# Patient Record
Sex: Female | Born: 1965 | Race: Black or African American | Hispanic: No | Marital: Single | Smoking: Never smoker
Health system: Southern US, Community
[De-identification: ages and names within clinical notes are randomized; demographics above are authoritative.]

## PROBLEM LIST (undated history)

## (undated) DIAGNOSIS — Z992 Dependence on renal dialysis: Secondary | ICD-10-CM

## (undated) DIAGNOSIS — D649 Anemia, unspecified: Secondary | ICD-10-CM

## (undated) DIAGNOSIS — I10 Essential (primary) hypertension: Secondary | ICD-10-CM

## (undated) DIAGNOSIS — N189 Chronic kidney disease, unspecified: Secondary | ICD-10-CM

## (undated) DIAGNOSIS — M069 Rheumatoid arthritis, unspecified: Secondary | ICD-10-CM

## (undated) DIAGNOSIS — Z940 Kidney transplant status: Secondary | ICD-10-CM

## (undated) HISTORY — DX: Chronic kidney disease, unspecified: N18.9

## (undated) HISTORY — PX: OTHER SURGICAL HISTORY: SHX169

## (undated) HISTORY — DX: Anemia, unspecified: D64.9

## (undated) HISTORY — DX: Rheumatoid arthritis, unspecified: M06.9

## (undated) HISTORY — DX: Essential (primary) hypertension: I10

---

## 2001-03-15 ENCOUNTER — Ambulatory Visit (HOSPITAL_BASED_OUTPATIENT_CLINIC_OR_DEPARTMENT_OTHER): Admission: RE | Admit: 2001-03-15 | Discharge: 2001-03-15 | Payer: Self-pay | Admitting: General Surgery

## 2001-03-15 ENCOUNTER — Encounter (INDEPENDENT_AMBULATORY_CARE_PROVIDER_SITE_OTHER): Payer: Self-pay | Admitting: *Deleted

## 2001-07-30 ENCOUNTER — Encounter: Admission: RE | Admit: 2001-07-30 | Discharge: 2001-07-30 | Payer: Self-pay | Admitting: Family Medicine

## 2001-12-20 ENCOUNTER — Encounter: Admission: RE | Admit: 2001-12-20 | Discharge: 2001-12-20 | Payer: Self-pay | Admitting: Family Medicine

## 2001-12-21 ENCOUNTER — Encounter: Admission: RE | Admit: 2001-12-21 | Discharge: 2002-01-11 | Payer: Self-pay | Admitting: Orthopedic Surgery

## 2002-02-22 ENCOUNTER — Encounter: Admission: RE | Admit: 2002-02-22 | Discharge: 2002-02-22 | Payer: Self-pay | Admitting: Family Medicine

## 2002-03-18 ENCOUNTER — Encounter: Admission: RE | Admit: 2002-03-18 | Discharge: 2002-03-18 | Payer: Self-pay | Admitting: Family Medicine

## 2002-05-08 ENCOUNTER — Encounter: Admission: RE | Admit: 2002-05-08 | Discharge: 2002-05-08 | Payer: Self-pay | Admitting: Family Medicine

## 2002-07-15 ENCOUNTER — Encounter: Admission: RE | Admit: 2002-07-15 | Discharge: 2002-10-13 | Payer: Self-pay | Admitting: Orthopedic Surgery

## 2002-08-22 ENCOUNTER — Encounter: Admission: RE | Admit: 2002-08-22 | Discharge: 2002-08-22 | Payer: Self-pay | Admitting: Family Medicine

## 2002-08-26 ENCOUNTER — Encounter: Admission: RE | Admit: 2002-08-26 | Discharge: 2002-08-26 | Payer: Self-pay | Admitting: Family Medicine

## 2002-08-26 ENCOUNTER — Other Ambulatory Visit: Admission: RE | Admit: 2002-08-26 | Discharge: 2002-08-26 | Payer: Self-pay | Admitting: Family Medicine

## 2002-08-30 ENCOUNTER — Encounter (HOSPITAL_COMMUNITY): Admission: RE | Admit: 2002-08-30 | Discharge: 2002-11-28 | Payer: Self-pay | Admitting: *Deleted

## 2002-10-14 ENCOUNTER — Encounter: Admission: RE | Admit: 2002-10-14 | Discharge: 2003-01-12 | Payer: Self-pay | Admitting: Orthopedic Surgery

## 2002-11-27 ENCOUNTER — Encounter (HOSPITAL_BASED_OUTPATIENT_CLINIC_OR_DEPARTMENT_OTHER): Payer: Self-pay | Admitting: General Surgery

## 2002-11-28 ENCOUNTER — Ambulatory Visit (HOSPITAL_COMMUNITY): Admission: RE | Admit: 2002-11-28 | Discharge: 2002-11-28 | Payer: Self-pay | Admitting: General Surgery

## 2002-12-06 ENCOUNTER — Encounter (HOSPITAL_COMMUNITY): Admission: RE | Admit: 2002-12-06 | Discharge: 2003-03-06 | Payer: Self-pay | Admitting: Sports Medicine

## 2003-03-07 ENCOUNTER — Encounter (HOSPITAL_COMMUNITY): Admission: RE | Admit: 2003-03-07 | Discharge: 2003-06-05 | Payer: Self-pay | Admitting: Sports Medicine

## 2003-06-06 ENCOUNTER — Encounter (HOSPITAL_COMMUNITY): Admission: RE | Admit: 2003-06-06 | Discharge: 2003-09-04 | Payer: Self-pay | Admitting: Sports Medicine

## 2003-07-02 ENCOUNTER — Encounter: Admission: RE | Admit: 2003-07-02 | Discharge: 2003-07-02 | Payer: Self-pay | Admitting: Family Medicine

## 2003-07-29 ENCOUNTER — Encounter: Admission: RE | Admit: 2003-07-29 | Discharge: 2003-07-29 | Payer: Self-pay | Admitting: Family Medicine

## 2003-08-01 ENCOUNTER — Encounter: Admission: RE | Admit: 2003-08-01 | Discharge: 2003-08-01 | Payer: Self-pay | Admitting: Family Medicine

## 2003-08-15 ENCOUNTER — Encounter: Admission: RE | Admit: 2003-08-15 | Discharge: 2003-08-15 | Payer: Self-pay | Admitting: Family Medicine

## 2003-08-28 ENCOUNTER — Emergency Department (HOSPITAL_COMMUNITY): Admission: EM | Admit: 2003-08-28 | Discharge: 2003-08-28 | Payer: Self-pay | Admitting: Emergency Medicine

## 2003-09-12 ENCOUNTER — Encounter (HOSPITAL_COMMUNITY): Admission: RE | Admit: 2003-09-12 | Discharge: 2003-12-11 | Payer: Self-pay | Admitting: Vascular Surgery

## 2003-09-18 ENCOUNTER — Other Ambulatory Visit: Admission: RE | Admit: 2003-09-18 | Discharge: 2003-09-18 | Payer: Self-pay | Admitting: Family Medicine

## 2003-09-18 ENCOUNTER — Encounter: Admission: RE | Admit: 2003-09-18 | Discharge: 2003-09-18 | Payer: Self-pay | Admitting: Family Medicine

## 2003-09-24 ENCOUNTER — Encounter: Admission: RE | Admit: 2003-09-24 | Discharge: 2003-09-24 | Payer: Self-pay | Admitting: Family Medicine

## 2004-03-29 ENCOUNTER — Encounter: Admission: RE | Admit: 2004-03-29 | Discharge: 2004-03-29 | Payer: Self-pay | Admitting: Family Medicine

## 2004-06-14 ENCOUNTER — Emergency Department (HOSPITAL_COMMUNITY): Admission: EM | Admit: 2004-06-14 | Discharge: 2004-06-15 | Payer: Self-pay | Admitting: Emergency Medicine

## 2004-06-17 ENCOUNTER — Encounter: Admission: RE | Admit: 2004-06-17 | Discharge: 2004-06-17 | Payer: Self-pay | Admitting: Family Medicine

## 2004-08-13 ENCOUNTER — Ambulatory Visit: Payer: Self-pay | Admitting: Family Medicine

## 2004-09-10 ENCOUNTER — Other Ambulatory Visit: Admission: RE | Admit: 2004-09-10 | Discharge: 2004-09-10 | Payer: Self-pay | Admitting: Family Medicine

## 2004-09-10 ENCOUNTER — Ambulatory Visit: Payer: Self-pay | Admitting: Sports Medicine

## 2004-12-21 ENCOUNTER — Ambulatory Visit: Payer: Self-pay | Admitting: Family Medicine

## 2004-12-23 ENCOUNTER — Ambulatory Visit: Payer: Self-pay | Admitting: Family Medicine

## 2004-12-27 ENCOUNTER — Ambulatory Visit: Payer: Self-pay | Admitting: Family Medicine

## 2005-01-06 ENCOUNTER — Ambulatory Visit: Payer: Self-pay | Admitting: Sports Medicine

## 2005-01-19 ENCOUNTER — Ambulatory Visit: Payer: Self-pay | Admitting: Family Medicine

## 2005-02-18 ENCOUNTER — Ambulatory Visit: Payer: Self-pay | Admitting: Family Medicine

## 2005-08-18 ENCOUNTER — Ambulatory Visit: Payer: Self-pay | Admitting: Family Medicine

## 2005-08-19 ENCOUNTER — Ambulatory Visit: Payer: Self-pay | Admitting: Family Medicine

## 2005-08-19 ENCOUNTER — Ambulatory Visit: Payer: Self-pay | Admitting: Cardiology

## 2005-08-19 ENCOUNTER — Inpatient Hospital Stay (HOSPITAL_COMMUNITY): Admission: EM | Admit: 2005-08-19 | Discharge: 2005-08-25 | Payer: Self-pay | Admitting: Emergency Medicine

## 2005-08-19 ENCOUNTER — Encounter: Payer: Self-pay | Admitting: Cardiology

## 2005-09-30 ENCOUNTER — Ambulatory Visit: Payer: Self-pay | Admitting: Family Medicine

## 2005-10-26 ENCOUNTER — Ambulatory Visit: Payer: Self-pay | Admitting: Family Medicine

## 2005-11-07 ENCOUNTER — Ambulatory Visit (HOSPITAL_COMMUNITY): Admission: RE | Admit: 2005-11-07 | Discharge: 2005-11-07 | Payer: Self-pay | Admitting: Vascular Surgery

## 2005-12-15 ENCOUNTER — Ambulatory Visit: Payer: Self-pay | Admitting: Family Medicine

## 2006-03-29 ENCOUNTER — Ambulatory Visit (HOSPITAL_COMMUNITY): Admission: RE | Admit: 2006-03-29 | Discharge: 2006-03-29 | Payer: Self-pay

## 2006-04-03 ENCOUNTER — Encounter: Admission: RE | Admit: 2006-04-03 | Discharge: 2006-04-03 | Payer: Self-pay | Admitting: Nephrology

## 2006-05-30 ENCOUNTER — Encounter: Admission: RE | Admit: 2006-05-30 | Discharge: 2006-05-30 | Payer: Self-pay | Admitting: Nephrology

## 2006-12-08 ENCOUNTER — Ambulatory Visit: Payer: Self-pay | Admitting: Family Medicine

## 2006-12-22 ENCOUNTER — Encounter (INDEPENDENT_AMBULATORY_CARE_PROVIDER_SITE_OTHER): Payer: Self-pay | Admitting: *Deleted

## 2006-12-22 LAB — CONVERTED CEMR LAB

## 2006-12-29 ENCOUNTER — Ambulatory Visit: Payer: Self-pay | Admitting: Family Medicine

## 2006-12-29 ENCOUNTER — Encounter (INDEPENDENT_AMBULATORY_CARE_PROVIDER_SITE_OTHER): Payer: Self-pay | Admitting: Family Medicine

## 2007-01-18 DIAGNOSIS — N912 Amenorrhea, unspecified: Secondary | ICD-10-CM | POA: Insufficient documentation

## 2007-01-18 DIAGNOSIS — K5900 Constipation, unspecified: Secondary | ICD-10-CM | POA: Insufficient documentation

## 2007-01-18 DIAGNOSIS — D649 Anemia, unspecified: Secondary | ICD-10-CM | POA: Insufficient documentation

## 2007-01-18 DIAGNOSIS — J309 Allergic rhinitis, unspecified: Secondary | ICD-10-CM | POA: Insufficient documentation

## 2007-01-18 DIAGNOSIS — M329 Systemic lupus erythematosus, unspecified: Secondary | ICD-10-CM | POA: Insufficient documentation

## 2007-01-19 ENCOUNTER — Encounter (INDEPENDENT_AMBULATORY_CARE_PROVIDER_SITE_OTHER): Payer: Self-pay | Admitting: *Deleted

## 2007-04-26 ENCOUNTER — Encounter (INDEPENDENT_AMBULATORY_CARE_PROVIDER_SITE_OTHER): Payer: Self-pay | Admitting: Family Medicine

## 2007-04-26 ENCOUNTER — Encounter: Admission: RE | Admit: 2007-04-26 | Discharge: 2007-04-26 | Payer: Self-pay | Admitting: Nephrology

## 2007-05-09 ENCOUNTER — Encounter (INDEPENDENT_AMBULATORY_CARE_PROVIDER_SITE_OTHER): Payer: Self-pay | Admitting: Family Medicine

## 2008-02-20 ENCOUNTER — Encounter: Payer: Self-pay | Admitting: Family Medicine

## 2008-02-22 ENCOUNTER — Encounter: Payer: Self-pay | Admitting: Family Medicine

## 2008-02-26 ENCOUNTER — Encounter: Payer: Self-pay | Admitting: Family Medicine

## 2008-02-28 ENCOUNTER — Encounter: Payer: Self-pay | Admitting: Family Medicine

## 2008-03-03 ENCOUNTER — Encounter: Payer: Self-pay | Admitting: Family Medicine

## 2008-03-04 ENCOUNTER — Encounter: Payer: Self-pay | Admitting: Family Medicine

## 2008-03-06 ENCOUNTER — Encounter: Payer: Self-pay | Admitting: Family Medicine

## 2008-03-07 ENCOUNTER — Telehealth (INDEPENDENT_AMBULATORY_CARE_PROVIDER_SITE_OTHER): Payer: Self-pay | Admitting: Family Medicine

## 2008-03-10 ENCOUNTER — Encounter: Payer: Self-pay | Admitting: Family Medicine

## 2008-03-11 ENCOUNTER — Ambulatory Visit: Payer: Self-pay | Admitting: Family Medicine

## 2008-03-11 ENCOUNTER — Encounter: Payer: Self-pay | Admitting: Family Medicine

## 2008-03-12 ENCOUNTER — Encounter: Payer: Self-pay | Admitting: Family Medicine

## 2008-03-21 ENCOUNTER — Encounter: Payer: Self-pay | Admitting: Family Medicine

## 2008-03-31 ENCOUNTER — Encounter: Payer: Self-pay | Admitting: Family Medicine

## 2008-04-03 ENCOUNTER — Encounter: Payer: Self-pay | Admitting: Family Medicine

## 2008-04-07 ENCOUNTER — Encounter: Payer: Self-pay | Admitting: Family Medicine

## 2008-04-10 ENCOUNTER — Encounter: Payer: Self-pay | Admitting: Family Medicine

## 2008-04-22 ENCOUNTER — Encounter: Payer: Self-pay | Admitting: Family Medicine

## 2008-04-25 ENCOUNTER — Encounter: Payer: Self-pay | Admitting: Family Medicine

## 2008-05-08 ENCOUNTER — Encounter: Payer: Self-pay | Admitting: Family Medicine

## 2008-05-12 ENCOUNTER — Encounter: Payer: Self-pay | Admitting: Family Medicine

## 2008-05-21 ENCOUNTER — Encounter: Payer: Self-pay | Admitting: Family Medicine

## 2008-05-22 ENCOUNTER — Encounter: Payer: Self-pay | Admitting: Family Medicine

## 2008-05-27 ENCOUNTER — Encounter: Payer: Self-pay | Admitting: Family Medicine

## 2008-05-30 ENCOUNTER — Encounter: Payer: Self-pay | Admitting: Family Medicine

## 2008-06-20 ENCOUNTER — Encounter: Payer: Self-pay | Admitting: Family Medicine

## 2008-07-24 ENCOUNTER — Encounter: Payer: Self-pay | Admitting: Family Medicine

## 2008-07-31 ENCOUNTER — Encounter: Payer: Self-pay | Admitting: Family Medicine

## 2008-08-07 ENCOUNTER — Encounter: Payer: Self-pay | Admitting: Family Medicine

## 2008-10-15 ENCOUNTER — Encounter: Admission: RE | Admit: 2008-10-15 | Discharge: 2008-10-15 | Payer: Self-pay | Admitting: Nephrology

## 2008-12-22 ENCOUNTER — Encounter (HOSPITAL_COMMUNITY): Admission: RE | Admit: 2008-12-22 | Discharge: 2009-03-22 | Payer: Self-pay | Admitting: Nephrology

## 2009-02-06 ENCOUNTER — Encounter: Payer: Self-pay | Admitting: Family Medicine

## 2009-04-02 ENCOUNTER — Encounter (HOSPITAL_COMMUNITY): Admission: RE | Admit: 2009-04-02 | Discharge: 2009-07-01 | Payer: Self-pay | Admitting: Nephrology

## 2009-04-17 ENCOUNTER — Encounter: Admission: RE | Admit: 2009-04-17 | Discharge: 2009-04-17 | Payer: Self-pay | Admitting: Nephrology

## 2009-07-06 ENCOUNTER — Encounter (HOSPITAL_COMMUNITY): Admission: RE | Admit: 2009-07-06 | Discharge: 2009-10-04 | Payer: Self-pay | Admitting: Nephrology

## 2009-08-14 ENCOUNTER — Encounter: Payer: Self-pay | Admitting: Family Medicine

## 2009-10-12 ENCOUNTER — Encounter (HOSPITAL_COMMUNITY): Admission: RE | Admit: 2009-10-12 | Discharge: 2010-01-08 | Payer: Self-pay | Admitting: Nephrology

## 2010-01-11 ENCOUNTER — Encounter (HOSPITAL_COMMUNITY): Admission: RE | Admit: 2010-01-11 | Discharge: 2010-04-11 | Payer: Self-pay | Admitting: Nephrology

## 2010-02-01 ENCOUNTER — Encounter: Payer: Self-pay | Admitting: Family Medicine

## 2010-04-22 ENCOUNTER — Encounter (HOSPITAL_COMMUNITY): Admission: RE | Admit: 2010-04-22 | Discharge: 2010-07-21 | Payer: Self-pay | Admitting: Nephrology

## 2010-07-22 ENCOUNTER — Encounter (HOSPITAL_COMMUNITY): Admission: RE | Admit: 2010-07-22 | Discharge: 2010-10-20 | Payer: Self-pay | Admitting: Nephrology

## 2010-10-28 ENCOUNTER — Encounter (HOSPITAL_COMMUNITY)
Admission: RE | Admit: 2010-10-28 | Discharge: 2010-12-21 | Payer: Self-pay | Source: Home / Self Care | Attending: Nephrology | Admitting: Nephrology

## 2010-12-12 ENCOUNTER — Encounter: Payer: Self-pay | Admitting: Nephrology

## 2010-12-13 ENCOUNTER — Other Ambulatory Visit: Payer: Self-pay | Admitting: Nephrology

## 2010-12-13 DIAGNOSIS — Z1239 Encounter for other screening for malignant neoplasm of breast: Secondary | ICD-10-CM

## 2010-12-15 LAB — POCT HEMOGLOBIN-HEMACUE: Hemoglobin: 10.8 g/dL — ABNORMAL LOW (ref 12.0–15.0)

## 2010-12-21 NOTE — Consult Note (Signed)
Summary: [facility]   Imported By: Beryle Lathe 02/03/2010 17:18:36  _____________________________________________________________________  External Attachment:    Type:   Image     Comment:   External Document

## 2010-12-24 ENCOUNTER — Ambulatory Visit: Payer: Self-pay

## 2010-12-30 ENCOUNTER — Other Ambulatory Visit: Payer: Self-pay

## 2010-12-30 ENCOUNTER — Encounter (HOSPITAL_COMMUNITY): Payer: Medicare Other | Attending: Nephrology

## 2010-12-30 DIAGNOSIS — N184 Chronic kidney disease, stage 4 (severe): Secondary | ICD-10-CM | POA: Insufficient documentation

## 2010-12-30 DIAGNOSIS — D638 Anemia in other chronic diseases classified elsewhere: Secondary | ICD-10-CM | POA: Insufficient documentation

## 2010-12-30 LAB — POCT HEMOGLOBIN-HEMACUE: Hemoglobin: 11.5 g/dL — ABNORMAL LOW (ref 12.0–15.0)

## 2011-01-13 ENCOUNTER — Encounter (HOSPITAL_COMMUNITY): Payer: Medicare Other

## 2011-01-13 ENCOUNTER — Other Ambulatory Visit: Payer: Self-pay

## 2011-01-27 ENCOUNTER — Encounter (HOSPITAL_COMMUNITY): Payer: Medicare Other

## 2011-01-28 ENCOUNTER — Encounter (HOSPITAL_COMMUNITY): Payer: Medicare Other | Attending: Nephrology

## 2011-01-28 ENCOUNTER — Other Ambulatory Visit: Payer: Self-pay

## 2011-01-28 DIAGNOSIS — N184 Chronic kidney disease, stage 4 (severe): Secondary | ICD-10-CM | POA: Insufficient documentation

## 2011-01-28 DIAGNOSIS — D638 Anemia in other chronic diseases classified elsewhere: Secondary | ICD-10-CM | POA: Insufficient documentation

## 2011-01-31 LAB — POCT HEMOGLOBIN-HEMACUE
Hemoglobin: 11.9 g/dL — ABNORMAL LOW (ref 12.0–15.0)
Hemoglobin: 11.7 g/dL — ABNORMAL LOW (ref 12.0–15.0)

## 2011-02-02 LAB — POCT HEMOGLOBIN-HEMACUE: Hemoglobin: 10.9 g/dL — ABNORMAL LOW (ref 12.0–15.0)

## 2011-02-03 LAB — POCT HEMOGLOBIN-HEMACUE
Hemoglobin: 12 g/dL (ref 12.0–15.0)
Hemoglobin: 11.1 g/dL — ABNORMAL LOW (ref 12.0–15.0)

## 2011-02-05 LAB — COMPREHENSIVE METABOLIC PANEL
ALT: 16 U/L (ref 0–35)
AST: 25 U/L (ref 0–37)
Alkaline Phosphatase: 39 U/L (ref 39–117)
CO2: 25 mEq/L (ref 19–32)
Chloride: 104 mEq/L (ref 96–112)
Creatinine, Ser: 3.4 mg/dL — ABNORMAL HIGH (ref 0.4–1.2)
GFR calc Af Amer: 18 mL/min — ABNORMAL LOW (ref >60)
GFR calc non Af Amer: 15 mL/min — ABNORMAL LOW (ref >60)
Total Bilirubin: 0.6 mg/dL (ref 0.3–1.2)

## 2011-02-06 LAB — POCT HEMOGLOBIN-HEMACUE: Hemoglobin: 10.8 g/dL — ABNORMAL LOW (ref 12.0–15.0)

## 2011-02-08 LAB — POCT HEMOGLOBIN-HEMACUE
Hemoglobin: 10 g/dL — ABNORMAL LOW (ref 12.0–15.0)
Hemoglobin: 9.9 g/dL — ABNORMAL LOW (ref 12.0–15.0)

## 2011-02-09 LAB — POCT HEMOGLOBIN-HEMACUE
Hemoglobin: 10.8 g/dL — ABNORMAL LOW (ref 12.0–15.0)
Hemoglobin: 11 g/dL — ABNORMAL LOW (ref 12.0–15.0)
Hemoglobin: 13.8 g/dL (ref 12.0–15.0)

## 2011-02-10 ENCOUNTER — Encounter (HOSPITAL_COMMUNITY): Payer: Medicare Other

## 2011-02-10 ENCOUNTER — Other Ambulatory Visit: Payer: Self-pay | Admitting: Nephrology

## 2011-02-11 LAB — POCT HEMOGLOBIN-HEMACUE: Hemoglobin: 10.4 g/dL — ABNORMAL LOW (ref 12.0–15.0)

## 2011-02-16 LAB — HEMOGLOBIN A1C: Hgb A1c MFr Bld: 4.8 % (ref 4.0–6.0)

## 2011-02-17 ENCOUNTER — Encounter: Payer: Self-pay | Admitting: Family Medicine

## 2011-02-21 LAB — POCT HEMOGLOBIN-HEMACUE: Hemoglobin: 10.2 g/dL — ABNORMAL LOW (ref 12.0–15.0)

## 2011-02-24 ENCOUNTER — Encounter (HOSPITAL_COMMUNITY): Payer: Medicare Other | Attending: Nephrology

## 2011-02-24 ENCOUNTER — Other Ambulatory Visit: Payer: Self-pay | Admitting: Nephrology

## 2011-02-24 DIAGNOSIS — N184 Chronic kidney disease, stage 4 (severe): Secondary | ICD-10-CM | POA: Insufficient documentation

## 2011-02-24 DIAGNOSIS — D638 Anemia in other chronic diseases classified elsewhere: Secondary | ICD-10-CM | POA: Insufficient documentation

## 2011-02-24 LAB — IRON AND TIBC
Iron: 51 ug/dL (ref 42–135)
Saturation Ratios: 28 % (ref 20–55)
TIBC: 183 ug/dL — ABNORMAL LOW (ref 250–470)
UIBC: 132 ug/dL

## 2011-02-24 LAB — FERRITIN: Ferritin: 413 ng/mL — ABNORMAL HIGH (ref 10–291)

## 2011-02-24 LAB — POCT HEMOGLOBIN-HEMACUE: Hemoglobin: 10.7 g/dL — ABNORMAL LOW (ref 12.0–15.0)

## 2011-02-25 LAB — EPSTEIN BARR VIRUS(EBV) BY PCR: EBV DNA, PCR: NOT DETECTED

## 2011-02-25 LAB — PROTEIN / CREATININE RATIO, URINE
Protein Creatinine Ratio: 0.77 — ABNORMAL HIGH (ref 0.00–0.15)
Total Protein, Urine: 95 mg/dL

## 2011-02-25 LAB — CYTOMEGALOVIRUS PCR, QUALITATIVE: Cytomegalovirus DNA: NOT DETECTED

## 2011-02-25 LAB — SIROLIMUS LEVEL: Sirolimus (Rapamycin): 4.9 (ref 3.0–18.0)

## 2011-02-25 LAB — CBC
HCT: 35.2 % — ABNORMAL LOW (ref 36.0–46.0)
MCHC: 32.7 g/dL (ref 30.0–36.0)
MCV: 86.3 fL (ref 78.0–100.0)
Platelets: 228 10*3/uL (ref 150–400)
RDW: 14.5 % (ref 11.5–15.5)
WBC: 2.8 10*3/uL — ABNORMAL LOW (ref 4.0–10.5)

## 2011-02-25 LAB — URINALYSIS, ROUTINE W REFLEX MICROSCOPIC
Bilirubin Urine: NEGATIVE
Ketones, ur: NEGATIVE mg/dL
Nitrite: NEGATIVE
Protein, ur: 100 mg/dL — AB
Urobilinogen, UA: 0.2 mg/dL (ref 0.0–1.0)

## 2011-02-25 LAB — DIFFERENTIAL
Basophils Absolute: 0 10*3/uL (ref 0.0–0.1)
Lymphocytes Relative: 13 % (ref 12–46)
Lymphs Abs: 0.4 10*3/uL — ABNORMAL LOW (ref 0.7–4.0)
Monocytes Absolute: 0.1 10*3/uL (ref 0.1–1.0)
Neutro Abs: 2.2 10*3/uL (ref 1.7–7.7)

## 2011-02-25 LAB — COMPREHENSIVE METABOLIC PANEL
Albumin: 3.6 g/dL (ref 3.5–5.2)
BUN: 28 mg/dL — ABNORMAL HIGH (ref 6–23)
Calcium: 8.9 mg/dL (ref 8.4–10.5)
Chloride: 106 mEq/L (ref 96–112)
Creatinine, Ser: 3.03 mg/dL — ABNORMAL HIGH (ref 0.4–1.2)
GFR calc Af Amer: 20 mL/min — ABNORMAL LOW (ref >60)
Total Bilirubin: 0.6 mg/dL (ref 0.3–1.2)

## 2011-02-25 LAB — LIPID PANEL
Cholesterol: 174 mg/dL (ref 0–200)
HDL: 57 mg/dL (ref >39)
LDL Cholesterol: 93 mg/dL (ref 0–99)

## 2011-02-25 LAB — FERRITIN: Ferritin: 485 ng/mL — ABNORMAL HIGH (ref 10–291)

## 2011-02-25 LAB — MISCELLANEOUS TEST

## 2011-02-26 LAB — CBC
HCT: 33.4 % — ABNORMAL LOW (ref 36.0–46.0)
Hemoglobin: 11 g/dL — ABNORMAL LOW (ref 12.0–15.0)
Platelets: 243 10*3/uL (ref 150–400)
RBC: 3.87 MIL/uL (ref 3.87–5.11)
WBC: 2.9 10*3/uL — ABNORMAL LOW (ref 4.0–10.5)

## 2011-02-26 LAB — COMPREHENSIVE METABOLIC PANEL
Albumin: 3.8 g/dL (ref 3.5–5.2)
Alkaline Phosphatase: 34 U/L — ABNORMAL LOW (ref 39–117)
BUN: 25 mg/dL — ABNORMAL HIGH (ref 6–23)
Chloride: 107 mEq/L (ref 96–112)
Glucose, Bld: 77 mg/dL (ref 70–99)
Potassium: 4 mEq/L (ref 3.5–5.1)
Total Bilirubin: 0.8 mg/dL (ref 0.3–1.2)

## 2011-02-26 LAB — FERRITIN: Ferritin: 484 ng/mL — ABNORMAL HIGH (ref 10–291)

## 2011-02-26 LAB — URINALYSIS, ROUTINE W REFLEX MICROSCOPIC
Bilirubin Urine: NEGATIVE
Glucose, UA: NEGATIVE mg/dL
Hgb urine dipstick: NEGATIVE
Ketones, ur: NEGATIVE mg/dL
Protein, ur: 100 mg/dL — AB
Urobilinogen, UA: 1 mg/dL (ref 0.0–1.0)

## 2011-02-26 LAB — IRON AND TIBC
Iron: 54 ug/dL (ref 42–135)
Saturation Ratios: 32 % (ref 20–55)
UIBC: 117 ug/dL

## 2011-02-26 LAB — LIPID PANEL: Triglycerides: 106 mg/dL (ref <150)

## 2011-02-26 LAB — MISCELLANEOUS TEST

## 2011-02-26 LAB — DIFFERENTIAL
Basophils Relative: 1 % (ref 0–1)
Lymphs Abs: 0.4 10*3/uL — ABNORMAL LOW (ref 0.7–4.0)
Monocytes Absolute: 0.1 10*3/uL (ref 0.1–1.0)
Monocytes Relative: 5 % (ref 3–12)
Neutro Abs: 2.3 10*3/uL (ref 1.7–7.7)

## 2011-02-26 LAB — SIROLIMUS LEVEL: Sirolimus (Rapamycin): 6.4 (ref 3.0–18.0)

## 2011-02-26 LAB — PTH, INTACT AND CALCIUM
Calcium, Total (PTH): 8.7 mg/dL (ref 8.4–10.5)
PTH: 432.5 pg/mL — ABNORMAL HIGH (ref 14.0–72.0)

## 2011-02-26 LAB — CYTOMEGALOVIRUS PCR, QUALITATIVE: Cytomegalovirus DNA: NOT DETECTED

## 2011-02-26 LAB — PROTEIN / CREATININE RATIO, URINE
Creatinine, Urine: 145.7 mg/dL
Total Protein, Urine: 112 mg/dL

## 2011-02-26 LAB — URINE MICROSCOPIC-ADD ON

## 2011-02-27 LAB — CBC
HCT: 31.4 % — ABNORMAL LOW (ref 36.0–46.0)
MCHC: 31.9 g/dL (ref 30.0–36.0)
MCV: 86.7 fL (ref 78.0–100.0)
Platelets: 223 10*3/uL (ref 150–400)

## 2011-02-27 LAB — URINALYSIS, ROUTINE W REFLEX MICROSCOPIC
Glucose, UA: NEGATIVE mg/dL
Leukocytes, UA: NEGATIVE
Nitrite: NEGATIVE
Specific Gravity, Urine: 1.016 (ref 1.005–1.030)
pH: 6.5 (ref 5.0–8.0)

## 2011-02-27 LAB — URINE MICROSCOPIC-ADD ON

## 2011-02-27 LAB — POCT HEMOGLOBIN-HEMACUE: Hemoglobin: 8.7 g/dL — ABNORMAL LOW (ref 12.0–15.0)

## 2011-02-27 LAB — COMPREHENSIVE METABOLIC PANEL
AST: 17 U/L (ref 0–37)
Albumin: 3.8 g/dL (ref 3.5–5.2)
BUN: 37 mg/dL — ABNORMAL HIGH (ref 6–23)
CO2: 25 mEq/L (ref 19–32)
Calcium: 9.3 mg/dL (ref 8.4–10.5)
Chloride: 106 mEq/L (ref 96–112)
Creatinine, Ser: 3.15 mg/dL — ABNORMAL HIGH (ref 0.4–1.2)
GFR calc Af Amer: 20 mL/min — ABNORMAL LOW (ref >60)
GFR calc non Af Amer: 16 mL/min — ABNORMAL LOW (ref >60)
Total Bilirubin: 0.3 mg/dL (ref 0.3–1.2)

## 2011-02-27 LAB — DIFFERENTIAL
Basophils Absolute: 0 10*3/uL (ref 0.0–0.1)
Basophils Relative: 0 % (ref 0–1)
Lymphocytes Relative: 12 % (ref 12–46)
Monocytes Absolute: 0.1 10*3/uL (ref 0.1–1.0)
Neutro Abs: 3.5 10*3/uL (ref 1.7–7.7)
Neutrophils Relative %: 83 % — ABNORMAL HIGH (ref 43–77)

## 2011-02-27 LAB — EPSTEIN BARR VIRUS(EBV) BY PCR

## 2011-02-27 LAB — MISCELLANEOUS TEST

## 2011-02-27 LAB — IRON AND TIBC
Iron: 65 ug/dL (ref 42–135)
TIBC: 202 ug/dL — ABNORMAL LOW (ref 250–470)

## 2011-02-28 LAB — COMPREHENSIVE METABOLIC PANEL
AST: 43 U/L — ABNORMAL HIGH (ref 0–37)
CO2: 15 mEq/L — ABNORMAL LOW (ref 19–32)
Calcium: 8.9 mg/dL (ref 8.4–10.5)
Creatinine, Ser: 3.57 mg/dL — ABNORMAL HIGH (ref 0.4–1.2)
GFR calc Af Amer: 17 mL/min — ABNORMAL LOW (ref >60)
GFR calc non Af Amer: 14 mL/min — ABNORMAL LOW (ref >60)

## 2011-02-28 LAB — IRON AND TIBC: Saturation Ratios: 16 % — ABNORMAL LOW (ref 20–55)

## 2011-02-28 LAB — URINALYSIS, ROUTINE W REFLEX MICROSCOPIC
Protein, ur: 100 mg/dL — AB
Urobilinogen, UA: 0.2 mg/dL (ref 0.0–1.0)

## 2011-02-28 LAB — HEMOGLOBIN A1C
Hgb A1c MFr Bld: 5.3 % (ref 4.6–6.1)
Mean Plasma Glucose: 105 mg/dL

## 2011-02-28 LAB — DIFFERENTIAL
Basophils Absolute: 0 10*3/uL (ref 0.0–0.1)
Basophils Relative: 0 % (ref 0–1)
Eosinophils Absolute: 0.2 10*3/uL (ref 0.0–0.7)
Neutrophils Relative %: 84 % — ABNORMAL HIGH (ref 43–77)

## 2011-02-28 LAB — CYTOMEGALOVIRUS ANTIBODY, IGG: Cytomegalovirus(CMV) Antibody, IgG: POSITIVE — AB

## 2011-02-28 LAB — SIROLIMUS LEVEL: Sirolimus (Rapamycin): 6.5 (ref 3.0–18.0)

## 2011-02-28 LAB — LIPID PANEL
HDL: 48 mg/dL (ref >39)
Triglycerides: 223 mg/dL — ABNORMAL HIGH (ref <150)

## 2011-02-28 LAB — CBC
MCHC: 33 g/dL (ref 30.0–36.0)
MCV: 83.5 fL (ref 78.0–100.0)
Platelets: 317 10*3/uL (ref 150–400)
WBC: 3.6 10*3/uL — ABNORMAL LOW (ref 4.0–10.5)

## 2011-02-28 LAB — VITAMIN D 25 HYDROXY (VIT D DEFICIENCY, FRACTURES): Vit D, 25-Hydroxy: 14 ng/mL — ABNORMAL LOW (ref 30–89)

## 2011-02-28 LAB — MISCELLANEOUS TEST

## 2011-02-28 LAB — URINE MICROSCOPIC-ADD ON

## 2011-02-28 LAB — EPSTEIN BARR VIRUS(EBV) BY PCR: EBV DNA, PCR: NOT DETECTED

## 2011-03-01 LAB — CBC
MCHC: 32.9 g/dL (ref 30.0–36.0)
Platelets: 257 10*3/uL (ref 150–400)
RBC: 3.94 MIL/uL (ref 3.87–5.11)
WBC: 3.4 10*3/uL — ABNORMAL LOW (ref 4.0–10.5)

## 2011-03-01 LAB — COMPREHENSIVE METABOLIC PANEL
ALT: 17 U/L (ref 0–35)
AST: 22 U/L (ref 0–37)
Albumin: 3.6 g/dL (ref 3.5–5.2)
CO2: 23 mEq/L (ref 19–32)
Calcium: 9.1 mg/dL (ref 8.4–10.5)
Chloride: 107 mEq/L (ref 96–112)
GFR calc Af Amer: 23 mL/min — ABNORMAL LOW (ref >60)
GFR calc non Af Amer: 19 mL/min — ABNORMAL LOW (ref >60)
Sodium: 136 mEq/L (ref 135–145)

## 2011-03-01 LAB — CYTOMEGALOVIRUS PCR, QUALITATIVE: Cytomegalovirus DNA: NOT DETECTED

## 2011-03-01 LAB — URINALYSIS, MICROSCOPIC ONLY
Leukocytes, UA: NEGATIVE
Nitrite: NEGATIVE
Protein, ur: >300 mg/dL — AB
Specific Gravity, Urine: 1.016 (ref 1.005–1.030)
Urobilinogen, UA: 0.2 mg/dL (ref 0.0–1.0)

## 2011-03-01 LAB — DIFFERENTIAL
Eosinophils Absolute: 0.1 10*3/uL (ref 0.0–0.7)
Eosinophils Relative: 2 % (ref 0–5)
Lymphs Abs: 0.3 10*3/uL — ABNORMAL LOW (ref 0.7–4.0)
Monocytes Absolute: 0.2 10*3/uL (ref 0.1–1.0)

## 2011-03-01 LAB — MISCELLANEOUS TEST

## 2011-03-01 LAB — EPSTEIN BARR VIRUS(EBV) BY PCR: EBV DNA, PCR: NOT DETECTED

## 2011-03-02 LAB — IRON AND TIBC
Iron: 87 ug/dL (ref 42–135)
Saturation Ratios: 43 % (ref 20–55)
TIBC: 202 ug/dL — ABNORMAL LOW (ref 250–470)

## 2011-03-02 LAB — CBC
HCT: 38.5 % (ref 36.0–46.0)
MCHC: 32.2 g/dL (ref 30.0–36.0)
MCV: 83.7 fL (ref 78.0–100.0)
Platelets: 265 10*3/uL (ref 150–400)
RDW: 15.6 % — ABNORMAL HIGH (ref 11.5–15.5)

## 2011-03-02 LAB — COMPREHENSIVE METABOLIC PANEL
AST: 22 U/L (ref 0–37)
Albumin: 4 g/dL (ref 3.5–5.2)
BUN: 24 mg/dL — ABNORMAL HIGH (ref 6–23)
Calcium: 9.5 mg/dL (ref 8.4–10.5)
Chloride: 107 mEq/L (ref 96–112)
Creatinine, Ser: 2.55 mg/dL — ABNORMAL HIGH (ref 0.4–1.2)
GFR calc Af Amer: 25 mL/min — ABNORMAL LOW (ref >60)
Total Bilirubin: 0.7 mg/dL (ref 0.3–1.2)

## 2011-03-02 LAB — PROTEIN / CREATININE RATIO, URINE
Creatinine, Urine: 125.8 mg/dL
Protein Creatinine Ratio: 1.21 — ABNORMAL HIGH (ref 0.00–0.15)
Total Protein, Urine: 152 mg/dL

## 2011-03-02 LAB — POCT HEMOGLOBIN-HEMACUE
Hemoglobin: 11.4 g/dL — ABNORMAL LOW (ref 12.0–15.0)
Hemoglobin: 11.4 g/dL — ABNORMAL LOW (ref 12.0–15.0)

## 2011-03-02 LAB — LIPID PANEL
LDL Cholesterol: 109 mg/dL — ABNORMAL HIGH (ref 0–99)
Total CHOL/HDL Ratio: 3.4 RATIO
Triglycerides: 127 mg/dL (ref <150)
VLDL: 25 mg/dL (ref 0–40)

## 2011-03-02 LAB — DIFFERENTIAL
Basophils Relative: 5 % — ABNORMAL HIGH (ref 0–1)
Eosinophils Absolute: 0.1 10*3/uL (ref 0.0–0.7)
Lymphs Abs: 0.3 10*3/uL — ABNORMAL LOW (ref 0.7–4.0)
Monocytes Relative: 3 % (ref 3–12)
Neutro Abs: 3.3 10*3/uL (ref 1.7–7.7)
Neutrophils Relative %: 82 % — ABNORMAL HIGH (ref 43–77)

## 2011-03-02 LAB — SIROLIMUS LEVEL: Sirolimus (Rapamycin): 11 (ref 3.0–18.0)

## 2011-03-02 LAB — FERRITIN: Ferritin: 552 ng/mL — ABNORMAL HIGH (ref 10–291)

## 2011-03-02 LAB — CYTOMEGALOVIRUS PCR, QUALITATIVE

## 2011-03-03 LAB — POCT HEMOGLOBIN-HEMACUE: Hemoglobin: 11.8 g/dL — ABNORMAL LOW (ref 12.0–15.0)

## 2011-03-08 LAB — DIFFERENTIAL
Eosinophils Absolute: 0.1 10*3/uL (ref 0.0–0.7)
Lymphocytes Relative: 17 % (ref 12–46)
Lymphs Abs: 0.5 10*3/uL — ABNORMAL LOW (ref 0.7–4.0)
Monocytes Relative: 6 % (ref 3–12)
Neutro Abs: 2 10*3/uL (ref 1.7–7.7)
Neutrophils Relative %: 73 % (ref 43–77)

## 2011-03-08 LAB — URINALYSIS, ROUTINE W REFLEX MICROSCOPIC
Bilirubin Urine: NEGATIVE
Leukocytes, UA: NEGATIVE
Nitrite: NEGATIVE
Specific Gravity, Urine: 1.02 (ref 1.005–1.030)
Urobilinogen, UA: 0.2 mg/dL (ref 0.0–1.0)
pH: 6.5 (ref 5.0–8.0)

## 2011-03-08 LAB — URINE MICROSCOPIC-ADD ON

## 2011-03-08 LAB — COMPREHENSIVE METABOLIC PANEL
ALT: 14 U/L (ref 0–35)
AST: 22 U/L (ref 0–37)
Alkaline Phosphatase: 40 U/L (ref 39–117)
CO2: 24 mEq/L (ref 19–32)
Calcium: 9 mg/dL (ref 8.4–10.5)
Chloride: 104 mEq/L (ref 96–112)
GFR calc Af Amer: 21 mL/min — ABNORMAL LOW (ref >60)
GFR calc non Af Amer: 18 mL/min — ABNORMAL LOW (ref >60)
Glucose, Bld: 75 mg/dL (ref 70–99)
Potassium: 3.8 mEq/L (ref 3.5–5.1)
Sodium: 135 mEq/L (ref 135–145)
Total Bilirubin: 0.3 mg/dL (ref 0.3–1.2)

## 2011-03-08 LAB — SIROLIMUS LEVEL: Sirolimus (Rapamycin): 10.5 (ref 3.0–18.0)

## 2011-03-08 LAB — CBC
HCT: 28.2 % — ABNORMAL LOW (ref 36.0–46.0)
Hemoglobin: 9.3 g/dL — ABNORMAL LOW (ref 12.0–15.0)
MCV: 85.1 fL (ref 78.0–100.0)
RBC: 3.32 MIL/uL — ABNORMAL LOW (ref 3.87–5.11)
WBC: 2.8 10*3/uL — ABNORMAL LOW (ref 4.0–10.5)

## 2011-03-08 LAB — IRON AND TIBC
Iron: 31 ug/dL — ABNORMAL LOW (ref 42–135)
UIBC: 170 ug/dL

## 2011-03-08 LAB — HEMOGLOBIN A1C: Mean Plasma Glucose: 80 mg/dL

## 2011-03-10 ENCOUNTER — Other Ambulatory Visit: Payer: Self-pay | Admitting: Nephrology

## 2011-03-10 ENCOUNTER — Encounter (HOSPITAL_COMMUNITY): Payer: Medicare Other

## 2011-03-24 ENCOUNTER — Encounter (HOSPITAL_COMMUNITY): Payer: Medicare Other | Attending: Nephrology

## 2011-03-24 ENCOUNTER — Other Ambulatory Visit: Payer: Self-pay | Admitting: Nephrology

## 2011-03-24 DIAGNOSIS — N184 Chronic kidney disease, stage 4 (severe): Secondary | ICD-10-CM | POA: Insufficient documentation

## 2011-03-24 DIAGNOSIS — D638 Anemia in other chronic diseases classified elsewhere: Secondary | ICD-10-CM | POA: Insufficient documentation

## 2011-03-24 LAB — POCT HEMOGLOBIN-HEMACUE: Hemoglobin: 12.3 g/dL (ref 12.0–15.0)

## 2011-04-07 ENCOUNTER — Encounter (HOSPITAL_COMMUNITY): Payer: Medicare Other

## 2011-04-07 ENCOUNTER — Other Ambulatory Visit: Payer: Self-pay | Admitting: Nephrology

## 2011-04-08 LAB — POCT HEMOGLOBIN-HEMACUE: Hemoglobin: 10.8 g/dL — ABNORMAL LOW (ref 12.0–15.0)

## 2011-04-08 NOTE — Op Note (Signed)
[name], [name] NO.:  [id number]   MEDICAL RECORD NO.:  [id number]          PATIENT TYPE:  INP   LOCATION:  5524                         FACILITY:  [facility]   PHYSICIAN:  Jessy Oto. Fields, MD  DATE OF BIRTH:  02/28/1966   DATE OF PROCEDURE:  DATE OF DISCHARGE:                                 OPERATIVE REPORT   PROCEDURE:  Left radial cephalic AV fistula.   PREOPERATIVE DIAGNOSIS:  Stage renal disease.   POSTOPERATIVE DIAGNOSIS:  Stage renal disease.   ANESTHESIA:  Local with IV sedation.   ASSISTANT:  Nurse.   OPERATIVE FINDINGS:  1.  2.5 mm cephalic vein.  2.  2 mm radial artery.   OPERATIVE DETAILS:  After obtaining informed consent, the patient taken to  the operating room. The patient placed in supine position on operating  table. After adequate sedation, the patient's entire left upper extremity  was prepped and draped usual sterile fashion. Local anesthesia was  infiltrated midway between the cephalic vein and the radial artery at the  wrist. Longitudinal incision was made in this location, carried down through  subcutaneous tissues down to the level of the cephalic vein. Cephalic vein  was dissected free circumferentially, small side branches ligated and  divided between silk ties. Next, a radial artery was dissected free in the  medial portion of the incision. This was controlled proximally and distally  with vessel loops. Radial artery was approximately 2 mm in diameter.  Cephalic vein was approximately 2.5 mm in diameter. Next, the patient was  given 5000 units of intravenous heparin. The distal cephalic vein was  ligated with a 3-0 silk tie and the vein transected. The vein was flushed  gently with heparinized saline. The vein accepted up to a 2.5 mm dilator.  Next the vein was marked for orientation.  Vessel loops were pulled taut on  the artery. Longitudinal arteriotomy was made. The vein was spatulated and  sewn end of vein to side of  artery using a running 7-0 Prolene suture.  Just  prior to completion of anastomosis this was fore bled, back bled and  thoroughly flushed. Anastomosis was secured, clamps were released. One  repair suture was placed. The fistula filled immediately. There was fairly  severe spasm in the proximal portion of the vein. Vein was gently injected  with 30 milligrams of papaverine. The spasm released somewhat. There was  good Doppler flow in the cephalic vein up to the midforearm. There was no  palpable thrill. It was decided at this point that spasm was the primary  reason for the absence of a thrill. At this point after hemostasis had been  obtained,  subcutaneous tissues  were reapproximated with running 3-0 Vicryl stitch. Skin was closed with 4-0  Vicryl subcuticular stitch. The patient tolerated procedure well and there  were no complications.  Instrument, sponge and needle count were correct at  the end of the case.  The patient taken to the recovery room in stable  condition.           ______________________________  Jessy Oto Oneida Alar, MD  CEF/MEDQ  D:  08/23/2005  T:  08/23/2005  Job:  [id number]

## 2011-04-08 NOTE — Op Note (Signed)
[facility]  Patient:    Kathleen [name], Kathleen [name]                        MRN: [id number] Proc. Date: 03/15/01 Adm. Date:  [id number] Attending:  Westly Pam                           Operative Report  PREOPERATIVE DIAGNOSIS:  Umbilical hernia.  POSTOPERATIVE DIAGNOSIS:  Incarcerated umbilical hernia.  PROCEDURE:  Repair of incarcerated umbilical hernia.  SURGEON:  Maia Plan. Lindon Romp, M.D.  ASSISTANT:  None.  ANESTHESIA:  General by hospital.  DESCRIPTION OF PROCEDURE:  Under good general anesthesia, skin of the abdomen was prepped and draped in the usual manner.  A curvilinear incision was made beneath the umbilicus.  The umbilical skin was dissected free from the hernia sac.  There was a piece of omentum in the hernia sac, which was irreducible. The hernia sac was opened and trimmed, and the omentum was dissected free from attachments to the hernia sac and pushed back into the abdominal cavity.  The excess sac was then trimmed away, and the defect in the umbilicus was closed with through-and-through musculofascial sutures of #1 Novofil placed in an interrupted fashion.  Four sutures were used.  Hemostasis was good.  The umbilical skin was sewn down to the anterior abdominal wall with 3-0 Vicryl. Skin was closed with subcuticular 4-0 Vicryl, and Steri-Strips were applied. Estimated blood loss minimal.  The patient received no blood and left the operating room in stable condition after sponge and needle counts were verified. DD:  03/15/01 TD:  03/16/01 Job: [id number]

## 2011-04-08 NOTE — Consult Note (Signed)
Kathleen [name], Kathleen [name] NO.:  [id number]   MEDICAL RECORD NO.:  [id number]          PATIENT TYPE:  INP   LOCATION:  3302                         FACILITY:  [facility]   PHYSICIAN:  Caren Griffins B. Lorrene Reid, M.D.DATE OF BIRTH:  Jan 05, 1966   DATE OF CONSULTATION:  08/19/2005  DATE OF DISCHARGE:                                   CONSULTATION   REASON FOR CONSULTATION:  Renal failure.   This is a 45 year old black female with a past medical history significant  for rheumatoid arthritis, hypertension (never treated due to patient's  resistance), and chronic nonsteroidal anti-inflammatory use.  She has had  CKD as far back as 2004 when she had a creatinine of 1.5 with proteinuria  and hematuria.  Creatinine was 3.7 in January of 2006.  She was evaluated in  February of 2006 by Dr. Marval Regal in our office and at that time had 2.2 g  of proteinuria, a GFR of around 22 mL/minute, prior negative SPEP, and  positive ANA and double-stranded DNA.  She refused renal biopsy for  diagnostic purposes, refused EPO for anemia, declined medications for  hypertension and hyperparathyroidism, and since that time has not seen a  physician, but has been using various herbal and homeopathic remedies.  She  presents now with shortness of breath, nausea, vomiting, severe anemia,  severe hypertension, and uremic symptoms with a serum creatinine of 11.9.   PAST MEDICAL HISTORY:  1.  Seropositive deforming rheumatoid arthritis.  2.  Chronic nonsteroidal use, none since February of 2006.  3.  Hypertension.  4.  History of umbilical hernia repair.  5.  Anemia with refusal of Epogen in the past.  6.  Secondary hyperparathyroidism with a PTH of 631 in February 2006.  Has      refused vitamin D in the past.   She has no known drug allergies.   CURRENT MEDICATIONS:  IV nitroglycerin, Lovenox, and labetalol.  She is  status post 40 mg of Lasix x1 and Phenergan 12.5 mg IV x1.   OUTPATIENT MEDICATIONS:   Homeopathic remedies and vitamins and black strap  molasses.  She has not used nonsteroidals since February of 2006.   FAMILY HISTORY:  Noncontributory.  Specifically, there is no history of  renal disease, although there is a history of anemia and diabetes.   SOCIAL HISTORY:  She is separated and has two children.  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  She does not use alcohol or tobacco and has no  transfusion history.   REVIEW OF SYSTEMS:  Positive for three weeks of fatigue and exertional  dyspnea with PND and orthopnea.  She has had nausea and vomiting for three  days with anorexia.  She has had a headache since starting IV nitroglycerin.  There has been no abdominal pain, bleeding, syncope, seizure, or visual  changes.  She has had some swelling in her feet and legs.   PHYSICAL EXAMINATION:  GENERAL:  She is a slight black female who complains  of nausea and shortness  of breath.  VITAL SIGNS:  Blood pressure 189/133, heart rate in the 70s.  HEENT:  Pupils are equal, round, and reactive to light and accommodation.  She has JVD to the jaw angle, but no carotid bruits.  LUNGS:  There are crackles at both lung bases.  CARDIAC:  S1, S2, positive S4, soft S3.  2/6 murmur across the precordium.  She has an umbilical hernia above the level of her previous repair.  ABDOMEN:  There is no abdominal bruit and no abdominal tenderness.  EXTREMITIES:  There is trace edema of the lower extremities and she has  joint deformities in both of her hands.   ADMISSION LABORATORIES:  Sodium 133, potassium 5, chloride 107, CO2 16, BUN  69, creatinine 10.7, calcium 7.2.  Albumin 2.8, phosphorous 6.4, transferrin  saturation 8%.  Urinalysis 100 mg percent protein, 7-10 white cells, 3-6 red  cells, hemoglobin 6.5, platelets 345.  Stool occult blood was negative.  Troponins were 0.21 and 0.13.  Repeat BNP this morning:  Sodium  132,  potassium 6.3, chloride 107, CO2 16, BUN 73, creatinine 11.2, and calcium  6.7.  Renal ultrasound reveals markedly echogenic kidneys measuring 9.9 and  8.1 cm bilaterally.  Chest x-ray shows cardiomegaly with interstitial edema.   IMPRESSION:  14.  A 45 year old black female with history of progressive renal failure in      the setting of long-standing proteinuria and hematuria with a serum      creatinine of 1.5 in 2004, 3.6 in early 2006, and now over 10 with      echogenic kidneys on ultrasound.  It is unlikely that biopsy would      provide helpful information at this point (she refused renal biopsy in      the past).  She is clinically uremic and needs to start dialysis and is      in agreement with this plan.  Would recommend calling [facility] for Perm-Cath      ASAP.  If they cannot do it today would ask vascular radiology to place.      Save left arm for permanent access which CVTS will need to help with.      She will need hemodialysis as soon as access is placed.  Would treat her      present hyperkalemia as you are doing with Kayexalate, glucose, and      insulin and also give her an amp of sodium bicarbonate as she is      acidotic.  Would hold her packed cells until we get dialysis started due      to potassium and volume issues.  2.  Anemia with iron deficiency.  She needs to have intravenous iron      replacement in the form of NSAID and begin Aranesp.  She should probably      also be transfused with dialysis at least 2 units of packed cells.  3.  Secondary hyperparathyroidism with prior elevation of PTH over 600.      Will start on phosphate binders and vitamin D.  4.  Uncontrolled hypertension.  She is not controlled with intravenous      nitroglycerin and is unlikely to keep down oral labetalol.  Will give      intravenous labetalol doses and apply Catapres patch.  You could use an     ACE inhibitor or ARB once she is on dialysis.  5.  Seropositive deforming rheumatoid  arthritis.  Would ask Dr. Rockwell Alexandria for  his input as she has not seen him in some time.   Thanks for asking [place] to see this unfortunate patient.  The good news is that  she does consent now to aggressive treatment for what is most likely end-  stage renal disease.  We will follow closely.           ______________________________  Elzie Rings Lorrene Reid, M.D.     CBD/MEDQ  D:  08/19/2005  T:  08/19/2005  Job:  [id number]

## 2011-04-08 NOTE — H&P (Signed)
NAME:  Kathleen [name], Kathleen [name] NO.:  [id number]   MEDICAL RECORD NO.:  [id number]          PATIENT TYPE:  EMS   LOCATION:  MAJO                         FACILITY:  [facility]   PHYSICIAN:  Billey Chang, M.D.     DATE OF BIRTH:  January 14, 1966   DATE OF ADMISSION:  08/18/2005  DATE OF DISCHARGE:                                HISTORY & PHYSICAL   PRIMARY CARE PHYSICIAN:  Madeleine B. Smith Mince, M.D., [facility].   CHIEF COMPLAINT:  Shortness of  breath/hypertension.   HISTORY OF PRESENT ILLNESS:  Ms. Beda is a 45 year old African/American  female with a history of rheumatoid arthritis diagnosed in 2000,  hypertension with renal failure and a baseline creatinine of 1.5 in January  2004, who presents with dyspnea, chest pain and left back pain, with no  radiation, that lasts for two to three minutes at rest and worsens with  exacerbation.  This has worsened over the last week.  The patient states  that she feels lightheaded on climbing stairs.  Denies any syncope.  She  does admit to PND with two-pillow orthopnea.  Denies any lower extremity  edema.  She is seen today in the clinic by Dr. Marcello Moores Day and given Hyzaar.  She states that she was told that she has kidney problems with decreased  function in February 2006.  The patient declined a biopsy or dialysis at  that time and wanted to attempt herbal/homeopathic medicine, and began to  use black strap molasses for anemia, as well as a reversal pill for her  kidney function and a multivitamin.   PAST MEDICAL HISTORY:  1.  Significant for anemia.  2.  Rheumatoid arthritis.  3.  Menorrhea.  4.  Renal failure.   PAST SURGICAL HISTORY:  Cesarean section.   PREVIOUS HOSPITALIZATIONS:  None.   MEDICATIONS:  1.  As noted above, black strap molasses.  2.  Reversal pill.  3.  A multivitamin.   DISCONTINUED MEDICATIONS:  1.  The patient states that she has been off of Plaquenil 200 mg b.i.d. on      Monday  through Saturday and daily on Sunday since February 2006.  2.  Also off of Aleve 1 mg p.o. daily p.r.n.  3.  She is also off of her ferrous sulfate, one tab p.o. t.i.d.  4.  Also off of her Ortho vera patch.   ALLERGIES:  No known drug allergies.   FAMILY HISTORY:  Significant for her grandmother with diabetes and also  rheumatoid arthritis.  Mother is healthy.  Dad was a smoker and healthy.  She has two sisters with anemia.   SOCIAL HISTORY:  The patient lives in [place] with her 46 year old son,  as well as a 90-year-old son.  She works at a [employer] and attends [employer] for [employer].  She was formerly in [place] [employer] and moved from [place].  She is from the [place].  She  denies any tobacco use presently or in the past.  She denies any marijuana,  cocaine or illegal drug use.  REVIEW OF SYSTEMS:  Positive for subjective fevers and chills.  She also  notes that she is making urine and admits to increased frequency but no  dysuria.  She denies any melena or hematochezia.   PHYSICAL EXAMINATION:  VITAL SIGNS:  Temperature 97.4 degrees, blood  pressure 181/126, heart rate 114, respirations 20, pulse oximetry 99% on  room air.  Transition to 2 liters nasal cannula, saturating greater than  98%.  GENERAL:  The patient is pale-appearing.  Mental status:  She is alert,  awake and oriented x4.  HEENT:  Pupils equal, round, reactive to light and accommodation.  Extraocular muscles intact.  Funduscopic exam reveals no engorgement of the  retinal vessels.  HEART:  A 3/6 holosystolic murmur heard best at the left upper sternal  border.  ABDOMEN:  Soft, nontender, with no CVA tenderness.  No bruits heard.  EXTREMITIES:  No cyanosis or edema.   LABORATORY DATA:  White blood cell count 8.7, hemoglobin 7.4 and 7.9 in  2004, hematocrit 22.3, platelets 416.  Sodium 134, potassium 5.1, chloride  108, CO2 of 12, BUN 70, creatinine 11.9, baseline  of 1.5 in January 2004,  glucose 125.   Chest x-ray shows mild congestive heart failure.   Electrocardiogram:  Sinus tachycardia at 110 with left atrial enlargement  and left ventricular hypertrophy.   Beta natriuretic peptide greater than 32,000.  D-dimer 5.65.   ASSESSMENT/PLAN:  This is a 45 year old African/American female with a  history of rheumatoid arthritis, hypertensive emergency and renal failure:  The patient will be admitted to a telemetry bed in the step-down unit.   For her dyspnea, this is likely related to high cardiac output failure with  a beta natriuretic peptide of greater than 32,000.  We will rule out  myocardial infarction and obtain cardiac enzymes x3 every eight hours.  Repeat an electrocardiogram in the a.m.  Will obtain a TSH to rule out  hyperthyroidism and a fasting lipid profile  for further risk  stratification, as well as a 2-D echocardiogram and TEE to rule out any  valvular abnormalities.   For the hypertensive emergency with end organ damage, this renal failure,  although the renal failure is likely not to be acute.  We will place the  patient on a nitroglycerin drip at 10 mcg per minute and increase by 5 mcg  per minute every five minutes until systolic blood pressure is 150-160.  Hold for a systolic blood pressure if her systolic blood pressure drops to  less than 120.  We will add Labetalol 10 mg IV p.r.n.   At this time there are no neurologic symptoms.  If there are any acute  mental status changes, we will obtain a stat CT of the head without  contrast, to rule out a stroke.   For the renal failure, the patient will be placed on strict I&O's and place  a Foley catheter is needed.  We will send urine, sodium and creatinine, to  determine if it is pre-renal, intra-renal or post-renal.  Will also obtain a  24-hour urine collection for proteins, to estimate the patient's GFR.  Will obtain a renal ultrasound in the a.m.  Will also obtain a  renal consultation  in the a.m. for possible dialysis.  We will send a urinalysis for casts, to  rule out nephrotic versus nephritic syndrome, as well as a repeat CMET.   For anemia, this is likely related to her renal disease.  An iron profile in  February  2004, revealed an iron of 49, total iron binding capacity of 273,  ferritin 18, which is consistent with iron deficiency anemia.  We will  Hemoccult stools and type and screen the patient.  On previous admission her  hemoglobin was 7.4.  We will also consider at some point Aranesp to increase  the total iron stores, and then Epogen in the future.   For the rheumatoid arthritis, the patient has been off her medications since  February 2004.  Of note, the patient had self-discontinued these  medications.  She was previously seen by a rheumatologist, Dr. Ander Slade.  Levitin.  We will hold her previous medications, including Plaquenil as well  as ibuprofen.   Fluids/Electrolytes/Nutrition:  KVO.  Electrolytes are stable.  We will add  on magnesium and phosphorus with her a.m. labs and place on a __________,  with 1500 mg intake per day.   DISPOSITION:  We will continue to follow based on the patient's renal  failure, resolution of hypertensive emergency, as well as dyspnea.      Richardine Service, M.D.    ______________________________  Billey Chang, M.D.    MR/MEDQ  D:  08/19/2005  T:  08/19/2005  Job:  [id number]

## 2011-04-08 NOTE — Discharge Summary (Signed)
Kathleen [name], Kathleen [name] NO.:  [id number]   MEDICAL RECORD NO.:  [id number]          PATIENT TYPE:  INP   LOCATION:  [id number]                         FACILITY:  [facility]   PHYSICIAN:  Talbert Cage, M.D.DATE OF BIRTH:  1966-10-04   DATE OF ADMISSION:  08/18/2005  DATE OF DISCHARGE:  08/25/2005                                 DISCHARGE SUMMARY   DISCHARGE DIAGNOSES:  1.  Acute renal failure, end-stage renal disease.  2.  Rheumatoid arthritis.  3.  Anemia.  4.  Allergic rhinitis.  5.  Amenorrhea.   DISCHARGE MEDICATIONS:  1.  PhosLo 667 grams 2 tablets with each meal.  2.  Nephro-Vite one tablet daily.  3.  Enalapril 10 mg every h.s.  4.  Norvasc 5 mg daily.  5.  Colace 100 mg twice daily p.r.n. constipation.  6.  Lactulose 30 mL daily p.r.n. constipation.  7.  Prilosec OTC 20 mg daily.   CONSULTATIONS:  Renal.   HISTORY AND PHYSICAL:  For details, please see dictated H&P.  Briefly, Kathleen [name] is a 45 year old African-American female with a history of rheumatoid  arthritis, hypertension with renal failure and a baseline creatinine of 1.5,  who presented with dyspnea, chest pain and left back pain with no radiation.  The patient admitted that these symptoms worsened over the week before  hospitalization.  She also admitted to feeling lightheaded when climbing  stairs but denied any syncope.  Also admitted to PND and a two-pillow  orthopnea.  Denied any lower extremity edema.  Was seen in clinic by Dr.  Marcello Moores Day and given Hyzaar on August 18, 2005.  Supposedly she was told  she had kidney problems with decreased function back in February 2006 but  declined biopsy or followup at that time and instead wanted to attempt  herbal and homeopathic medicine.   HOSPITAL COURSE:  Issues addressed during hospital course:   Problem 1. End-stage renal disease.  The patient presented to the hospital  with chest pain, increasing shortness of breath, back pain which did  not  radiate.  On initial labs, the patient was white blood cell count was 8.7  with an H&H of 7.4 and 22.3.  The patient's BNP with a sodium of 134, a  potassium of 5.1, a chloride of 108, a CO2 of 12, a BUN of 70, and a  creatinine of 11.9 with a baseline creatinine of 1.5 back in January of  2004.  On August 19, 2005, the patient's potassium was noted to increase  up to 6.5.  Renal was consulted, came and evaluated the patient and  determined the patient probably in end-stage renal disease secondary to  chronic glomerulonephritis.  Did not think a biopsy was required.  A renal  ultrasound was ordered which showed enlarged bilateral echogenic kidneys.  HIV test was drawn secondary to renal stating that sometimes echogenic  kidneys signify HIV disease.  HIV came back negative.  Renal suggested  putting in a PermaCath.  The patient would need immediate hemodialysis.  CVTS notified and contacted.  CVTS stated they would put in the [place],  but the patient's blood pressure at the time which was in the 200s/120s was  too high for surgery.  The patient was transferred to the ICU and started on  a labetalol drip, which brought the patient's blood pressure down into an  optimal range for surgery.  The patient's potassium was also too high  initially for surgery.  The patient was given one amp of glucose followed by  5 units of insulin.  The patient also given Kayexalate. Within a few hours,  the patient's potassium had dropped to 5.5, which was in optimal range for  surgery.  CVTS proceeded with surgery on August 20, 2005.  A PermaCath  was placed.  The patient did well during surgery.  Hemodialysis was started  the following day on August 21, 2005.  During hemodialysis, the patient's  blood pressure did drop as well as her heart rate dropped into the 30s.  The  patient's blood pressure and heart rate did increase after dialysis  finished.  The patient's labetalol p.o. was then  discontinued, and the  patient was just placed on ARB, Cozaar 25 mg daily, and kept on her Norvasc  5 mg daily.  The patient continued with hemodialysis on August 22, 2005 and  also received hemodialysis on August 25, 2005.  The patient's blood  pressures remained within normal ranges during these hemodialysis sessions,  as well as her heart rate remained within normal limits.  The patient stated  she was feeling much better after dialysis treatments.  The patient also  went to surgery on August 23, 2005 to get an AV fistula placed.  Status post  AV fistula placement in her left arm, ultrasound noted the fistula had  occluded the same day as operation.  So the patient went back on August 24, 2005 to have another AV fistula placed.  CVTS will be stopping by today,  August 25, 2005, with ultrasound to evaluate for any occlusion.  If not  occluded, the patient will be discharged later this afternoon on August 25, 2005.  The patient's outpatient hemodialysis is already set up at the  [facility] on Tuesday, Thursday and Saturation.  Patient given  instructions to take no showers and to keep catheter dry.  Patient also  advised to stop taking any herbal medications as well as any NSAIDs.   Problem 2. Hyperkalemia.  On August 19, 2005, the patient's potassium  reached 6.5.  The patient was given Kayexalate as well as an amp of glucose  followed by 5 units of insulin.  Within four hours, the patient's potassium  was brought back down to 5.5, within range for the patient to receive her  PermaCath.  Status post dialysis, the patient's potassium came back within  normal range and has remained within normal range throughout the rest of her  hospitalization.   Problem 3. Blood pressure.  Initially when the patient presented to the ED,  she was in a hypertensive emergency.  Her blood pressures were in the 180s/120s with obvious end organ damage.  The patient was started on   nitroglycerin drip to bring blood pressures down.  On August 19, 2005,  the patient's blood pressure reached into the 200s/120s.  She was  transferred to the ICU and started on a labetalol drip to bring blood  pressures down slowly into a range where surgeons could proceed with her  PermaCath placement.  The patient's blood pressures remained in the 140s/90s  status post PermaCath placement  on August 20, 2005.  During her first  hemodialysis session on August 21, 2005, the patient's blood pressure did  drop as well as her heart rate into the 30s.  The patient's p.o. labetalol  was then discontinued and the patient put on an ARB and kept on her Norvasc  per renal consult.  The next day on August 22, 2005, the patient switched  from Cozaar to enalapril 10 mg for financial reasons.  After the patient was  put on her enalapril and labetalol discontinued, the patient's blood  pressures remained within normal ranges, ranging from the systolic [id number] to  [id number] over diastolic of Q000111Q to [id number].   Problem 4. Anemia.  During the hospitalization the patient did receive two  units of packed red blood cells for her anemia.  The patient also had her  stools checked by Hemoccult, which turned out to be negative.  The patient  was started on Aranesp as well as iron during her hemodialysis sessions.  Throughout the rest of hospitalization, the patient's H&H remained stable.  The patient's last CBC on August 25, 2005 revealed a hemoglobin of 9.6 and a  hematocrit of 28.6.   Problem 5. Hypocalcemia and hyperphosphatemia.  At last check on August 25, 2005, the patient's calcium was 6.7 and the patient's phosphorus 8.6.  The  patient started on Nephro-Vite one tablet daily during hospitalization and  also started on PhosLo 667 grams 2 tablets with each meal in order to  decrease phosphorus and increase calcium.  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  The patient's   electrolytes followed by renal throughout hospitalization.  The patient's  electrolytes will be followed through her outpatient hemodialysis.   Problem 6. Constipation.  The patient states last bowel movement was on  August 20, 2005.  Has not had a bowel movement since then.  During  hospitalization the patient was started on Colace 100 mg b.i.d.  The patient  also started on lactulose 30 cc every day on August 24, 2005.  The patient  will be sent home with prescriptions for both Colace and lactulose.  Instructed to take p.r.n. constipation.   DISPOSITION:  Discharge anticipated to be August 25, 2005 status post CVTS  consult.  Surgery to come by and evaluate patency of new AV fistula which  was placed on August 24, 2005.  Once surgery clears the patency of the  fistula, discharge order will be written for the patient.  The patient has  already received hemodialysis education.  Renal nutritionist has come by to see the patient and has instructed the patient on a renal diet.  The patient  will be also seeing social work this afternoon on August 25, 2005 to talk  about a service for rides to hemodialysis.  She will also be talking to a  Education officer, museum about Fish farm manager.  After all these consults are complete,  the patient will be discharged today on August 25, 2005.   DISCHARGE CONDITION:  Fair.   INSTRUCTIONS:  To stop herbal medications.  To stop any NSAID use.  To  return to see Dr. Oneida Alar on September 23, 2005 at 12:30 p.m.  The patient also  instructed that she will have outpatient hemodialysis at [facility] on Tuesdays, Thursdays and Saturdays.  The patient instructed to have  no showers, to keep catheter dry in the meantime.   DICTATED BY:  Chrissie Tammy Sours, M.D.  ______________________________  Talbert Cage, M.D.    VRE/MEDQ  D:  08/25/2005  T:  08/25/2005  Job:  [id number]

## 2011-04-08 NOTE — Op Note (Signed)
NAME:  Kathleen [name], Kathleen [name] NO.:  [id number]   MEDICAL RECORD NO.:  [id number]          PATIENT TYPE:  AMB   LOCATION:  DAY                          FACILITY:  [facility]   PHYSICIAN:  Georgina Quint, M.D.   DATE OF BIRTH:  June 01, 1966   DATE OF PROCEDURE:  03/29/2006  DATE OF DISCHARGE:                                 OPERATIVE REPORT   PREOPERATIVE DIAGNOSIS:  Epigastric hernia.   POSTOPERATIVE DIAGNOSIS:  Recurrent umbilical hernia.   OPERATION:  Repair of recurrent umbilical hernia.   SURGEON:  Bowman.   ANESTHESIA:  General and local.   COMPLICATIONS:  None.   BLOOD LOSS:  Minimal.   Condition to PACU good.   PROCEDURE:  After the patient was monitored and given general anesthesia and  had routine preparation and draping of the mid abdomen.  I made a vertical  incision beginning right in the umbilicus and going upward for about 4 cm.  I dissected down to a sac-like structure and dissected it away from the  normal surrounding scar and subcutaneous tissues, dissected down to the  fascia and found it to be a hernia sac containing some fluid and omentum.  I  could not reduce it after freeing it up at the edges of the hernia sac.  I  thought I might be able to reduce the contents by removal of the sac and so  I debrided the sac carefully with cautery and found there was nothing in it  except omentum.  However, I still could not reduce the omentum which was  there and so I removed a portion of that, suture ligating it with 3-0 Vicryl  and was able to reduce the remainder of the contents.  Hemostasis was good.  I then closed the hernia defect which was approximately a centimeter and a  half in size with running 2-0 Prolene suture.  The defect occurred right at  the upper edge of large wad of polypropylene mesh and Prolene suture.  I  removed a goodly portion of that separating it from the umbilical skin and  removing it with a knife and with cautery.  I then  dissected downward from  about 3 cm additionally to be sure I was all the way past the hernia.  I  dissected upward and laterally for similar distance in order to patch the  repair with new polypropylene mesh.  I cut a piece of polypropylene mesh  approximately 4.5 cm in diameter and sewed that in overlaying the repair  with running 2-0 Prolene basting suture.  I sewed it down and middle to the  old mesh with simple 2-0 Prolene stitch.  I then thoroughly anesthetized the  operative area with long-acting local anesthetic.  I used several sutures of  3-0 Vicryl to  close dead space and prevent fluid accumulation, sewing in the subcutaneous  tissues and sewing it down to the mesh.  I closed the skin with running  intracuticular 4-0 Vicryl and Steri-Strips.  The patient tolerated the  operation well.      Georgina Quint, M.D.  Electronically Signed  WB/MEDQ  D:  03/29/2006  T:  03/30/2006  Job:  [id number]   cc:   Jeneen Rinks L. Deterding, M.D.  Fax: [phone]   Briscoe Deutscher. Smith Mince, M.D.  Fax: [phone]

## 2011-04-08 NOTE — Op Note (Signed)
Kathleen [name], FARD NO.:  [id number]   MEDICAL RECORD NO.:  [id number]          PATIENT TYPE:  INP   LOCATION:  5524                         FACILITY:  [facility]   PHYSICIAN:  Jessy Oto. Fields, MD  DATE OF BIRTH:  May 13, 1966   DATE OF PROCEDURE:  08/24/2005  DATE OF DISCHARGE:                                 OPERATIVE REPORT   PROCEDURE:  Left brachiocephalic arteriovenous fistula.   PREOPERATIVE DIAGNOSIS:  End-stage renal disease.   POSTOPERATIVE DIAGNOSIS:  End-stage renal disease.   ANESTHESIA:  Local with IV sedation.   SURGEON:  Jessy Oto. Fields, M.D.   ASSISTANT:  Suzzanne Cloud, P.A.   OPERATIVE FINDINGS:  1.  A 4-mm cephalic vein.  2.  A 3-mm brachial artery.   OPERATIVE DETAILS:  After obtaining informed consent, the patient was taken  to the operating room.  The patient was placed in a supine position on the  operating table.  After adequate sedation, the patient's entire left upper  extremity was prepped and draped in the usual sterile fashion.  Local  anesthesia was infiltrated near the antecubital crease.  A transverse  incision was made in this location and carried down through the subcutaneous  tissues down to the level of the cephalic vein.  The cephalic vein was  dissected free circumferentially for approximately 5 cm and small side  branches were ligated and divided between silk ties.  Next, the brachial  artery was dissected free in the medial portion of the incision.  The  brachial artery was controlled proximally and distally with Vesseloops.  The  patient was then given 5000 units of intravenous heparin.  The distal  cephalic vein was ligated with silk ties.  The vein was then swung over to  the level of the artery.  A longitudinal arteriotomy was made and the vein  sewn end-of-vein-to-side-of-artery using a running 7-0 Prolene suture.  Just  prior to completion of the anastomosis, this was fore-bled, back-bled and  thoroughly  flushed.  The anastomosis was secured and clamps were released;  there was a palpable thrill in the fistula immediately.  Hemostasis was  obtained.  Next, the subcutaneous tissues were reapproximated using a  running 3-0 Vicryl suture.  Skin was closed with a 4-0 Vicryl subcuticular  stitch.  The patient tolerated the procedure well and there were no  immediate complications.  Instrument, sponge and needle count was correct at  the end of the case.  The patient was taken to the recovery room in stable  condition.           ______________________________  Jessy Oto Fields, MD     CEF/MEDQ  D:  08/24/2005  T:  08/25/2005  Job:  [id number]

## 2011-04-08 NOTE — Op Note (Signed)
Kathleen [name], ONNEN NO.:  [id number]   MEDICAL RECORD NO.:  [id number]          PATIENT TYPE:  INP   LOCATION:  2101                         FACILITY:  [facility]   PHYSICIAN:  Dorothea Glassman, M.D.    DATE OF BIRTH:  [birth year]   DATE OF PROCEDURE:  08/20/2005  DATE OF DISCHARGE:                                 OPERATIVE REPORT   SURGEON:  Gordy Clement, M.D.   ASSISTANT:  Nurse.   ANESTHETIC:  Local MAC.   ANESTHESIOLOGIST:  Dr. Ola Spurr.   PREOPERATIVE DIAGNOSIS:  End-stage renal failure.   POSTOPERATIVE DIAGNOSIS:  End-stage renal failure.   PROCEDURE:  Ultrasound-guided right internal jugular Diatek catheter.   OPERATIVE PROCEDURE:  The patient was brought to the operating room in a  stable condition. Placed in the supine position. Ultrasound of the right  neck was carried out. The right internal jugular vein revealed normal  compressibility. Normal respiratory variation.   The skin of the right neck was prepped and draped in a sterile fashion. The  skin and subcutaneous tissues were instilled with 1% Xylocaine. The needle  easily introduced in the right internal jugular vein. A 0.035 J-wire was  passed through the needle into the superior vena cava. The site was opened  with an 11 blade. 12, 14 and 16 dilators advanced over the guidewire. The 16  dilator and tearaway sheath advanced over the guidewire, the dilator and  guidewire removed. The catheter was placed through the tearaway sheath, and  the tearaway sheath removed. Catheter positioned at the superior vena cava,  right atrial junction. Subcutaneous tunnel created. The catheter brought  through the tunnel, the catheter divided and the hub mechanism assembled.  The catheter flushed with heparin saline solution and capped with heparin  solution. The insertion site was closed with interrupted 3-0 nylon suture.  The catheter fixed to the skin with interrupted 2-0 silk suture. Sterile  dressings  applied.   No apparent complications. The patient transferred to the recovery room in  stable condition.      Dorothea Glassman, M.D.  Electronically Signed     PGH/MEDQ  D:  08/20/2005  T:  08/20/2005  Job:  [id number]

## 2011-04-08 NOTE — Op Note (Signed)
   NAME:  Kathleen [name], Kathleen [name] NO.:  [id number]   MEDICAL RECORD NO.:  [id number]                   PATIENT TYPE:  OIB   LOCATION:  NA                                   FACILITY:  [facility]   PHYSICIAN:  Kathrin Penner, M.D.                DATE OF BIRTH:  1966-03-17   DATE OF PROCEDURE:  11/28/2002  DATE OF DISCHARGE:                                 OPERATIVE REPORT   PREOPERATIVE DIAGNOSIS:  Recurrent umbilical hernia.   POSTOPERATIVE DIAGNOSIS:  Recurrent umbilical hernia.   PROCEDURE:  Repair of recurrent umbilical hernia with mesh.   SURGEON:  Kathrin Penner, M.D.   ASSISTANT:  Nurse.   ANESTHESIA:  General.   This patient is a 45 year old woman, status post primary repair of her  umbilical hernia, who now returns with umbilical pain and mass and  examination showing a recurrent umbilical hernia.  She comes to the  operating room after the risks and potential of surgery have been fully  discussed and all questions answered and consent obtained.   PROCEDURE:  Following the induction of satisfactory general anesthesia, the  patient was positioned supinely.  The abdomen prepped and draped routinely.  The old scar cicatrix under the umbilicus was excised and removed.  Dissection carried down to the sac.  The sac was mobilized on all sides,  carrying the dissection down to the fascia.  The sac was amputated and  removed and discarded.  The fascial edges were cleaned, and the defect was  repaired by a plug of polypropylene mesh sutured in on the edges with #0  Novofil sutures.  At the end of the repair, the repair was entirely intact.  Sponge, instruments, and sharp counts were verified.  The subcutaneous  tissues were closed over the mesh with interrupted 2-0 Vicryl sutures.  The  skin was closed with a running 4-0 Monocryl suture and then reinforced with  Steri-Strips.  A sterile dressing was applied.  Anesthetic was reversed.  The patient was moved from  the operating room to the recovery room in stable  condition.  She tolerated the procedure well.                                                Kathrin Penner, M.D.    PB/MEDQ  D:  11/28/2002  T:  11/28/2002  Job:  [id number]

## 2011-04-21 ENCOUNTER — Encounter (HOSPITAL_COMMUNITY): Payer: Medicare Other

## 2011-04-21 ENCOUNTER — Other Ambulatory Visit: Payer: Self-pay | Admitting: Nephrology

## 2011-05-05 ENCOUNTER — Other Ambulatory Visit: Payer: Self-pay | Admitting: Nephrology

## 2011-05-05 ENCOUNTER — Encounter (HOSPITAL_COMMUNITY): Payer: Medicare Other | Attending: Nephrology

## 2011-05-05 DIAGNOSIS — D638 Anemia in other chronic diseases classified elsewhere: Secondary | ICD-10-CM | POA: Insufficient documentation

## 2011-05-05 DIAGNOSIS — N184 Chronic kidney disease, stage 4 (severe): Secondary | ICD-10-CM | POA: Insufficient documentation

## 2011-05-05 LAB — POCT HEMOGLOBIN-HEMACUE: Hemoglobin: 10.1 g/dL — ABNORMAL LOW (ref 12.0–15.0)

## 2011-05-17 ENCOUNTER — Inpatient Hospital Stay (INDEPENDENT_AMBULATORY_CARE_PROVIDER_SITE_OTHER)
Admission: RE | Admit: 2011-05-17 | Discharge: 2011-05-17 | Disposition: A | Discharge status: Home / Self Care | Payer: Medicare Other | Source: Ambulatory Visit | Attending: Emergency Medicine | Admitting: Emergency Medicine

## 2011-05-17 DIAGNOSIS — T783XXA Angioneurotic edema, initial encounter: Secondary | ICD-10-CM

## 2011-05-19 ENCOUNTER — Other Ambulatory Visit: Payer: Self-pay | Admitting: Nephrology

## 2011-05-19 ENCOUNTER — Encounter (HOSPITAL_COMMUNITY): Payer: Medicare Other

## 2011-05-20 LAB — POCT HEMOGLOBIN-HEMACUE: Hemoglobin: 9.3 g/dL — ABNORMAL LOW (ref 12.0–15.0)

## 2011-06-02 ENCOUNTER — Encounter (HOSPITAL_COMMUNITY): Payer: Medicare Other | Attending: Nephrology

## 2011-06-02 ENCOUNTER — Other Ambulatory Visit: Payer: Self-pay | Admitting: Nephrology

## 2011-06-02 DIAGNOSIS — D638 Anemia in other chronic diseases classified elsewhere: Secondary | ICD-10-CM | POA: Insufficient documentation

## 2011-06-02 DIAGNOSIS — N184 Chronic kidney disease, stage 4 (severe): Secondary | ICD-10-CM | POA: Insufficient documentation

## 2011-06-03 LAB — POCT HEMOGLOBIN-HEMACUE: Hemoglobin: 9.2 g/dL — ABNORMAL LOW (ref 12.0–15.0)

## 2011-06-08 ENCOUNTER — Encounter (HOSPITAL_COMMUNITY): Payer: Medicare Other

## 2011-06-16 ENCOUNTER — Other Ambulatory Visit: Payer: Self-pay | Admitting: Nephrology

## 2011-06-16 ENCOUNTER — Inpatient Hospital Stay (INDEPENDENT_AMBULATORY_CARE_PROVIDER_SITE_OTHER)
Admission: RE | Admit: 2011-06-16 | Discharge: 2011-06-16 | Disposition: A | Discharge status: Home / Self Care | Payer: Medicare Other | Source: Ambulatory Visit | Attending: Family Medicine | Admitting: Family Medicine

## 2011-06-16 ENCOUNTER — Encounter (HOSPITAL_COMMUNITY): Payer: Medicare Other

## 2011-06-16 ENCOUNTER — Ambulatory Visit (INDEPENDENT_AMBULATORY_CARE_PROVIDER_SITE_OTHER): Payer: Medicare Other

## 2011-06-16 DIAGNOSIS — M546 Pain in thoracic spine: Secondary | ICD-10-CM

## 2011-06-16 DIAGNOSIS — M799 Soft tissue disorder, unspecified: Secondary | ICD-10-CM

## 2011-06-19 ENCOUNTER — Inpatient Hospital Stay (INDEPENDENT_AMBULATORY_CARE_PROVIDER_SITE_OTHER)
Admission: RE | Admit: 2011-06-19 | Discharge: 2011-06-19 | Disposition: A | Discharge status: Home / Self Care | Payer: Medicare Other | Source: Ambulatory Visit | Attending: Family Medicine | Admitting: Family Medicine

## 2011-06-19 DIAGNOSIS — B029 Zoster without complications: Secondary | ICD-10-CM

## 2011-06-30 ENCOUNTER — Other Ambulatory Visit: Payer: Self-pay | Admitting: Nephrology

## 2011-06-30 ENCOUNTER — Encounter (HOSPITAL_COMMUNITY): Payer: Medicare Other | Attending: Nephrology

## 2011-06-30 DIAGNOSIS — N184 Chronic kidney disease, stage 4 (severe): Secondary | ICD-10-CM | POA: Insufficient documentation

## 2011-06-30 DIAGNOSIS — D638 Anemia in other chronic diseases classified elsewhere: Secondary | ICD-10-CM | POA: Insufficient documentation

## 2011-07-14 ENCOUNTER — Other Ambulatory Visit: Payer: Self-pay | Admitting: Nephrology

## 2011-07-14 ENCOUNTER — Encounter (HOSPITAL_COMMUNITY): Payer: Medicare Other

## 2011-07-21 ENCOUNTER — Ambulatory Visit (INDEPENDENT_AMBULATORY_CARE_PROVIDER_SITE_OTHER): Payer: Medicare Other | Admitting: Family Medicine

## 2011-07-21 ENCOUNTER — Encounter: Payer: Self-pay | Admitting: Family Medicine

## 2011-07-21 ENCOUNTER — Encounter (HOSPITAL_COMMUNITY): Payer: Medicare Other

## 2011-07-21 DIAGNOSIS — B029 Zoster without complications: Secondary | ICD-10-CM

## 2011-07-21 DIAGNOSIS — D649 Anemia, unspecified: Secondary | ICD-10-CM

## 2011-07-21 DIAGNOSIS — Z940 Kidney transplant status: Secondary | ICD-10-CM | POA: Insufficient documentation

## 2011-07-21 DIAGNOSIS — M069 Rheumatoid arthritis, unspecified: Secondary | ICD-10-CM | POA: Insufficient documentation

## 2011-07-21 MED ORDER — TRAMADOL HCL 50 MG PO TABS: 50.0000 mg | ORAL_TABLET | Freq: Three times a day (TID) | ORAL | Status: AC | PRN

## 2011-07-21 NOTE — Assessment & Plan Note (Signed)
Given tramadol to try will come back if necessary.

## 2011-07-21 NOTE — Progress Notes (Signed)
  Subjective:    Patient ID: Kathleen [name], female    DOB: 1966-05-25, 45 y.o.   MRN: [id number]  HPI Pt is here to establish with our practice again.   Pt forgot to bring her medication list and will fax it to me.   Pt recently had shingles and was unable to get a kidney transplant like she was supposed to.  Pt has been taking vicodin for pain but would like something else so she does not become addicted.  Pt states the pain is better and was given a triamcinolone cream which has helped as well, still deep pain.   Pt has hx of hypertension but states it is due to keeping the kidney safe, on medicine no side effects will give [place] list of meds   Hx of renal transplant  Seeing dr. Lorrene Reid.   Review of Systems Denies fever, chills, nausea vomiting abdominal pain, dysuria, chest pain, shortness of breath dyspnea on exertion or numbness in extremities Past medical history, social, surgical and family history all reviewed.      Objective:   Physical Exam General: Well-developed,well-nourished,in no acute distress; alert,appropriate and cooperative throughout examination  Lungs: Normal respiratory effort, chest expands symmetrically. Lungs are clear to auscultation, no crackles or wheezes.  Heart: Normal rate and regular rhythm. S1 and S2 normal without gallop, murmur, click, rub or other extra sounds.  Abdomen:  large surgical scar over RLQ, well healed, soft and non-tender. soft and non-tender.  Ext: no edema, neurovascularly intact   Skin: ulnar deviation of hand bilateral. Severe arthritic changes     Assessment & Plan:

## 2011-07-21 NOTE — Assessment & Plan Note (Signed)
Continue to see Dr. Lorrene Reid.  Will follow in the distance.

## 2011-07-21 NOTE — Assessment & Plan Note (Signed)
Pt gets ferriheme monthly. Doing well.

## 2011-07-21 NOTE — Patient Instructions (Signed)
Sent in tramadol for your pain to try Please have dr. Lorrene Reid send her labs on over.  I will see you again when you need me.

## 2011-07-28 ENCOUNTER — Other Ambulatory Visit: Payer: Self-pay | Admitting: Nephrology

## 2011-07-28 ENCOUNTER — Encounter (HOSPITAL_COMMUNITY): Payer: Medicare Other | Attending: Nephrology

## 2011-07-28 DIAGNOSIS — D638 Anemia in other chronic diseases classified elsewhere: Secondary | ICD-10-CM | POA: Insufficient documentation

## 2011-07-28 DIAGNOSIS — N184 Chronic kidney disease, stage 4 (severe): Secondary | ICD-10-CM | POA: Insufficient documentation

## 2011-08-11 ENCOUNTER — Encounter (HOSPITAL_COMMUNITY): Payer: Medicare Other

## 2011-08-11 ENCOUNTER — Other Ambulatory Visit: Payer: Self-pay | Admitting: Nephrology

## 2011-08-11 LAB — POCT HEMOGLOBIN-HEMACUE: Hemoglobin: 10 g/dL — ABNORMAL LOW (ref 12.0–15.0)

## 2011-08-25 ENCOUNTER — Other Ambulatory Visit: Payer: Self-pay | Admitting: Nephrology

## 2011-08-25 ENCOUNTER — Encounter (HOSPITAL_COMMUNITY)
Admission: RE | Admit: 2011-08-25 | Discharge: 2011-08-25 | Disposition: A | Discharge status: Home / Self Care | Payer: Medicare Other | Source: Ambulatory Visit | Attending: Nephrology | Admitting: Nephrology

## 2011-08-25 DIAGNOSIS — D638 Anemia in other chronic diseases classified elsewhere: Secondary | ICD-10-CM | POA: Insufficient documentation

## 2011-08-25 DIAGNOSIS — N184 Chronic kidney disease, stage 4 (severe): Secondary | ICD-10-CM | POA: Insufficient documentation

## 2011-08-25 LAB — POCT HEMOGLOBIN-HEMACUE: Hemoglobin: 9.8 g/dL — ABNORMAL LOW (ref 12.0–15.0)

## 2011-09-08 ENCOUNTER — Encounter (HOSPITAL_COMMUNITY): Payer: Medicare Other

## 2011-09-22 ENCOUNTER — Encounter (HOSPITAL_COMMUNITY): Payer: Medicare Other

## 2011-11-22 DIAGNOSIS — D638 Anemia in other chronic diseases classified elsewhere: Secondary | ICD-10-CM | POA: Diagnosis not present

## 2011-11-22 DIAGNOSIS — R7989 Other specified abnormal findings of blood chemistry: Secondary | ICD-10-CM | POA: Diagnosis not present

## 2011-11-22 DIAGNOSIS — Z940 Kidney transplant status: Secondary | ICD-10-CM | POA: Diagnosis not present

## 2011-11-22 DIAGNOSIS — Z79899 Other long term (current) drug therapy: Secondary | ICD-10-CM | POA: Diagnosis not present

## 2011-11-22 DIAGNOSIS — I10 Essential (primary) hypertension: Secondary | ICD-10-CM | POA: Diagnosis not present

## 2011-11-23 DIAGNOSIS — Z48298 Encounter for aftercare following other organ transplant: Secondary | ICD-10-CM | POA: Diagnosis not present

## 2011-11-23 DIAGNOSIS — Z79899 Other long term (current) drug therapy: Secondary | ICD-10-CM | POA: Diagnosis not present

## 2011-11-23 DIAGNOSIS — Z940 Kidney transplant status: Secondary | ICD-10-CM | POA: Diagnosis not present

## 2011-11-23 DIAGNOSIS — I10 Essential (primary) hypertension: Secondary | ICD-10-CM | POA: Diagnosis not present

## 2011-11-25 DIAGNOSIS — B961 Klebsiella pneumoniae [K. pneumoniae] as the cause of diseases classified elsewhere: Secondary | ICD-10-CM | POA: Diagnosis not present

## 2011-11-25 DIAGNOSIS — Z79899 Other long term (current) drug therapy: Secondary | ICD-10-CM | POA: Diagnosis not present

## 2011-11-25 DIAGNOSIS — N390 Urinary tract infection, site not specified: Secondary | ICD-10-CM | POA: Diagnosis not present

## 2011-11-25 DIAGNOSIS — Z940 Kidney transplant status: Secondary | ICD-10-CM | POA: Diagnosis not present

## 2011-11-25 DIAGNOSIS — I10 Essential (primary) hypertension: Secondary | ICD-10-CM | POA: Diagnosis not present

## 2011-11-26 DIAGNOSIS — I10 Essential (primary) hypertension: Secondary | ICD-10-CM | POA: Diagnosis not present

## 2011-11-26 DIAGNOSIS — N390 Urinary tract infection, site not specified: Secondary | ICD-10-CM | POA: Diagnosis not present

## 2011-11-26 DIAGNOSIS — Z940 Kidney transplant status: Secondary | ICD-10-CM | POA: Diagnosis not present

## 2011-11-26 DIAGNOSIS — Z79899 Other long term (current) drug therapy: Secondary | ICD-10-CM | POA: Diagnosis not present

## 2011-11-26 DIAGNOSIS — B961 Klebsiella pneumoniae [K. pneumoniae] as the cause of diseases classified elsewhere: Secondary | ICD-10-CM | POA: Diagnosis not present

## 2011-11-27 DIAGNOSIS — Z79899 Other long term (current) drug therapy: Secondary | ICD-10-CM | POA: Diagnosis not present

## 2011-11-27 DIAGNOSIS — N390 Urinary tract infection, site not specified: Secondary | ICD-10-CM | POA: Diagnosis not present

## 2011-11-27 DIAGNOSIS — Z940 Kidney transplant status: Secondary | ICD-10-CM | POA: Diagnosis not present

## 2011-11-27 DIAGNOSIS — B961 Klebsiella pneumoniae [K. pneumoniae] as the cause of diseases classified elsewhere: Secondary | ICD-10-CM | POA: Diagnosis not present

## 2011-11-27 DIAGNOSIS — I10 Essential (primary) hypertension: Secondary | ICD-10-CM | POA: Diagnosis not present

## 2011-11-28 DIAGNOSIS — N390 Urinary tract infection, site not specified: Secondary | ICD-10-CM | POA: Diagnosis not present

## 2011-11-28 DIAGNOSIS — Z940 Kidney transplant status: Secondary | ICD-10-CM | POA: Diagnosis not present

## 2011-11-28 DIAGNOSIS — B961 Klebsiella pneumoniae [K. pneumoniae] as the cause of diseases classified elsewhere: Secondary | ICD-10-CM | POA: Diagnosis not present

## 2011-11-28 DIAGNOSIS — I10 Essential (primary) hypertension: Secondary | ICD-10-CM | POA: Diagnosis not present

## 2011-11-28 DIAGNOSIS — Z79899 Other long term (current) drug therapy: Secondary | ICD-10-CM | POA: Diagnosis not present

## 2011-11-29 DIAGNOSIS — N390 Urinary tract infection, site not specified: Secondary | ICD-10-CM | POA: Diagnosis not present

## 2011-11-29 DIAGNOSIS — Z940 Kidney transplant status: Secondary | ICD-10-CM | POA: Diagnosis not present

## 2011-11-29 DIAGNOSIS — I10 Essential (primary) hypertension: Secondary | ICD-10-CM | POA: Diagnosis not present

## 2011-11-29 DIAGNOSIS — Z79899 Other long term (current) drug therapy: Secondary | ICD-10-CM | POA: Diagnosis not present

## 2011-11-29 DIAGNOSIS — B961 Klebsiella pneumoniae [K. pneumoniae] as the cause of diseases classified elsewhere: Secondary | ICD-10-CM | POA: Diagnosis not present

## 2011-11-30 DIAGNOSIS — N390 Urinary tract infection, site not specified: Secondary | ICD-10-CM | POA: Diagnosis not present

## 2011-11-30 DIAGNOSIS — B961 Klebsiella pneumoniae [K. pneumoniae] as the cause of diseases classified elsewhere: Secondary | ICD-10-CM | POA: Diagnosis not present

## 2011-11-30 DIAGNOSIS — Z79899 Other long term (current) drug therapy: Secondary | ICD-10-CM | POA: Diagnosis not present

## 2011-11-30 DIAGNOSIS — I10 Essential (primary) hypertension: Secondary | ICD-10-CM | POA: Diagnosis not present

## 2011-11-30 DIAGNOSIS — Z940 Kidney transplant status: Secondary | ICD-10-CM | POA: Diagnosis not present

## 2011-12-01 DIAGNOSIS — Z79899 Other long term (current) drug therapy: Secondary | ICD-10-CM | POA: Diagnosis not present

## 2011-12-01 DIAGNOSIS — N390 Urinary tract infection, site not specified: Secondary | ICD-10-CM | POA: Diagnosis not present

## 2011-12-01 DIAGNOSIS — I10 Essential (primary) hypertension: Secondary | ICD-10-CM | POA: Diagnosis not present

## 2011-12-01 DIAGNOSIS — B961 Klebsiella pneumoniae [K. pneumoniae] as the cause of diseases classified elsewhere: Secondary | ICD-10-CM | POA: Diagnosis not present

## 2011-12-01 DIAGNOSIS — Z940 Kidney transplant status: Secondary | ICD-10-CM | POA: Diagnosis not present

## 2011-12-02 DIAGNOSIS — N390 Urinary tract infection, site not specified: Secondary | ICD-10-CM | POA: Diagnosis not present

## 2011-12-02 DIAGNOSIS — Z79899 Other long term (current) drug therapy: Secondary | ICD-10-CM | POA: Diagnosis not present

## 2011-12-02 DIAGNOSIS — Z940 Kidney transplant status: Secondary | ICD-10-CM | POA: Diagnosis not present

## 2011-12-02 DIAGNOSIS — R791 Abnormal coagulation profile: Secondary | ICD-10-CM | POA: Diagnosis not present

## 2011-12-02 DIAGNOSIS — B961 Klebsiella pneumoniae [K. pneumoniae] as the cause of diseases classified elsewhere: Secondary | ICD-10-CM | POA: Diagnosis not present

## 2011-12-02 DIAGNOSIS — I10 Essential (primary) hypertension: Secondary | ICD-10-CM | POA: Diagnosis not present

## 2011-12-03 DIAGNOSIS — Z79899 Other long term (current) drug therapy: Secondary | ICD-10-CM | POA: Diagnosis not present

## 2011-12-03 DIAGNOSIS — B961 Klebsiella pneumoniae [K. pneumoniae] as the cause of diseases classified elsewhere: Secondary | ICD-10-CM | POA: Diagnosis not present

## 2011-12-03 DIAGNOSIS — I10 Essential (primary) hypertension: Secondary | ICD-10-CM | POA: Diagnosis not present

## 2011-12-03 DIAGNOSIS — N390 Urinary tract infection, site not specified: Secondary | ICD-10-CM | POA: Diagnosis not present

## 2011-12-03 DIAGNOSIS — Z940 Kidney transplant status: Secondary | ICD-10-CM | POA: Diagnosis not present

## 2011-12-04 DIAGNOSIS — N390 Urinary tract infection, site not specified: Secondary | ICD-10-CM | POA: Diagnosis not present

## 2011-12-04 DIAGNOSIS — I10 Essential (primary) hypertension: Secondary | ICD-10-CM | POA: Diagnosis not present

## 2011-12-04 DIAGNOSIS — Z79899 Other long term (current) drug therapy: Secondary | ICD-10-CM | POA: Diagnosis not present

## 2011-12-04 DIAGNOSIS — Z940 Kidney transplant status: Secondary | ICD-10-CM | POA: Diagnosis not present

## 2011-12-04 DIAGNOSIS — B961 Klebsiella pneumoniae [K. pneumoniae] as the cause of diseases classified elsewhere: Secondary | ICD-10-CM | POA: Diagnosis not present

## 2011-12-05 DIAGNOSIS — I10 Essential (primary) hypertension: Secondary | ICD-10-CM | POA: Diagnosis not present

## 2011-12-05 DIAGNOSIS — N390 Urinary tract infection, site not specified: Secondary | ICD-10-CM | POA: Diagnosis not present

## 2011-12-05 DIAGNOSIS — B961 Klebsiella pneumoniae [K. pneumoniae] as the cause of diseases classified elsewhere: Secondary | ICD-10-CM | POA: Diagnosis not present

## 2011-12-05 DIAGNOSIS — Z940 Kidney transplant status: Secondary | ICD-10-CM | POA: Diagnosis not present

## 2011-12-05 DIAGNOSIS — Z79899 Other long term (current) drug therapy: Secondary | ICD-10-CM | POA: Diagnosis not present

## 2011-12-06 DIAGNOSIS — I10 Essential (primary) hypertension: Secondary | ICD-10-CM | POA: Diagnosis not present

## 2011-12-06 DIAGNOSIS — B961 Klebsiella pneumoniae [K. pneumoniae] as the cause of diseases classified elsewhere: Secondary | ICD-10-CM | POA: Diagnosis not present

## 2011-12-06 DIAGNOSIS — Z79899 Other long term (current) drug therapy: Secondary | ICD-10-CM | POA: Diagnosis not present

## 2011-12-06 DIAGNOSIS — Z940 Kidney transplant status: Secondary | ICD-10-CM | POA: Diagnosis not present

## 2011-12-06 DIAGNOSIS — N390 Urinary tract infection, site not specified: Secondary | ICD-10-CM | POA: Diagnosis not present

## 2011-12-07 DIAGNOSIS — N390 Urinary tract infection, site not specified: Secondary | ICD-10-CM | POA: Diagnosis not present

## 2011-12-07 DIAGNOSIS — I10 Essential (primary) hypertension: Secondary | ICD-10-CM | POA: Diagnosis not present

## 2011-12-07 DIAGNOSIS — B961 Klebsiella pneumoniae [K. pneumoniae] as the cause of diseases classified elsewhere: Secondary | ICD-10-CM | POA: Diagnosis not present

## 2011-12-07 DIAGNOSIS — Z940 Kidney transplant status: Secondary | ICD-10-CM | POA: Diagnosis not present

## 2011-12-07 DIAGNOSIS — N186 End stage renal disease: Secondary | ICD-10-CM | POA: Diagnosis not present

## 2011-12-07 DIAGNOSIS — Z79899 Other long term (current) drug therapy: Secondary | ICD-10-CM | POA: Diagnosis not present

## 2011-12-13 DIAGNOSIS — Z79899 Other long term (current) drug therapy: Secondary | ICD-10-CM | POA: Diagnosis not present

## 2011-12-13 DIAGNOSIS — Z940 Kidney transplant status: Secondary | ICD-10-CM | POA: Diagnosis not present

## 2011-12-13 DIAGNOSIS — N189 Chronic kidney disease, unspecified: Secondary | ICD-10-CM | POA: Diagnosis not present

## 2011-12-15 DIAGNOSIS — N189 Chronic kidney disease, unspecified: Secondary | ICD-10-CM | POA: Diagnosis not present

## 2011-12-15 DIAGNOSIS — Z940 Kidney transplant status: Secondary | ICD-10-CM | POA: Diagnosis not present

## 2011-12-15 DIAGNOSIS — Z79899 Other long term (current) drug therapy: Secondary | ICD-10-CM | POA: Diagnosis not present

## 2011-12-22 DIAGNOSIS — Z79899 Other long term (current) drug therapy: Secondary | ICD-10-CM | POA: Diagnosis not present

## 2011-12-22 DIAGNOSIS — N189 Chronic kidney disease, unspecified: Secondary | ICD-10-CM | POA: Diagnosis not present

## 2011-12-22 DIAGNOSIS — Z940 Kidney transplant status: Secondary | ICD-10-CM | POA: Diagnosis not present

## 2011-12-29 DIAGNOSIS — Z79899 Other long term (current) drug therapy: Secondary | ICD-10-CM | POA: Diagnosis not present

## 2011-12-29 DIAGNOSIS — Z940 Kidney transplant status: Secondary | ICD-10-CM | POA: Diagnosis not present

## 2011-12-29 DIAGNOSIS — N189 Chronic kidney disease, unspecified: Secondary | ICD-10-CM | POA: Diagnosis not present

## 2011-12-30 DIAGNOSIS — R509 Fever, unspecified: Secondary | ICD-10-CM | POA: Diagnosis not present

## 2011-12-30 DIAGNOSIS — Z940 Kidney transplant status: Secondary | ICD-10-CM | POA: Diagnosis not present

## 2011-12-30 DIAGNOSIS — Z79899 Other long term (current) drug therapy: Secondary | ICD-10-CM | POA: Diagnosis not present

## 2011-12-30 DIAGNOSIS — B9789 Other viral agents as the cause of diseases classified elsewhere: Secondary | ICD-10-CM | POA: Diagnosis not present

## 2011-12-30 DIAGNOSIS — M069 Rheumatoid arthritis, unspecified: Secondary | ICD-10-CM | POA: Diagnosis not present

## 2011-12-30 DIAGNOSIS — E878 Other disorders of electrolyte and fluid balance, not elsewhere classified: Secondary | ICD-10-CM | POA: Diagnosis not present

## 2011-12-30 DIAGNOSIS — Z5320 Procedure and treatment not carried out because of patient's decision for unspecified reasons: Secondary | ICD-10-CM | POA: Diagnosis not present

## 2011-12-30 DIAGNOSIS — N186 End stage renal disease: Secondary | ICD-10-CM | POA: Diagnosis not present

## 2011-12-31 DIAGNOSIS — Z79899 Other long term (current) drug therapy: Secondary | ICD-10-CM | POA: Diagnosis not present

## 2012-01-03 DIAGNOSIS — N390 Urinary tract infection, site not specified: Secondary | ICD-10-CM | POA: Diagnosis not present

## 2012-01-03 DIAGNOSIS — B961 Klebsiella pneumoniae [K. pneumoniae] as the cause of diseases classified elsewhere: Secondary | ICD-10-CM | POA: Diagnosis not present

## 2012-01-03 DIAGNOSIS — Z79899 Other long term (current) drug therapy: Secondary | ICD-10-CM | POA: Diagnosis not present

## 2012-01-03 DIAGNOSIS — R791 Abnormal coagulation profile: Secondary | ICD-10-CM | POA: Diagnosis not present

## 2012-01-03 DIAGNOSIS — Z940 Kidney transplant status: Secondary | ICD-10-CM | POA: Diagnosis not present

## 2012-01-04 DIAGNOSIS — N390 Urinary tract infection, site not specified: Secondary | ICD-10-CM | POA: Diagnosis not present

## 2012-01-05 DIAGNOSIS — IMO0002 Reserved for concepts with insufficient information to code with codable children: Secondary | ICD-10-CM | POA: Diagnosis not present

## 2012-01-05 DIAGNOSIS — Z79899 Other long term (current) drug therapy: Secondary | ICD-10-CM | POA: Diagnosis not present

## 2012-01-05 DIAGNOSIS — I120 Hypertensive chronic kidney disease with stage 5 chronic kidney disease or end stage renal disease: Secondary | ICD-10-CM | POA: Diagnosis not present

## 2012-01-05 DIAGNOSIS — B961 Klebsiella pneumoniae [K. pneumoniae] as the cause of diseases classified elsewhere: Secondary | ICD-10-CM | POA: Diagnosis not present

## 2012-01-05 DIAGNOSIS — Z9109 Other allergy status, other than to drugs and biological substances: Secondary | ICD-10-CM | POA: Diagnosis not present

## 2012-01-05 DIAGNOSIS — N1330 Unspecified hydronephrosis: Secondary | ICD-10-CM | POA: Diagnosis not present

## 2012-01-05 DIAGNOSIS — D631 Anemia in chronic kidney disease: Secondary | ICD-10-CM | POA: Diagnosis not present

## 2012-01-05 DIAGNOSIS — N185 Chronic kidney disease, stage 5: Secondary | ICD-10-CM | POA: Diagnosis not present

## 2012-01-05 DIAGNOSIS — Z888 Allergy status to other drugs, medicaments and biological substances status: Secondary | ICD-10-CM | POA: Diagnosis not present

## 2012-01-05 DIAGNOSIS — N2889 Other specified disorders of kidney and ureter: Secondary | ICD-10-CM | POA: Diagnosis not present

## 2012-01-05 DIAGNOSIS — Z940 Kidney transplant status: Secondary | ICD-10-CM | POA: Diagnosis not present

## 2012-01-05 DIAGNOSIS — M069 Rheumatoid arthritis, unspecified: Secondary | ICD-10-CM | POA: Diagnosis not present

## 2012-01-05 DIAGNOSIS — N200 Calculus of kidney: Secondary | ICD-10-CM | POA: Diagnosis not present

## 2012-01-05 DIAGNOSIS — N390 Urinary tract infection, site not specified: Secondary | ICD-10-CM | POA: Diagnosis not present

## 2012-01-05 DIAGNOSIS — Z7982 Long term (current) use of aspirin: Secondary | ICD-10-CM | POA: Diagnosis not present

## 2012-01-06 DIAGNOSIS — R935 Abnormal findings on diagnostic imaging of other abdominal regions, including retroperitoneum: Secondary | ICD-10-CM | POA: Diagnosis not present

## 2012-01-06 DIAGNOSIS — Z79899 Other long term (current) drug therapy: Secondary | ICD-10-CM | POA: Diagnosis not present

## 2012-01-06 DIAGNOSIS — N390 Urinary tract infection, site not specified: Secondary | ICD-10-CM | POA: Diagnosis not present

## 2012-01-06 DIAGNOSIS — Z940 Kidney transplant status: Secondary | ICD-10-CM | POA: Diagnosis not present

## 2012-01-07 DIAGNOSIS — Z940 Kidney transplant status: Secondary | ICD-10-CM | POA: Diagnosis not present

## 2012-01-07 DIAGNOSIS — Z79899 Other long term (current) drug therapy: Secondary | ICD-10-CM | POA: Diagnosis not present

## 2012-01-07 DIAGNOSIS — I10 Essential (primary) hypertension: Secondary | ICD-10-CM | POA: Diagnosis not present

## 2012-01-07 DIAGNOSIS — N390 Urinary tract infection, site not specified: Secondary | ICD-10-CM | POA: Diagnosis not present

## 2012-01-08 DIAGNOSIS — Z79899 Other long term (current) drug therapy: Secondary | ICD-10-CM | POA: Diagnosis not present

## 2012-01-08 DIAGNOSIS — Z940 Kidney transplant status: Secondary | ICD-10-CM | POA: Diagnosis not present

## 2012-01-08 DIAGNOSIS — N390 Urinary tract infection, site not specified: Secondary | ICD-10-CM | POA: Diagnosis not present

## 2012-01-08 DIAGNOSIS — I10 Essential (primary) hypertension: Secondary | ICD-10-CM | POA: Diagnosis not present

## 2012-01-09 DIAGNOSIS — Z79899 Other long term (current) drug therapy: Secondary | ICD-10-CM | POA: Diagnosis not present

## 2012-01-10 DIAGNOSIS — Z79899 Other long term (current) drug therapy: Secondary | ICD-10-CM | POA: Diagnosis not present

## 2012-01-10 DIAGNOSIS — N390 Urinary tract infection, site not specified: Secondary | ICD-10-CM | POA: Diagnosis not present

## 2012-01-10 DIAGNOSIS — Z940 Kidney transplant status: Secondary | ICD-10-CM | POA: Diagnosis not present

## 2012-01-10 DIAGNOSIS — N189 Chronic kidney disease, unspecified: Secondary | ICD-10-CM | POA: Diagnosis not present

## 2012-01-11 DIAGNOSIS — Z940 Kidney transplant status: Secondary | ICD-10-CM | POA: Diagnosis not present

## 2012-01-11 DIAGNOSIS — I10 Essential (primary) hypertension: Secondary | ICD-10-CM | POA: Diagnosis not present

## 2012-01-11 DIAGNOSIS — Z79899 Other long term (current) drug therapy: Secondary | ICD-10-CM | POA: Diagnosis not present

## 2012-01-11 DIAGNOSIS — N390 Urinary tract infection, site not specified: Secondary | ICD-10-CM | POA: Diagnosis not present

## 2012-01-12 DIAGNOSIS — Z940 Kidney transplant status: Secondary | ICD-10-CM | POA: Diagnosis not present

## 2012-01-12 DIAGNOSIS — N390 Urinary tract infection, site not specified: Secondary | ICD-10-CM | POA: Diagnosis not present

## 2012-01-12 DIAGNOSIS — N189 Chronic kidney disease, unspecified: Secondary | ICD-10-CM | POA: Diagnosis not present

## 2012-01-12 DIAGNOSIS — Z79899 Other long term (current) drug therapy: Secondary | ICD-10-CM | POA: Diagnosis not present

## 2012-01-13 DIAGNOSIS — N390 Urinary tract infection, site not specified: Secondary | ICD-10-CM | POA: Diagnosis not present

## 2012-01-13 DIAGNOSIS — Z940 Kidney transplant status: Secondary | ICD-10-CM | POA: Diagnosis not present

## 2012-01-13 DIAGNOSIS — I10 Essential (primary) hypertension: Secondary | ICD-10-CM | POA: Diagnosis not present

## 2012-01-13 DIAGNOSIS — Z79899 Other long term (current) drug therapy: Secondary | ICD-10-CM | POA: Diagnosis not present

## 2012-01-14 DIAGNOSIS — N390 Urinary tract infection, site not specified: Secondary | ICD-10-CM | POA: Diagnosis not present

## 2012-01-14 DIAGNOSIS — Z940 Kidney transplant status: Secondary | ICD-10-CM | POA: Diagnosis not present

## 2012-01-14 DIAGNOSIS — I10 Essential (primary) hypertension: Secondary | ICD-10-CM | POA: Diagnosis not present

## 2012-01-14 DIAGNOSIS — Z79899 Other long term (current) drug therapy: Secondary | ICD-10-CM | POA: Diagnosis not present

## 2012-01-15 DIAGNOSIS — I10 Essential (primary) hypertension: Secondary | ICD-10-CM | POA: Diagnosis not present

## 2012-01-15 DIAGNOSIS — Z940 Kidney transplant status: Secondary | ICD-10-CM | POA: Diagnosis not present

## 2012-01-15 DIAGNOSIS — N390 Urinary tract infection, site not specified: Secondary | ICD-10-CM | POA: Diagnosis not present

## 2012-01-15 DIAGNOSIS — Z79899 Other long term (current) drug therapy: Secondary | ICD-10-CM | POA: Diagnosis not present

## 2012-01-16 DIAGNOSIS — Z940 Kidney transplant status: Secondary | ICD-10-CM | POA: Diagnosis not present

## 2012-01-16 DIAGNOSIS — N390 Urinary tract infection, site not specified: Secondary | ICD-10-CM | POA: Diagnosis not present

## 2012-01-16 DIAGNOSIS — Z79899 Other long term (current) drug therapy: Secondary | ICD-10-CM | POA: Diagnosis not present

## 2012-01-17 DIAGNOSIS — Z940 Kidney transplant status: Secondary | ICD-10-CM | POA: Diagnosis not present

## 2012-01-17 DIAGNOSIS — Z79899 Other long term (current) drug therapy: Secondary | ICD-10-CM | POA: Diagnosis not present

## 2012-01-17 DIAGNOSIS — N189 Chronic kidney disease, unspecified: Secondary | ICD-10-CM | POA: Diagnosis not present

## 2012-01-20 DIAGNOSIS — Z940 Kidney transplant status: Secondary | ICD-10-CM | POA: Diagnosis not present

## 2012-01-20 DIAGNOSIS — N390 Urinary tract infection, site not specified: Secondary | ICD-10-CM | POA: Diagnosis not present

## 2012-01-20 DIAGNOSIS — A499 Bacterial infection, unspecified: Secondary | ICD-10-CM | POA: Diagnosis not present

## 2012-01-27 DIAGNOSIS — N390 Urinary tract infection, site not specified: Secondary | ICD-10-CM | POA: Diagnosis not present

## 2012-01-27 DIAGNOSIS — T8610 Unspecified complication of kidney transplant: Secondary | ICD-10-CM | POA: Diagnosis not present

## 2012-01-27 DIAGNOSIS — I10 Essential (primary) hypertension: Secondary | ICD-10-CM | POA: Diagnosis not present

## 2012-01-27 DIAGNOSIS — Z940 Kidney transplant status: Secondary | ICD-10-CM | POA: Diagnosis not present

## 2012-01-27 DIAGNOSIS — D631 Anemia in chronic kidney disease: Secondary | ICD-10-CM | POA: Diagnosis not present

## 2012-01-27 DIAGNOSIS — Z79899 Other long term (current) drug therapy: Secondary | ICD-10-CM | POA: Diagnosis not present

## 2012-02-06 DIAGNOSIS — I10 Essential (primary) hypertension: Secondary | ICD-10-CM | POA: Diagnosis not present

## 2012-02-06 DIAGNOSIS — B349 Viral infection, unspecified: Secondary | ICD-10-CM | POA: Diagnosis not present

## 2012-02-06 DIAGNOSIS — Z940 Kidney transplant status: Secondary | ICD-10-CM | POA: Diagnosis not present

## 2012-02-06 DIAGNOSIS — Z87440 Personal history of urinary (tract) infections: Secondary | ICD-10-CM | POA: Diagnosis not present

## 2012-02-06 DIAGNOSIS — Z79899 Other long term (current) drug therapy: Secondary | ICD-10-CM | POA: Diagnosis not present

## 2012-02-09 DIAGNOSIS — Z79899 Other long term (current) drug therapy: Secondary | ICD-10-CM | POA: Diagnosis not present

## 2012-02-09 DIAGNOSIS — N189 Chronic kidney disease, unspecified: Secondary | ICD-10-CM | POA: Diagnosis not present

## 2012-02-09 DIAGNOSIS — Z940 Kidney transplant status: Secondary | ICD-10-CM | POA: Diagnosis not present

## 2012-02-17 DIAGNOSIS — B349 Viral infection, unspecified: Secondary | ICD-10-CM | POA: Diagnosis not present

## 2012-02-17 DIAGNOSIS — Z79899 Other long term (current) drug therapy: Secondary | ICD-10-CM | POA: Diagnosis not present

## 2012-02-17 DIAGNOSIS — Z87440 Personal history of urinary (tract) infections: Secondary | ICD-10-CM | POA: Diagnosis not present

## 2012-02-17 DIAGNOSIS — Z940 Kidney transplant status: Secondary | ICD-10-CM | POA: Diagnosis not present

## 2012-02-20 DIAGNOSIS — Z0389 Encounter for observation for other suspected diseases and conditions ruled out: Secondary | ICD-10-CM | POA: Diagnosis not present

## 2012-02-20 DIAGNOSIS — I10 Essential (primary) hypertension: Secondary | ICD-10-CM | POA: Diagnosis not present

## 2012-02-20 DIAGNOSIS — Z79899 Other long term (current) drug therapy: Secondary | ICD-10-CM | POA: Diagnosis not present

## 2012-02-20 DIAGNOSIS — Z940 Kidney transplant status: Secondary | ICD-10-CM | POA: Diagnosis not present

## 2012-02-20 DIAGNOSIS — B259 Cytomegaloviral disease, unspecified: Secondary | ICD-10-CM | POA: Diagnosis not present

## 2012-02-23 DIAGNOSIS — Z940 Kidney transplant status: Secondary | ICD-10-CM | POA: Diagnosis not present

## 2012-02-23 DIAGNOSIS — N189 Chronic kidney disease, unspecified: Secondary | ICD-10-CM | POA: Diagnosis not present

## 2012-02-23 DIAGNOSIS — Z79899 Other long term (current) drug therapy: Secondary | ICD-10-CM | POA: Diagnosis not present

## 2012-03-06 DIAGNOSIS — Z940 Kidney transplant status: Secondary | ICD-10-CM | POA: Diagnosis not present

## 2012-03-06 DIAGNOSIS — E041 Nontoxic single thyroid nodule: Secondary | ICD-10-CM | POA: Diagnosis not present

## 2012-03-09 DIAGNOSIS — I10 Essential (primary) hypertension: Secondary | ICD-10-CM | POA: Diagnosis not present

## 2012-03-09 DIAGNOSIS — E041 Nontoxic single thyroid nodule: Secondary | ICD-10-CM | POA: Diagnosis not present

## 2012-03-09 DIAGNOSIS — Z940 Kidney transplant status: Secondary | ICD-10-CM | POA: Diagnosis not present

## 2012-03-09 DIAGNOSIS — B370 Candidal stomatitis: Secondary | ICD-10-CM | POA: Diagnosis not present

## 2012-03-09 DIAGNOSIS — Z48298 Encounter for aftercare following other organ transplant: Secondary | ICD-10-CM | POA: Diagnosis not present

## 2012-03-09 DIAGNOSIS — Z79899 Other long term (current) drug therapy: Secondary | ICD-10-CM | POA: Diagnosis not present

## 2012-03-13 DIAGNOSIS — N189 Chronic kidney disease, unspecified: Secondary | ICD-10-CM | POA: Diagnosis not present

## 2012-03-13 DIAGNOSIS — Z79899 Other long term (current) drug therapy: Secondary | ICD-10-CM | POA: Diagnosis not present

## 2012-03-13 DIAGNOSIS — Z940 Kidney transplant status: Secondary | ICD-10-CM | POA: Diagnosis not present

## 2012-03-22 DIAGNOSIS — N189 Chronic kidney disease, unspecified: Secondary | ICD-10-CM | POA: Diagnosis not present

## 2012-03-22 DIAGNOSIS — Z79899 Other long term (current) drug therapy: Secondary | ICD-10-CM | POA: Diagnosis not present

## 2012-03-22 DIAGNOSIS — Z940 Kidney transplant status: Secondary | ICD-10-CM | POA: Diagnosis not present

## 2012-03-29 DIAGNOSIS — E041 Nontoxic single thyroid nodule: Secondary | ICD-10-CM | POA: Diagnosis not present

## 2012-04-06 DIAGNOSIS — Z48298 Encounter for aftercare following other organ transplant: Secondary | ICD-10-CM | POA: Diagnosis not present

## 2012-04-06 DIAGNOSIS — Z79899 Other long term (current) drug therapy: Secondary | ICD-10-CM | POA: Diagnosis not present

## 2012-04-06 DIAGNOSIS — Z940 Kidney transplant status: Secondary | ICD-10-CM | POA: Diagnosis not present

## 2012-04-27 DIAGNOSIS — Z940 Kidney transplant status: Secondary | ICD-10-CM | POA: Diagnosis not present

## 2012-04-27 DIAGNOSIS — B370 Candidal stomatitis: Secondary | ICD-10-CM | POA: Diagnosis not present

## 2012-04-27 DIAGNOSIS — Z79899 Other long term (current) drug therapy: Secondary | ICD-10-CM | POA: Diagnosis not present

## 2012-04-27 DIAGNOSIS — B349 Viral infection, unspecified: Secondary | ICD-10-CM | POA: Diagnosis not present

## 2012-04-27 DIAGNOSIS — Z48298 Encounter for aftercare following other organ transplant: Secondary | ICD-10-CM | POA: Diagnosis not present

## 2012-05-01 DIAGNOSIS — Z79899 Other long term (current) drug therapy: Secondary | ICD-10-CM | POA: Diagnosis not present

## 2012-05-01 DIAGNOSIS — N189 Chronic kidney disease, unspecified: Secondary | ICD-10-CM | POA: Diagnosis not present

## 2012-05-01 DIAGNOSIS — Z940 Kidney transplant status: Secondary | ICD-10-CM | POA: Diagnosis not present

## 2012-05-09 DIAGNOSIS — N189 Chronic kidney disease, unspecified: Secondary | ICD-10-CM | POA: Diagnosis not present

## 2012-05-09 DIAGNOSIS — Z940 Kidney transplant status: Secondary | ICD-10-CM | POA: Diagnosis not present

## 2012-05-09 DIAGNOSIS — Z79899 Other long term (current) drug therapy: Secondary | ICD-10-CM | POA: Diagnosis not present

## 2012-05-16 ENCOUNTER — Ambulatory Visit (INDEPENDENT_AMBULATORY_CARE_PROVIDER_SITE_OTHER): Payer: Medicare Other | Admitting: Family Medicine

## 2012-05-16 ENCOUNTER — Encounter: Payer: Self-pay | Admitting: Family Medicine

## 2012-05-16 VITALS — BP 143/89 | HR 103 | Temp 98.4°F | Ht 63.0 in | Wt 127.0 lb

## 2012-05-16 DIAGNOSIS — I10 Essential (primary) hypertension: Secondary | ICD-10-CM

## 2012-05-16 DIAGNOSIS — Z940 Kidney transplant status: Secondary | ICD-10-CM

## 2012-05-16 DIAGNOSIS — B372 Candidiasis of skin and nail: Secondary | ICD-10-CM

## 2012-05-16 MED ORDER — NYSTATIN-TRIAMCINOLONE 100000-0.1 UNIT/GM-% EX OINT: TOPICAL_OINTMENT | Freq: Two times a day (BID) | CUTANEOUS | Status: DC

## 2012-05-16 MED ORDER — ASPIRIN 81 MG PO TBEC: 81.0000 mg | DELAYED_RELEASE_TABLET | Freq: Every day | ORAL | Status: AC

## 2012-05-16 MED ORDER — TACROLIMUS 1 MG PO CAPS: 8.0000 mg | ORAL_CAPSULE | Freq: Two times a day (BID) | ORAL | Status: DC

## 2012-05-16 MED ORDER — CIPROFLOXACIN HCL 500 MG PO TABS: 500.0000 mg | ORAL_TABLET | Freq: Every day | ORAL | Status: AC

## 2012-05-16 MED ORDER — LABETALOL HCL 100 MG PO TABS: 150.0000 mg | ORAL_TABLET | Freq: Two times a day (BID) | ORAL | Status: DC

## 2012-05-16 MED ORDER — MYCOPHENOLATE SODIUM 180 MG PO TBEC: 180.0000 mg | DELAYED_RELEASE_TABLET | Freq: Two times a day (BID) | ORAL | Status: DC

## 2012-05-16 MED ORDER — PREDNISONE 5 MG PO TABS: 5.0000 mg | ORAL_TABLET | Freq: Every day | ORAL | Status: AC

## 2012-05-16 NOTE — Patient Instructions (Addendum)
It is very good to see you. I'm giving you a cream for you to use on your feet twice daily. In addition to that I would like you to use regular lotion twice a day. It has been a pleasure getting to know you.

## 2012-05-17 DIAGNOSIS — I10 Essential (primary) hypertension: Secondary | ICD-10-CM | POA: Insufficient documentation

## 2012-05-17 DIAGNOSIS — B372 Candidiasis of skin and nail: Secondary | ICD-10-CM | POA: Insufficient documentation

## 2012-05-17 NOTE — Assessment & Plan Note (Signed)
Patient is on multitude of different medications at this time. Encourage patient to continue with her kidney specialist. It appears that her nephrologist is also taking care of a lot of her primary care issues. I discussed with her that I did not want to many people making too many changes and confusing the care. Told her that either she needs to have lab sent over here so we can adjust accordingly or otherwise maybe she should only see her nephrologist being that that is her primary concern at this time. Patient appears to be doing well but taste on the medications that she is taking I'm concerned that there may be potential for rejection.  Encourage patient to send [place] labs from her nephrologist.

## 2012-05-17 NOTE — Progress Notes (Signed)
Patient ID: Kathleen [name], female   DOB: 06/01/1966, 46 y.o.   MRN: [id number]   46 year old female with a past medical history significant for rheumatoid arthritis as well as kidney transplant coming in with rash on feet bilaterally. Patient states that she has had this for months and it has seemed to be increasing. Recently though patient has been put on the multiple different types of medications, please see medications for review, from her nephrologist secondary to her immune system seems to not be working as well. Patient states that this rash is starting to creep up mostly on the medial aspect of her ankles more red and significantly itchy. Patient denies any open sores any discharge any numbness or tingling in the area. Patient has tried some hydrocortisone over-the-counter which is helps somewhat.   Preventative care: Patient is due for cholesterol, vitamin D level as well as a basic metabolic panel from my records. Patient states that she normally has this drawn at her nephrologist. She will have be sent over to [place] so we can have them in the computer system.   Review of systems: Denies any fever, chills, tach pain out of the ordinary, numbness or tingling in extremities or weakness.  Physical exam: General: Well-developed,well-nourished,in no acute distress; alert,appropriate and cooperative throughout examination  Lungs: Normal respiratory effort, chest expands symmetrically. Lungs are clear to auscultation, no crackles or wheezes.  Heart: Normal rate and regular rhythm. S1 and S2 normal without gallop, murmur, click, rub or other extra sounds.  Abdomen:  large surgical scar over RLQ, well healed, soft and non-tender. soft and non-tender.  Ext: no edema, neurovascularly intact   Skin: ulnar deviation of hand bilateral. Severe arthritic changes Patient's feet bilaterally he has a rash but mostly has hyperpigmented changes secondary to minimal swelling as well as chronic erythema. Patient has  no open lesions but mostly maculopapular in nature appears to be consistent with Candida infection.

## 2012-05-17 NOTE — Assessment & Plan Note (Signed)
Patient rash is consistent with Candida. At this time we'll try triamcinolone and nystatin. Would like to avoid any more oral antibiotics secondary to the amount that she start he on. Likely this is more of an opportunistic infection and I wanted to get some labs today which patient declined. Patient states that she does have some 2 weeks ago and is going Friday to her kidney specialist to have more drawn. Encourage her to send [place] the lab results we'll make him. Told patient if red flags and when to seek medical attention.

## 2012-05-17 NOTE — Assessment & Plan Note (Signed)
Discuss with patient at length about the importance of blood pressure monitoring and encourage him to keep her systolic blood pressure around the 120. Patient states she has checked for it it has been fairly normal. Patient is only on labetalol right now. Patient's does not want [place] to change any regimen at this time especially without talking to her nephrologist. I can understand. Encourage her to continue to take the labetalol that she's on. Warned of red flags. For further care we'll definitely avoid any nephrotoxic medications and has another medication needs to be added would consider amlodipine.

## 2012-05-21 DIAGNOSIS — N189 Chronic kidney disease, unspecified: Secondary | ICD-10-CM | POA: Diagnosis not present

## 2012-05-21 DIAGNOSIS — Z79899 Other long term (current) drug therapy: Secondary | ICD-10-CM | POA: Diagnosis not present

## 2012-05-21 DIAGNOSIS — Z940 Kidney transplant status: Secondary | ICD-10-CM | POA: Diagnosis not present

## 2012-05-31 DIAGNOSIS — Z79899 Other long term (current) drug therapy: Secondary | ICD-10-CM | POA: Diagnosis not present

## 2012-05-31 DIAGNOSIS — N189 Chronic kidney disease, unspecified: Secondary | ICD-10-CM | POA: Diagnosis not present

## 2012-05-31 DIAGNOSIS — Z940 Kidney transplant status: Secondary | ICD-10-CM | POA: Diagnosis not present

## 2012-06-07 DIAGNOSIS — N189 Chronic kidney disease, unspecified: Secondary | ICD-10-CM | POA: Diagnosis not present

## 2012-06-07 DIAGNOSIS — Z940 Kidney transplant status: Secondary | ICD-10-CM | POA: Diagnosis not present

## 2012-06-07 DIAGNOSIS — Z79899 Other long term (current) drug therapy: Secondary | ICD-10-CM | POA: Diagnosis not present

## 2012-06-14 DIAGNOSIS — Z79899 Other long term (current) drug therapy: Secondary | ICD-10-CM | POA: Diagnosis not present

## 2012-06-14 DIAGNOSIS — N189 Chronic kidney disease, unspecified: Secondary | ICD-10-CM | POA: Diagnosis not present

## 2012-06-14 DIAGNOSIS — Z940 Kidney transplant status: Secondary | ICD-10-CM | POA: Diagnosis not present

## 2012-06-15 DIAGNOSIS — B349 Viral infection, unspecified: Secondary | ICD-10-CM | POA: Diagnosis not present

## 2012-06-15 DIAGNOSIS — Z940 Kidney transplant status: Secondary | ICD-10-CM | POA: Diagnosis not present

## 2012-06-15 DIAGNOSIS — I10 Essential (primary) hypertension: Secondary | ICD-10-CM | POA: Diagnosis not present

## 2012-06-15 DIAGNOSIS — B370 Candidal stomatitis: Secondary | ICD-10-CM | POA: Diagnosis not present

## 2012-06-15 DIAGNOSIS — Z48298 Encounter for aftercare following other organ transplant: Secondary | ICD-10-CM | POA: Diagnosis not present

## 2012-06-29 DIAGNOSIS — N189 Chronic kidney disease, unspecified: Secondary | ICD-10-CM | POA: Diagnosis not present

## 2012-06-29 DIAGNOSIS — Z940 Kidney transplant status: Secondary | ICD-10-CM | POA: Diagnosis not present

## 2012-06-29 DIAGNOSIS — Z79899 Other long term (current) drug therapy: Secondary | ICD-10-CM | POA: Diagnosis not present

## 2012-07-12 DIAGNOSIS — N189 Chronic kidney disease, unspecified: Secondary | ICD-10-CM | POA: Diagnosis not present

## 2012-07-12 DIAGNOSIS — Z79899 Other long term (current) drug therapy: Secondary | ICD-10-CM | POA: Diagnosis not present

## 2012-07-12 DIAGNOSIS — Z940 Kidney transplant status: Secondary | ICD-10-CM | POA: Diagnosis not present

## 2012-07-26 DIAGNOSIS — Z79899 Other long term (current) drug therapy: Secondary | ICD-10-CM | POA: Diagnosis not present

## 2012-07-26 DIAGNOSIS — Z940 Kidney transplant status: Secondary | ICD-10-CM | POA: Diagnosis not present

## 2012-08-09 DIAGNOSIS — Z940 Kidney transplant status: Secondary | ICD-10-CM | POA: Diagnosis not present

## 2012-08-09 DIAGNOSIS — Z79899 Other long term (current) drug therapy: Secondary | ICD-10-CM | POA: Diagnosis not present

## 2012-08-10 DIAGNOSIS — B349 Viral infection, unspecified: Secondary | ICD-10-CM | POA: Diagnosis not present

## 2012-08-10 DIAGNOSIS — B370 Candidal stomatitis: Secondary | ICD-10-CM | POA: Diagnosis not present

## 2012-08-10 DIAGNOSIS — Z940 Kidney transplant status: Secondary | ICD-10-CM | POA: Diagnosis not present

## 2012-08-10 DIAGNOSIS — Z79899 Other long term (current) drug therapy: Secondary | ICD-10-CM | POA: Diagnosis not present

## 2012-09-11 DIAGNOSIS — Z79899 Other long term (current) drug therapy: Secondary | ICD-10-CM | POA: Diagnosis not present

## 2012-09-11 DIAGNOSIS — Z940 Kidney transplant status: Secondary | ICD-10-CM | POA: Diagnosis not present

## 2012-10-04 DIAGNOSIS — Z940 Kidney transplant status: Secondary | ICD-10-CM | POA: Diagnosis not present

## 2012-10-04 DIAGNOSIS — Z79899 Other long term (current) drug therapy: Secondary | ICD-10-CM | POA: Diagnosis not present

## 2012-10-05 DIAGNOSIS — Z79899 Other long term (current) drug therapy: Secondary | ICD-10-CM | POA: Diagnosis not present

## 2012-10-05 DIAGNOSIS — B370 Candidal stomatitis: Secondary | ICD-10-CM | POA: Diagnosis not present

## 2012-10-05 DIAGNOSIS — I10 Essential (primary) hypertension: Secondary | ICD-10-CM | POA: Diagnosis not present

## 2012-10-05 DIAGNOSIS — Z940 Kidney transplant status: Secondary | ICD-10-CM | POA: Diagnosis not present

## 2012-10-11 DIAGNOSIS — E041 Nontoxic single thyroid nodule: Secondary | ICD-10-CM | POA: Diagnosis not present

## 2012-11-01 DIAGNOSIS — Z79899 Other long term (current) drug therapy: Secondary | ICD-10-CM | POA: Diagnosis not present

## 2012-11-01 DIAGNOSIS — Z940 Kidney transplant status: Secondary | ICD-10-CM | POA: Diagnosis not present

## 2012-11-02 DIAGNOSIS — Z940 Kidney transplant status: Secondary | ICD-10-CM | POA: Diagnosis not present

## 2012-11-02 DIAGNOSIS — B370 Candidal stomatitis: Secondary | ICD-10-CM | POA: Diagnosis not present

## 2012-11-02 DIAGNOSIS — B349 Viral infection, unspecified: Secondary | ICD-10-CM | POA: Diagnosis not present

## 2012-11-02 DIAGNOSIS — N039 Chronic nephritic syndrome with unspecified morphologic changes: Secondary | ICD-10-CM | POA: Diagnosis not present

## 2012-11-02 DIAGNOSIS — Z79899 Other long term (current) drug therapy: Secondary | ICD-10-CM | POA: Diagnosis not present

## 2012-11-02 DIAGNOSIS — I10 Essential (primary) hypertension: Secondary | ICD-10-CM | POA: Diagnosis not present

## 2012-11-02 DIAGNOSIS — D631 Anemia in chronic kidney disease: Secondary | ICD-10-CM | POA: Diagnosis not present

## 2012-11-06 DIAGNOSIS — Z79899 Other long term (current) drug therapy: Secondary | ICD-10-CM | POA: Diagnosis not present

## 2012-11-06 DIAGNOSIS — Z940 Kidney transplant status: Secondary | ICD-10-CM | POA: Diagnosis not present

## 2012-11-08 DIAGNOSIS — N920 Excessive and frequent menstruation with regular cycle: Secondary | ICD-10-CM | POA: Diagnosis not present

## 2012-11-19 DIAGNOSIS — Z940 Kidney transplant status: Secondary | ICD-10-CM | POA: Diagnosis not present

## 2012-11-19 DIAGNOSIS — Z79899 Other long term (current) drug therapy: Secondary | ICD-10-CM | POA: Diagnosis not present

## 2012-11-27 DIAGNOSIS — Z79899 Other long term (current) drug therapy: Secondary | ICD-10-CM | POA: Diagnosis not present

## 2012-11-27 DIAGNOSIS — Z940 Kidney transplant status: Secondary | ICD-10-CM | POA: Diagnosis not present

## 2012-12-20 DIAGNOSIS — Z940 Kidney transplant status: Secondary | ICD-10-CM | POA: Diagnosis not present

## 2012-12-20 DIAGNOSIS — Z79899 Other long term (current) drug therapy: Secondary | ICD-10-CM | POA: Diagnosis not present

## 2012-12-21 DIAGNOSIS — I10 Essential (primary) hypertension: Secondary | ICD-10-CM | POA: Diagnosis not present

## 2012-12-21 DIAGNOSIS — B349 Viral infection, unspecified: Secondary | ICD-10-CM | POA: Diagnosis not present

## 2012-12-21 DIAGNOSIS — Z940 Kidney transplant status: Secondary | ICD-10-CM | POA: Diagnosis not present

## 2012-12-25 DIAGNOSIS — M129 Arthropathy, unspecified: Secondary | ICD-10-CM | POA: Diagnosis not present

## 2012-12-25 DIAGNOSIS — Z7982 Long term (current) use of aspirin: Secondary | ICD-10-CM | POA: Diagnosis not present

## 2012-12-25 DIAGNOSIS — I120 Hypertensive chronic kidney disease with stage 5 chronic kidney disease or end stage renal disease: Secondary | ICD-10-CM | POA: Diagnosis not present

## 2012-12-25 DIAGNOSIS — L819 Disorder of pigmentation, unspecified: Secondary | ICD-10-CM | POA: Diagnosis not present

## 2012-12-25 DIAGNOSIS — K219 Gastro-esophageal reflux disease without esophagitis: Secondary | ICD-10-CM | POA: Diagnosis not present

## 2012-12-25 DIAGNOSIS — D631 Anemia in chronic kidney disease: Secondary | ICD-10-CM | POA: Diagnosis not present

## 2012-12-25 DIAGNOSIS — Z940 Kidney transplant status: Secondary | ICD-10-CM | POA: Diagnosis not present

## 2012-12-25 DIAGNOSIS — N926 Irregular menstruation, unspecified: Secondary | ICD-10-CM | POA: Diagnosis not present

## 2012-12-25 DIAGNOSIS — N186 End stage renal disease: Secondary | ICD-10-CM | POA: Diagnosis not present

## 2012-12-27 DIAGNOSIS — Z940 Kidney transplant status: Secondary | ICD-10-CM | POA: Diagnosis not present

## 2012-12-27 DIAGNOSIS — Z79899 Other long term (current) drug therapy: Secondary | ICD-10-CM | POA: Diagnosis not present

## 2012-12-27 DIAGNOSIS — N926 Irregular menstruation, unspecified: Secondary | ICD-10-CM | POA: Diagnosis not present

## 2013-01-01 DIAGNOSIS — Z940 Kidney transplant status: Secondary | ICD-10-CM | POA: Diagnosis not present

## 2013-01-01 DIAGNOSIS — Z79899 Other long term (current) drug therapy: Secondary | ICD-10-CM | POA: Diagnosis not present

## 2013-01-24 DIAGNOSIS — Z79899 Other long term (current) drug therapy: Secondary | ICD-10-CM | POA: Diagnosis not present

## 2013-01-24 DIAGNOSIS — Z940 Kidney transplant status: Secondary | ICD-10-CM | POA: Diagnosis not present

## 2013-02-05 ENCOUNTER — Encounter (HOSPITAL_COMMUNITY): Payer: Self-pay | Admitting: Cardiology

## 2013-02-05 ENCOUNTER — Emergency Department (HOSPITAL_COMMUNITY)
Admission: EM | Admit: 2013-02-05 | Discharge: 2013-02-05 | Disposition: A | Discharge status: Home / Self Care | Payer: Medicare Other | Attending: Emergency Medicine | Admitting: Emergency Medicine

## 2013-02-05 ENCOUNTER — Encounter (HOSPITAL_COMMUNITY): Payer: Self-pay | Admitting: Certified Registered"

## 2013-02-05 ENCOUNTER — Emergency Department (HOSPITAL_COMMUNITY): Payer: Medicare Other

## 2013-02-05 ENCOUNTER — Encounter (HOSPITAL_COMMUNITY)
Admission: EM | Disposition: A | Discharge status: Home / Self Care | Payer: Self-pay | Source: Home / Self Care | Attending: Emergency Medicine

## 2013-02-05 DIAGNOSIS — Z862 Personal history of diseases of the blood and blood-forming organs and certain disorders involving the immune mechanism: Secondary | ICD-10-CM | POA: Diagnosis not present

## 2013-02-05 DIAGNOSIS — I129 Hypertensive chronic kidney disease with stage 1 through stage 4 chronic kidney disease, or unspecified chronic kidney disease: Secondary | ICD-10-CM | POA: Diagnosis not present

## 2013-02-05 DIAGNOSIS — Y929 Unspecified place or not applicable: Secondary | ICD-10-CM | POA: Insufficient documentation

## 2013-02-05 DIAGNOSIS — Z7982 Long term (current) use of aspirin: Secondary | ICD-10-CM | POA: Insufficient documentation

## 2013-02-05 DIAGNOSIS — Z8739 Personal history of other diseases of the musculoskeletal system and connective tissue: Secondary | ICD-10-CM | POA: Diagnosis not present

## 2013-02-05 DIAGNOSIS — S8253XA Displaced fracture of medial malleolus of unspecified tibia, initial encounter for closed fracture: Secondary | ICD-10-CM | POA: Diagnosis not present

## 2013-02-05 DIAGNOSIS — Z79899 Other long term (current) drug therapy: Secondary | ICD-10-CM | POA: Diagnosis not present

## 2013-02-05 DIAGNOSIS — W010XXA Fall on same level from slipping, tripping and stumbling without subsequent striking against object, initial encounter: Secondary | ICD-10-CM | POA: Insufficient documentation

## 2013-02-05 DIAGNOSIS — S82109A Unspecified fracture of upper end of unspecified tibia, initial encounter for closed fracture: Secondary | ICD-10-CM | POA: Diagnosis not present

## 2013-02-05 DIAGNOSIS — S82209A Unspecified fracture of shaft of unspecified tibia, initial encounter for closed fracture: Secondary | ICD-10-CM | POA: Diagnosis not present

## 2013-02-05 DIAGNOSIS — Y9329 Activity, other involving ice and snow: Secondary | ICD-10-CM | POA: Insufficient documentation

## 2013-02-05 DIAGNOSIS — Z940 Kidney transplant status: Secondary | ICD-10-CM | POA: Insufficient documentation

## 2013-02-05 DIAGNOSIS — N189 Chronic kidney disease, unspecified: Secondary | ICD-10-CM | POA: Diagnosis not present

## 2013-02-05 DIAGNOSIS — Y9389 Activity, other specified: Secondary | ICD-10-CM | POA: Insufficient documentation

## 2013-02-05 DIAGNOSIS — S82131A Displaced fracture of medial condyle of right tibia, initial encounter for closed fracture: Secondary | ICD-10-CM

## 2013-02-05 SURGERY — CANCELLED PROCEDURE
Laterality: Right

## 2013-02-05 MED ORDER — ACETAMINOPHEN 325 MG PO TABS
650.0000 mg | ORAL_TABLET | Freq: Once | ORAL | Status: AC
  Administered 2013-02-05: 650 mg via ORAL
  Filled 2013-02-05: qty 2

## 2013-02-05 MED ORDER — HYDROCODONE-ACETAMINOPHEN 5-325 MG PO TABS: 1.0000 | ORAL_TABLET | ORAL | Status: DC | PRN

## 2013-02-05 SURGICAL SUPPLY — 47 items
BANDAGE GAUZE ELAST BULKY 4 IN (GAUZE/BANDAGES/DRESSINGS) ×2 IMPLANT
BLADE SURG 10 STRL SS (BLADE) ×2 IMPLANT
BLADE SURG ROTATE 9660 (MISCELLANEOUS) IMPLANT
BNDG COHESIVE 6X5 TAN STRL LF (GAUZE/BANDAGES/DRESSINGS) ×4 IMPLANT
CLEANER TIP ELECTROSURG 2X2 (MISCELLANEOUS) ×2 IMPLANT
CLOTH BEACON ORANGE TIMEOUT ST (SAFETY) ×2 IMPLANT
COVER MAYO STAND STRL (DRAPES) ×2 IMPLANT
CUFF TOURNIQUET SINGLE 34IN LL (TOURNIQUET CUFF) IMPLANT
CUFF TOURNIQUET SINGLE 44IN (TOURNIQUET CUFF) IMPLANT
DRAPE C-ARM 42X72 X-RAY (DRAPES) IMPLANT
DRAPE INCISE IOBAN 66X45 STRL (DRAPES) ×2 IMPLANT
DRAPE U-SHAPE 47X51 STRL (DRAPES) ×2 IMPLANT
DRSG ADAPTIC 3X8 NADH LF (GAUZE/BANDAGES/DRESSINGS) ×2 IMPLANT
DRSG PAD ABDOMINAL 8X10 ST (GAUZE/BANDAGES/DRESSINGS) ×4 IMPLANT
DURAPREP 26ML APPLICATOR (WOUND CARE) ×2 IMPLANT
ELECT REM PT RETURN 9FT ADLT (ELECTROSURGICAL) ×2
ELECTRODE REM PT RTRN 9FT ADLT (ELECTROSURGICAL) ×1 IMPLANT
EVACUATOR 1/8 PVC DRAIN (DRAIN) IMPLANT
GLOVE BIOGEL PI IND STRL 9 (GLOVE) ×1 IMPLANT
GLOVE BIOGEL PI INDICATOR 9 (GLOVE) ×1
GLOVE SURG ORTHO 9.0 STRL STRW (GLOVE) ×2 IMPLANT
GOWN PREVENTION PLUS XLARGE (GOWN DISPOSABLE) ×2 IMPLANT
GOWN SRG XL XLNG 56XLVL 4 (GOWN DISPOSABLE) ×2 IMPLANT
GOWN STRL NON-REIN XL XLG LVL4 (GOWN DISPOSABLE) ×2
KIT BASIN OR (CUSTOM PROCEDURE TRAY) ×2 IMPLANT
KIT ROOM TURNOVER OR (KITS) ×2 IMPLANT
MANIFOLD NEPTUNE II (INSTRUMENTS) ×2 IMPLANT
NEEDLE 22X1 1/2 (OR ONLY) (NEEDLE) ×2 IMPLANT
NS IRRIG 1000ML POUR BTL (IV SOLUTION) ×2 IMPLANT
PACK ORTHO EXTREMITY (CUSTOM PROCEDURE TRAY) ×2 IMPLANT
PAD ARMBOARD 7.5X6 YLW CONV (MISCELLANEOUS) ×4 IMPLANT
SPONGE GAUZE 4X4 12PLY (GAUZE/BANDAGES/DRESSINGS) ×4 IMPLANT
SPONGE LAP 18X18 X RAY DECT (DISPOSABLE) ×2 IMPLANT
STAPLER VISISTAT 35W (STAPLE) IMPLANT
STOCKINETTE IMPERVIOUS LG (DRAPES) ×2 IMPLANT
SUCTION FRAZIER TIP 10 FR DISP (SUCTIONS) ×2 IMPLANT
SUT ETHILON 2 0 PSLX (SUTURE) IMPLANT
SUT ETHILON 4 0 PS 2 18 (SUTURE) IMPLANT
SUT VIC AB 0 CTB1 27 (SUTURE) ×2 IMPLANT
SUT VIC AB 1 CTB1 27 (SUTURE) ×2 IMPLANT
SUT VIC AB 2-0 CTB1 (SUTURE) ×2 IMPLANT
SYR 20ML ECCENTRIC (SYRINGE) ×2 IMPLANT
TOWEL OR 17X24 6PK STRL BLUE (TOWEL DISPOSABLE) ×2 IMPLANT
TOWEL OR 17X26 10 PK STRL BLUE (TOWEL DISPOSABLE) ×2 IMPLANT
TUBE CONNECTING 12X1/4 (SUCTIONS) ×2 IMPLANT
WATER STERILE IRR 1000ML POUR (IV SOLUTION) ×4 IMPLANT
YANKAUER SUCT BULB TIP NO VENT (SUCTIONS) ×2 IMPLANT

## 2013-02-05 NOTE — ED Notes (Signed)
Davidison in to speak to pt and give her pics of her knee pt given water per dr order

## 2013-02-05 NOTE — H&P (Addendum)
Kathleen [name] is an 47 y.o. female.   Chief Complaint: Right lateral tibial plateau joint depression split fracture HPI: Patient is a 47 year old woman with rheumatoid arthritis hypertension multiple medical problems who fell sustaining the right lateral tibial plateau fracture.  Past Medical History  Diagnosis Date  . Hypertension   . Chronic kidney disease   . Rheumatoid arthritis of the hand   . Anemia     iron def,     Past Surgical History  Procedure Laterality Date  . Kiney transplant  2005  . Cesarean section      Family History  Problem Relation Age of Onset  . Diabetes Maternal Grandmother   . Cancer Maternal Grandmother 72   Social History:  reports that she has never smoked. She does not have any smokeless tobacco history on file. She reports that  drinks alcohol. She reports that she does not use illicit drugs.  Allergies:  Allergies  Allergen Reactions  . Adhesive (Tape) Other (See Comments)    Removes skin - use paper tape  . Benazepril Swelling    Lips      (Not in a hospital admission)  No results found for this or any previous visit (from the past 48 hour(s)). Ct Knee Right Wo Contrast  02/05/2013  *RADIOLOGY REPORT*  Clinical Data: The lateral tibial plateau fracture.  CT OF THE RIGHT KNEE WITHOUT CONTRAST  Technique:  Multidetector CT imaging was performed according to the standard protocol. Multiplanar CT image reconstructions were also generated.  Comparison: 02/05/2013  Findings: Lipohemarthrosis observed with a comminuted fracture of the tibial plateau posteriorly.  Multiple fragments are present and are impacted up to five millimeters with respect to expected position.  One fracture plane extends into the tibial surface of the proximal tibiofibular articulation, with a probable small marginal fracture of the tibia and fibula along this articulation.  The medial femoral condyle appears slightly posterior in position with respect to the medial tibial  plateau, which could potentially indicate an ACL tear.  No distal femoral fracture or patellar fracture is observed.  Joint fluid extends into a small Baker's cyst.  There is some stranding in the vicinity of the common peroneal nerve, but without entrapment or obvious signs of common peroneal nerve injury. Abnormal fluid infiltrates the popliteal space.  IMPRESSION:  1.  Impacted comminuted posterolateral tibial plateau fracture. This extends into the proximal tibiofibular articulation.  Cannot exclude subtle avulsion from the fibula. 2.  Lipohemarthrosis. 3.  More posterior than expected positioning of the medial femoral condyle with respect the medial tibial plateau - ACL tear not excluded.   Original Report Authenticated By: Van Clines, M.D.    Dg Knee Complete 4 Views Right  02/05/2013  *RADIOLOGY REPORT*  Clinical Data: Fall  RIGHT KNEE - COMPLETE 4+ VIEW  Comparison: None.  Findings: Mildly depressed fracture of the lateral tibial plateau. This appears to be the posterior aspect of the lateral tibial plateau.  There is a large joint effusion.  No other fracture. Joint spaces are maintained.  IMPRESSION: Mildly depressed fracture of the lateral tibial plateau.   Original Report Authenticated By: Carl Best, M.D.     Review of Systems  All other systems reviewed and are negative.    Blood pressure 133/75, pulse 83, temperature 98.2 F (36.8 C), temperature source Oral, resp. rate 18, last menstrual period 02/05/2013, SpO2 98.00%. Physical Exam  Examination patient has palpable pulses the skin is intact. Review of the CT and radiographs shows a split  joint depression posterior lateral tibial plateau fracture. Patient does have swelling around the right knee and does have some swelling in the anterior compartment. The anterior compartment is soft she has no pain with plantarflexion or dorsiflexion of the foot actively or passively.  Assessment/Plan Assessment: Split joint depression  lateral tibial plateau fracture posteriorly.  Plan. Recommended to the patient proceeding with open reduction internal fixation of the tibial plateau fracture, fasciotomies for the anterior compartment and  bone graft. Risks and benefits were discussed including infection neurovascular injury pain arthritis DVT need for additional surgery need for total knee replacement. Risk of compartment syndrome do to swelling in the anterior compartment. Patient states she understands and does not want to pursue surgery at this time. She states that she is concerned with surgical intervention she states she's had many bad experiences with surgery and medical management of her diseases. I discussed that she is at increased risk of developing a compartment syndrome and discussed the complications of this discussed the risks of arthritis and the risks of requiring a knee replacement. Patient states she understands the risks of intervention and states she strongly will not pursue surgical intervention at this time. We will place her in knee immobilizer recommended ice and elevation nonweightbearing crutches prescription for hydrocodone for pain. Again discussed the signs and symptoms of compartment syndrome and that she should call immediately as this is an emergency. Recommended her to call my office if she had any questions or change of mind that we could pursue surgical intervention at a later date.  DUDA,MARCUS V 02/05/2013, 3:39 PM

## 2013-02-05 NOTE — ED Notes (Signed)
States that she slipped going down  2 steps and tried to brace herself and twisted her rt knee which is swollen and tender pt states that she hates this hospital and normally she goes to [facility] but she could not get there this time  She was in to much pain pt texting on phone the entire time pa was in room when asked where she had kidney transplant she states " they do not do them here" and what does that have to do with her knee and she would discuss it w/ dr when he came in [place] states she is pa handling case

## 2013-02-05 NOTE — Progress Notes (Signed)
2:58 PM Pt is a 47 yo woman who hurt the right knee falling on ice.  She has swelling and tenderness over the right knee diffusely.  X-ray shows a lateral tibial plateau fracture, which was confirmed on CT.

## 2013-02-05 NOTE — ED Provider Notes (Signed)
History     CSN: [id number]  Arrival date & time 02/05/13  1125   First MD Initiated Contact with Patient 02/05/13 1141      Chief Complaint  Patient presents with  . Knee Pain    (Consider location/radiation/quality/duration/timing/severity/associated sxs/prior treatment) HPI Comments: Patient is a 45 female with a history of kidney transplant who presents with a right knee injury that occurred this morning when she was walking to her car. She reports slipping and on ice and felt her right knee buckle, causing sudden onset of sharp and severe pain without radiation. Patient reports associated swelling. Movement and weight bearing activity makes the pain worse. No alleviating factors. Patient did not try anything for pain relief.    Past Medical History  Diagnosis Date  . Hypertension   . Chronic kidney disease   . Rheumatoid arthritis of the hand   . Anemia     iron def,     Past Surgical History  Procedure Laterality Date  . Kiney transplant  2005  . Cesarean section      Family History  Problem Relation Age of Onset  . Diabetes Maternal Grandmother   . Cancer Maternal Grandmother 62    History  Substance Use Topics  . Smoking status: Never Smoker   . Smokeless tobacco: Not on file  . Alcohol Use: 0.0 oz/week    0 Glasses of wine per week    OB History   Grav Para Term Preterm Abortions TAB SAB Ect Mult Living                  Review of Systems  Musculoskeletal: Positive for joint swelling and arthralgias.  All other systems reviewed and are negative.    Allergies  Adhesive and Benazepril  Home Medications   Current Outpatient Rx  Name  Route  Sig  Dispense  Refill  . aspirin 81 MG EC tablet   Oral   Take 1 tablet (81 mg total) by mouth daily. Swallow whole.   30 tablet   12   . Ciprofloxacin (CIPRO PO)   Oral   Take 1 tablet by mouth daily.          Marland Kitchen labetalol (NORMODYNE) 100 MG tablet   Oral   Take 50 mg by mouth 2 (two) times  daily.         . Multiple Vitamin (MULTIVITAMIN WITH MINERALS) TABS   Oral   Take 1 tablet by mouth daily. Women vitamin         . Multiple Vitamins-Minerals (HAIR/SKIN/NAILS) TABS   Oral   Take 1 tablet by mouth daily.         . mycophenolate (MYFORTIC) 180 MG EC tablet   Oral   Take 360 mg by mouth 2 (two) times daily.         Marland Kitchen omeprazole (PRILOSEC) 20 MG capsule   Oral   Take 20 mg by mouth daily.         Marland Kitchen OVER THE COUNTER MEDICATION   Oral   Take 2 tablets by mouth daily. Natural Iron Tablet         . SODIUM BICARBONATE PO   Oral   Take 2 tablets by mouth 2 (two) times daily.         . Sulfamethoxazole-Trimethoprim (BACTRIM PO)   Oral   Take 1 tablet by mouth 3 (three) times a week. Take on Mon, Wed, Fri         . tacrolimus (  PROGRAF) 1 MG capsule   Oral   Take 1 mg by mouth daily. Take with 5mg  capsule for a total of 6 mg in the morning         . tacrolimus (PROGRAF) 5 MG capsule   Oral   Take 5 mg by mouth 2 (two) times daily.         Marland Kitchen amoxicillin (AMOXIL) 500 MG tablet   Oral   Take 500 mg by mouth 4 (four) times daily as needed (for dental pain).            BP 133/75  Pulse 83  Temp(Src) 98.2 F (36.8 C) (Oral)  Resp 18  SpO2 98%  LMP 02/05/2013  Physical Exam  Nursing note and vitals reviewed. Constitutional: She is oriented to person, place, and time. She appears well-developed and well-nourished. No distress.  HENT:  Head: Normocephalic and atraumatic.  Eyes: Conjunctivae and EOM are normal.  Neck: Normal range of motion. Neck supple.  Cardiovascular: Normal rate and regular rhythm.  Exam reveals no gallop and no friction rub.   No murmur heard. Pulmonary/Chest: Effort normal and breath sounds normal. She has no wheezes. She has no rales. She exhibits no tenderness.  Abdominal: Soft. There is no tenderness.  Musculoskeletal: Normal range of motion.  Generalized knee right knee swelling and tenderness to palpation.  Medial joint line tenderness to palpation. ROM limited due to pain and swelling.   Neurological: She is alert and oriented to person, place, and time. Coordination normal.  Speech is goal-oriented. Moves limbs without ataxia.   Skin: Skin is warm and dry.  Psychiatric: She has a normal mood and affect. Her behavior is normal.    ED Course  Procedures (including critical care time)  Labs Reviewed - No data to display Ct Knee Right Wo Contrast  02/05/2013  *RADIOLOGY REPORT*  Clinical Data: The lateral tibial plateau fracture.  CT OF THE RIGHT KNEE WITHOUT CONTRAST  Technique:  Multidetector CT imaging was performed according to the standard protocol. Multiplanar CT image reconstructions were also generated.  Comparison: 02/05/2013  Findings: Lipohemarthrosis observed with a comminuted fracture of the tibial plateau posteriorly.  Multiple fragments are present and are impacted up to five millimeters with respect to expected position.  One fracture plane extends into the tibial surface of the proximal tibiofibular articulation, with a probable small marginal fracture of the tibia and fibula along this articulation.  The medial femoral condyle appears slightly posterior in position with respect to the medial tibial plateau, which could potentially indicate an ACL tear.  No distal femoral fracture or patellar fracture is observed.  Joint fluid extends into a small Baker's cyst.  There is some stranding in the vicinity of the common peroneal nerve, but without entrapment or obvious signs of common peroneal nerve injury. Abnormal fluid infiltrates the popliteal space.  IMPRESSION:  1.  Impacted comminuted posterolateral tibial plateau fracture. This extends into the proximal tibiofibular articulation.  Cannot exclude subtle avulsion from the fibula. 2.  Lipohemarthrosis. 3.  More posterior than expected positioning of the medial femoral condyle with respect the medial tibial plateau - ACL tear not excluded.    Original Report Authenticated By: Van Clines, M.D.    Dg Knee Complete 4 Views Right  02/05/2013  *RADIOLOGY REPORT*  Clinical Data: Fall  RIGHT KNEE - COMPLETE 4+ VIEW  Comparison: None.  Findings: Mildly depressed fracture of the lateral tibial plateau. This appears to be the posterior aspect of the lateral tibial plateau.  There is a large joint effusion.  No other fracture. Joint spaces are maintained.  IMPRESSION: Mildly depressed fracture of the lateral tibial plateau.   Original Report Authenticated By: Carl Best, M.D.      1. Right medial tibial plateau fracture, closed, initial encounter       MDM  1:52 PM Xray shows tibial plateau fracture. Patient will have a CT scan of knee to better assess the fracture. Patient declined pain medication.   2:48 PM CT shows impacted comminuted posterolateral tibial plateau fracture. I will page Dr. Sharol Given for further disposition.         Alvina Chou, PA-C 02/08/13 1221

## 2013-02-05 NOTE — ED Notes (Signed)
Given another ice pack

## 2013-02-05 NOTE — ED Notes (Signed)
Pt notified that she can  Not have anything else to eat or drink until dr duda comes. To see her. Pt requesting to know what pain meds are available. Dr Bertram Denver into speak to pt about available pain meds

## 2013-02-05 NOTE — ED Notes (Signed)
Arrives to room 8 in fast track stating I hate this hospital. i introduced myself and ask pt to get on bed and take off boots and pants so  I can examine her knee, have gown in my hand. Pt staes she is going to wait and wants her son to help her

## 2013-02-05 NOTE — ED Notes (Signed)
Pt reports she was on her way to work and slipped on some ice. States she saw her knee buckle. Denies any LOC or hitting her head. Small abrasion noted to the right hand.

## 2013-02-05 NOTE — Progress Notes (Signed)
Orthopedic Tech Progress Note Patient Details:  Kathleen [name] 12-Mar-1966 [id number]  Ortho Devices Type of Ortho Device: Knee Immobilizer;Crutches Ortho Device/Splint Location: (R) LE Ortho Device/Splint Interventions: Application   Braulio Bosch 02/05/2013, 4:47 PM

## 2013-02-05 NOTE — ED Notes (Signed)
Pt given discharge paperwork; pt verbalized understanding of d/c and f/u; no additional questions by pt; VSS; resps e/u; e-signature obtained; pt wheeled out in wheelchair to vehicle.

## 2013-02-06 DIAGNOSIS — M19079 Primary osteoarthritis, unspecified ankle and foot: Secondary | ICD-10-CM | POA: Diagnosis not present

## 2013-02-06 DIAGNOSIS — I120 Hypertensive chronic kidney disease with stage 5 chronic kidney disease or end stage renal disease: Secondary | ICD-10-CM | POA: Diagnosis not present

## 2013-02-06 DIAGNOSIS — M25569 Pain in unspecified knee: Secondary | ICD-10-CM | POA: Diagnosis not present

## 2013-02-06 DIAGNOSIS — S8290XD Unspecified fracture of unspecified lower leg, subsequent encounter for closed fracture with routine healing: Secondary | ICD-10-CM | POA: Diagnosis not present

## 2013-02-06 DIAGNOSIS — S82209A Unspecified fracture of shaft of unspecified tibia, initial encounter for closed fracture: Secondary | ICD-10-CM | POA: Diagnosis not present

## 2013-02-06 DIAGNOSIS — M069 Rheumatoid arthritis, unspecified: Secondary | ICD-10-CM | POA: Diagnosis not present

## 2013-02-06 DIAGNOSIS — S82109A Unspecified fracture of upper end of unspecified tibia, initial encounter for closed fracture: Secondary | ICD-10-CM | POA: Diagnosis not present

## 2013-02-06 DIAGNOSIS — N186 End stage renal disease: Secondary | ICD-10-CM | POA: Diagnosis not present

## 2013-02-06 DIAGNOSIS — D631 Anemia in chronic kidney disease: Secondary | ICD-10-CM | POA: Diagnosis not present

## 2013-02-06 DIAGNOSIS — M79609 Pain in unspecified limb: Secondary | ICD-10-CM | POA: Diagnosis not present

## 2013-02-06 DIAGNOSIS — M25469 Effusion, unspecified knee: Secondary | ICD-10-CM | POA: Diagnosis not present

## 2013-02-06 DIAGNOSIS — I10 Essential (primary) hypertension: Secondary | ICD-10-CM | POA: Diagnosis not present

## 2013-02-06 DIAGNOSIS — Z940 Kidney transplant status: Secondary | ICD-10-CM | POA: Diagnosis not present

## 2013-02-06 DIAGNOSIS — B370 Candidal stomatitis: Secondary | ICD-10-CM | POA: Diagnosis not present

## 2013-02-06 DIAGNOSIS — M25559 Pain in unspecified hip: Secondary | ICD-10-CM | POA: Diagnosis not present

## 2013-02-06 MED FILL — Acetaminophen IV Soln 10 MG/ML: INTRAVENOUS | Qty: 100 | Status: AC

## 2013-02-07 DIAGNOSIS — Z01810 Encounter for preprocedural cardiovascular examination: Secondary | ICD-10-CM | POA: Diagnosis not present

## 2013-02-07 DIAGNOSIS — I10 Essential (primary) hypertension: Secondary | ICD-10-CM | POA: Diagnosis not present

## 2013-02-07 DIAGNOSIS — Z01818 Encounter for other preprocedural examination: Secondary | ICD-10-CM | POA: Diagnosis not present

## 2013-02-11 DIAGNOSIS — Z940 Kidney transplant status: Secondary | ICD-10-CM | POA: Diagnosis not present

## 2013-02-11 DIAGNOSIS — Z79899 Other long term (current) drug therapy: Secondary | ICD-10-CM | POA: Diagnosis not present

## 2013-02-11 NOTE — ED Provider Notes (Signed)
Medical screening examination/treatment/procedure(s) were conducted as a shared visit with non-physician practitioner(s) and myself.  I personally evaluated the patient during the encounter Pt is a 47 yo woman who hurt the right knee falling on ice. She has swelling and tenderness over the right knee diffusely. X-ray shows a lateral tibial plateau fracture, which was confirmed on CT.       Mylinda Latina III, MD 02/11/13 [phone]

## 2013-02-27 DIAGNOSIS — S8290XD Unspecified fracture of unspecified lower leg, subsequent encounter for closed fracture with routine healing: Secondary | ICD-10-CM | POA: Diagnosis not present

## 2013-02-27 DIAGNOSIS — Z09 Encounter for follow-up examination after completed treatment for conditions other than malignant neoplasm: Secondary | ICD-10-CM | POA: Diagnosis not present

## 2013-02-27 DIAGNOSIS — S82109A Unspecified fracture of upper end of unspecified tibia, initial encounter for closed fracture: Secondary | ICD-10-CM | POA: Diagnosis not present

## 2013-03-14 DIAGNOSIS — Z79899 Other long term (current) drug therapy: Secondary | ICD-10-CM | POA: Diagnosis not present

## 2013-03-14 DIAGNOSIS — Z940 Kidney transplant status: Secondary | ICD-10-CM | POA: Diagnosis not present

## 2013-03-18 DIAGNOSIS — B370 Candidal stomatitis: Secondary | ICD-10-CM | POA: Diagnosis not present

## 2013-03-18 DIAGNOSIS — M25669 Stiffness of unspecified knee, not elsewhere classified: Secondary | ICD-10-CM | POA: Diagnosis not present

## 2013-03-18 DIAGNOSIS — I10 Essential (primary) hypertension: Secondary | ICD-10-CM | POA: Diagnosis not present

## 2013-03-18 DIAGNOSIS — M6281 Muscle weakness (generalized): Secondary | ICD-10-CM | POA: Diagnosis not present

## 2013-03-18 DIAGNOSIS — Z940 Kidney transplant status: Secondary | ICD-10-CM | POA: Diagnosis not present

## 2013-03-18 DIAGNOSIS — IMO0001 Reserved for inherently not codable concepts without codable children: Secondary | ICD-10-CM | POA: Diagnosis not present

## 2013-03-18 DIAGNOSIS — S8290XD Unspecified fracture of unspecified lower leg, subsequent encounter for closed fracture with routine healing: Secondary | ICD-10-CM | POA: Diagnosis not present

## 2013-03-18 DIAGNOSIS — Z79899 Other long term (current) drug therapy: Secondary | ICD-10-CM | POA: Diagnosis not present

## 2013-03-18 DIAGNOSIS — R262 Difficulty in walking, not elsewhere classified: Secondary | ICD-10-CM | POA: Diagnosis not present

## 2013-03-18 DIAGNOSIS — M25569 Pain in unspecified knee: Secondary | ICD-10-CM | POA: Diagnosis not present

## 2013-03-26 DIAGNOSIS — M25669 Stiffness of unspecified knee, not elsewhere classified: Secondary | ICD-10-CM | POA: Diagnosis not present

## 2013-03-26 DIAGNOSIS — M6281 Muscle weakness (generalized): Secondary | ICD-10-CM | POA: Diagnosis not present

## 2013-03-26 DIAGNOSIS — IMO0001 Reserved for inherently not codable concepts without codable children: Secondary | ICD-10-CM | POA: Diagnosis not present

## 2013-03-26 DIAGNOSIS — S8290XD Unspecified fracture of unspecified lower leg, subsequent encounter for closed fracture with routine healing: Secondary | ICD-10-CM | POA: Diagnosis not present

## 2013-03-26 DIAGNOSIS — M25569 Pain in unspecified knee: Secondary | ICD-10-CM | POA: Diagnosis not present

## 2013-03-26 DIAGNOSIS — R262 Difficulty in walking, not elsewhere classified: Secondary | ICD-10-CM | POA: Diagnosis not present

## 2013-03-27 DIAGNOSIS — Z79899 Other long term (current) drug therapy: Secondary | ICD-10-CM | POA: Diagnosis not present

## 2013-03-27 DIAGNOSIS — Z940 Kidney transplant status: Secondary | ICD-10-CM | POA: Diagnosis not present

## 2013-03-27 DIAGNOSIS — S8290XD Unspecified fracture of unspecified lower leg, subsequent encounter for closed fracture with routine healing: Secondary | ICD-10-CM | POA: Diagnosis not present

## 2013-04-02 DIAGNOSIS — IMO0001 Reserved for inherently not codable concepts without codable children: Secondary | ICD-10-CM | POA: Diagnosis not present

## 2013-04-02 DIAGNOSIS — M25569 Pain in unspecified knee: Secondary | ICD-10-CM | POA: Diagnosis not present

## 2013-04-02 DIAGNOSIS — R262 Difficulty in walking, not elsewhere classified: Secondary | ICD-10-CM | POA: Diagnosis not present

## 2013-04-02 DIAGNOSIS — M25669 Stiffness of unspecified knee, not elsewhere classified: Secondary | ICD-10-CM | POA: Diagnosis not present

## 2013-04-02 DIAGNOSIS — M6281 Muscle weakness (generalized): Secondary | ICD-10-CM | POA: Diagnosis not present

## 2013-04-02 DIAGNOSIS — S8290XD Unspecified fracture of unspecified lower leg, subsequent encounter for closed fracture with routine healing: Secondary | ICD-10-CM | POA: Diagnosis not present

## 2013-04-09 DIAGNOSIS — R262 Difficulty in walking, not elsewhere classified: Secondary | ICD-10-CM | POA: Diagnosis not present

## 2013-04-09 DIAGNOSIS — IMO0001 Reserved for inherently not codable concepts without codable children: Secondary | ICD-10-CM | POA: Diagnosis not present

## 2013-04-09 DIAGNOSIS — M25569 Pain in unspecified knee: Secondary | ICD-10-CM | POA: Diagnosis not present

## 2013-04-09 DIAGNOSIS — S8290XD Unspecified fracture of unspecified lower leg, subsequent encounter for closed fracture with routine healing: Secondary | ICD-10-CM | POA: Diagnosis not present

## 2013-04-09 DIAGNOSIS — M25669 Stiffness of unspecified knee, not elsewhere classified: Secondary | ICD-10-CM | POA: Diagnosis not present

## 2013-04-09 DIAGNOSIS — M6281 Muscle weakness (generalized): Secondary | ICD-10-CM | POA: Diagnosis not present

## 2013-04-11 DIAGNOSIS — E041 Nontoxic single thyroid nodule: Secondary | ICD-10-CM | POA: Diagnosis not present

## 2013-04-18 DIAGNOSIS — S8290XD Unspecified fracture of unspecified lower leg, subsequent encounter for closed fracture with routine healing: Secondary | ICD-10-CM | POA: Diagnosis not present

## 2013-04-18 DIAGNOSIS — M25669 Stiffness of unspecified knee, not elsewhere classified: Secondary | ICD-10-CM | POA: Diagnosis not present

## 2013-04-18 DIAGNOSIS — R262 Difficulty in walking, not elsewhere classified: Secondary | ICD-10-CM | POA: Diagnosis not present

## 2013-04-18 DIAGNOSIS — IMO0001 Reserved for inherently not codable concepts without codable children: Secondary | ICD-10-CM | POA: Diagnosis not present

## 2013-04-18 DIAGNOSIS — M25569 Pain in unspecified knee: Secondary | ICD-10-CM | POA: Diagnosis not present

## 2013-04-18 DIAGNOSIS — M6281 Muscle weakness (generalized): Secondary | ICD-10-CM | POA: Diagnosis not present

## 2013-04-22 DIAGNOSIS — B351 Tinea unguium: Secondary | ICD-10-CM | POA: Diagnosis not present

## 2013-04-22 DIAGNOSIS — B353 Tinea pedis: Secondary | ICD-10-CM | POA: Diagnosis not present

## 2013-04-23 DIAGNOSIS — IMO0001 Reserved for inherently not codable concepts without codable children: Secondary | ICD-10-CM | POA: Diagnosis not present

## 2013-04-23 DIAGNOSIS — M25669 Stiffness of unspecified knee, not elsewhere classified: Secondary | ICD-10-CM | POA: Diagnosis not present

## 2013-04-23 DIAGNOSIS — M6281 Muscle weakness (generalized): Secondary | ICD-10-CM | POA: Diagnosis not present

## 2013-04-23 DIAGNOSIS — M25569 Pain in unspecified knee: Secondary | ICD-10-CM | POA: Diagnosis not present

## 2013-04-23 DIAGNOSIS — R262 Difficulty in walking, not elsewhere classified: Secondary | ICD-10-CM | POA: Diagnosis not present

## 2013-04-23 DIAGNOSIS — S8290XD Unspecified fracture of unspecified lower leg, subsequent encounter for closed fracture with routine healing: Secondary | ICD-10-CM | POA: Diagnosis not present

## 2013-04-25 DIAGNOSIS — M25569 Pain in unspecified knee: Secondary | ICD-10-CM | POA: Diagnosis not present

## 2013-04-25 DIAGNOSIS — IMO0001 Reserved for inherently not codable concepts without codable children: Secondary | ICD-10-CM | POA: Diagnosis not present

## 2013-04-25 DIAGNOSIS — R262 Difficulty in walking, not elsewhere classified: Secondary | ICD-10-CM | POA: Diagnosis not present

## 2013-04-25 DIAGNOSIS — M25669 Stiffness of unspecified knee, not elsewhere classified: Secondary | ICD-10-CM | POA: Diagnosis not present

## 2013-04-25 DIAGNOSIS — S8290XD Unspecified fracture of unspecified lower leg, subsequent encounter for closed fracture with routine healing: Secondary | ICD-10-CM | POA: Diagnosis not present

## 2013-04-25 DIAGNOSIS — M6281 Muscle weakness (generalized): Secondary | ICD-10-CM | POA: Diagnosis not present

## 2013-04-30 DIAGNOSIS — M25569 Pain in unspecified knee: Secondary | ICD-10-CM | POA: Diagnosis not present

## 2013-04-30 DIAGNOSIS — IMO0001 Reserved for inherently not codable concepts without codable children: Secondary | ICD-10-CM | POA: Diagnosis not present

## 2013-04-30 DIAGNOSIS — S8290XD Unspecified fracture of unspecified lower leg, subsequent encounter for closed fracture with routine healing: Secondary | ICD-10-CM | POA: Diagnosis not present

## 2013-04-30 DIAGNOSIS — M25669 Stiffness of unspecified knee, not elsewhere classified: Secondary | ICD-10-CM | POA: Diagnosis not present

## 2013-04-30 DIAGNOSIS — R262 Difficulty in walking, not elsewhere classified: Secondary | ICD-10-CM | POA: Diagnosis not present

## 2013-04-30 DIAGNOSIS — M6281 Muscle weakness (generalized): Secondary | ICD-10-CM | POA: Diagnosis not present

## 2013-05-01 DIAGNOSIS — IMO0002 Reserved for concepts with insufficient information to code with codable children: Secondary | ICD-10-CM | POA: Diagnosis not present

## 2013-05-01 DIAGNOSIS — S8290XD Unspecified fracture of unspecified lower leg, subsequent encounter for closed fracture with routine healing: Secondary | ICD-10-CM | POA: Diagnosis not present

## 2013-05-02 DIAGNOSIS — M25669 Stiffness of unspecified knee, not elsewhere classified: Secondary | ICD-10-CM | POA: Diagnosis not present

## 2013-05-02 DIAGNOSIS — Z940 Kidney transplant status: Secondary | ICD-10-CM | POA: Diagnosis not present

## 2013-05-02 DIAGNOSIS — S8290XD Unspecified fracture of unspecified lower leg, subsequent encounter for closed fracture with routine healing: Secondary | ICD-10-CM | POA: Diagnosis not present

## 2013-05-02 DIAGNOSIS — M25569 Pain in unspecified knee: Secondary | ICD-10-CM | POA: Diagnosis not present

## 2013-05-02 DIAGNOSIS — M6281 Muscle weakness (generalized): Secondary | ICD-10-CM | POA: Diagnosis not present

## 2013-05-02 DIAGNOSIS — R262 Difficulty in walking, not elsewhere classified: Secondary | ICD-10-CM | POA: Diagnosis not present

## 2013-05-02 DIAGNOSIS — Z79899 Other long term (current) drug therapy: Secondary | ICD-10-CM | POA: Diagnosis not present

## 2013-05-02 DIAGNOSIS — IMO0001 Reserved for inherently not codable concepts without codable children: Secondary | ICD-10-CM | POA: Diagnosis not present

## 2013-05-03 DIAGNOSIS — I10 Essential (primary) hypertension: Secondary | ICD-10-CM | POA: Diagnosis not present

## 2013-05-03 DIAGNOSIS — B349 Viral infection, unspecified: Secondary | ICD-10-CM | POA: Diagnosis not present

## 2013-05-03 DIAGNOSIS — M0510 Rheumatoid lung disease with rheumatoid arthritis of unspecified site: Secondary | ICD-10-CM | POA: Diagnosis not present

## 2013-05-03 DIAGNOSIS — N186 End stage renal disease: Secondary | ICD-10-CM | POA: Diagnosis not present

## 2013-05-03 DIAGNOSIS — I120 Hypertensive chronic kidney disease with stage 5 chronic kidney disease or end stage renal disease: Secondary | ICD-10-CM | POA: Diagnosis not present

## 2013-05-03 DIAGNOSIS — D631 Anemia in chronic kidney disease: Secondary | ICD-10-CM | POA: Diagnosis not present

## 2013-05-03 DIAGNOSIS — Z79899 Other long term (current) drug therapy: Secondary | ICD-10-CM | POA: Diagnosis not present

## 2013-05-03 DIAGNOSIS — Z87440 Personal history of urinary (tract) infections: Secondary | ICD-10-CM | POA: Diagnosis not present

## 2013-05-03 DIAGNOSIS — Z940 Kidney transplant status: Secondary | ICD-10-CM | POA: Diagnosis not present

## 2013-05-03 DIAGNOSIS — B370 Candidal stomatitis: Secondary | ICD-10-CM | POA: Diagnosis not present

## 2013-05-03 DIAGNOSIS — E041 Nontoxic single thyroid nodule: Secondary | ICD-10-CM | POA: Diagnosis not present

## 2013-05-03 DIAGNOSIS — M069 Rheumatoid arthritis, unspecified: Secondary | ICD-10-CM | POA: Diagnosis not present

## 2013-05-07 DIAGNOSIS — M25669 Stiffness of unspecified knee, not elsewhere classified: Secondary | ICD-10-CM | POA: Diagnosis not present

## 2013-05-07 DIAGNOSIS — M6281 Muscle weakness (generalized): Secondary | ICD-10-CM | POA: Diagnosis not present

## 2013-05-07 DIAGNOSIS — IMO0001 Reserved for inherently not codable concepts without codable children: Secondary | ICD-10-CM | POA: Diagnosis not present

## 2013-05-07 DIAGNOSIS — S8290XD Unspecified fracture of unspecified lower leg, subsequent encounter for closed fracture with routine healing: Secondary | ICD-10-CM | POA: Diagnosis not present

## 2013-05-07 DIAGNOSIS — M25569 Pain in unspecified knee: Secondary | ICD-10-CM | POA: Diagnosis not present

## 2013-05-07 DIAGNOSIS — R262 Difficulty in walking, not elsewhere classified: Secondary | ICD-10-CM | POA: Diagnosis not present

## 2013-05-09 DIAGNOSIS — M6281 Muscle weakness (generalized): Secondary | ICD-10-CM | POA: Diagnosis not present

## 2013-05-09 DIAGNOSIS — M25669 Stiffness of unspecified knee, not elsewhere classified: Secondary | ICD-10-CM | POA: Diagnosis not present

## 2013-05-09 DIAGNOSIS — IMO0001 Reserved for inherently not codable concepts without codable children: Secondary | ICD-10-CM | POA: Diagnosis not present

## 2013-05-09 DIAGNOSIS — R262 Difficulty in walking, not elsewhere classified: Secondary | ICD-10-CM | POA: Diagnosis not present

## 2013-05-09 DIAGNOSIS — S8290XD Unspecified fracture of unspecified lower leg, subsequent encounter for closed fracture with routine healing: Secondary | ICD-10-CM | POA: Diagnosis not present

## 2013-05-09 DIAGNOSIS — M25569 Pain in unspecified knee: Secondary | ICD-10-CM | POA: Diagnosis not present

## 2013-05-14 DIAGNOSIS — M6281 Muscle weakness (generalized): Secondary | ICD-10-CM | POA: Diagnosis not present

## 2013-05-14 DIAGNOSIS — IMO0001 Reserved for inherently not codable concepts without codable children: Secondary | ICD-10-CM | POA: Diagnosis not present

## 2013-05-14 DIAGNOSIS — R262 Difficulty in walking, not elsewhere classified: Secondary | ICD-10-CM | POA: Diagnosis not present

## 2013-05-14 DIAGNOSIS — M25569 Pain in unspecified knee: Secondary | ICD-10-CM | POA: Diagnosis not present

## 2013-05-14 DIAGNOSIS — S8290XD Unspecified fracture of unspecified lower leg, subsequent encounter for closed fracture with routine healing: Secondary | ICD-10-CM | POA: Diagnosis not present

## 2013-05-14 DIAGNOSIS — M25669 Stiffness of unspecified knee, not elsewhere classified: Secondary | ICD-10-CM | POA: Diagnosis not present

## 2013-05-16 DIAGNOSIS — R262 Difficulty in walking, not elsewhere classified: Secondary | ICD-10-CM | POA: Diagnosis not present

## 2013-05-16 DIAGNOSIS — S8290XD Unspecified fracture of unspecified lower leg, subsequent encounter for closed fracture with routine healing: Secondary | ICD-10-CM | POA: Diagnosis not present

## 2013-05-16 DIAGNOSIS — M25569 Pain in unspecified knee: Secondary | ICD-10-CM | POA: Diagnosis not present

## 2013-05-16 DIAGNOSIS — M6281 Muscle weakness (generalized): Secondary | ICD-10-CM | POA: Diagnosis not present

## 2013-05-16 DIAGNOSIS — IMO0001 Reserved for inherently not codable concepts without codable children: Secondary | ICD-10-CM | POA: Diagnosis not present

## 2013-05-16 DIAGNOSIS — M25669 Stiffness of unspecified knee, not elsewhere classified: Secondary | ICD-10-CM | POA: Diagnosis not present

## 2013-05-21 DIAGNOSIS — H1045 Other chronic allergic conjunctivitis: Secondary | ICD-10-CM | POA: Diagnosis not present

## 2013-05-21 DIAGNOSIS — H16109 Unspecified superficial keratitis, unspecified eye: Secondary | ICD-10-CM | POA: Diagnosis not present

## 2013-05-23 DIAGNOSIS — S8290XD Unspecified fracture of unspecified lower leg, subsequent encounter for closed fracture with routine healing: Secondary | ICD-10-CM | POA: Diagnosis not present

## 2013-05-23 DIAGNOSIS — IMO0001 Reserved for inherently not codable concepts without codable children: Secondary | ICD-10-CM | POA: Diagnosis not present

## 2013-05-23 DIAGNOSIS — R262 Difficulty in walking, not elsewhere classified: Secondary | ICD-10-CM | POA: Diagnosis not present

## 2013-05-30 ENCOUNTER — Emergency Department (HOSPITAL_COMMUNITY)
Admission: EM | Admit: 2013-05-30 | Discharge: 2013-05-30 | Discharge status: Absent without leave | Payer: Medicare Other | Source: Home / Self Care

## 2013-05-30 DIAGNOSIS — N039 Chronic nephritic syndrome with unspecified morphologic changes: Secondary | ICD-10-CM | POA: Diagnosis not present

## 2013-05-30 DIAGNOSIS — Z940 Kidney transplant status: Secondary | ICD-10-CM | POA: Diagnosis not present

## 2013-05-30 DIAGNOSIS — K560 Paralytic ileus: Secondary | ICD-10-CM | POA: Diagnosis not present

## 2013-05-30 DIAGNOSIS — Z5331 Laparoscopic surgical procedure converted to open procedure: Secondary | ICD-10-CM | POA: Diagnosis not present

## 2013-05-30 DIAGNOSIS — K929 Disease of digestive system, unspecified: Secondary | ICD-10-CM | POA: Diagnosis not present

## 2013-05-30 DIAGNOSIS — M069 Rheumatoid arthritis, unspecified: Secondary | ICD-10-CM | POA: Diagnosis present

## 2013-05-30 DIAGNOSIS — I120 Hypertensive chronic kidney disease with stage 5 chronic kidney disease or end stage renal disease: Secondary | ICD-10-CM | POA: Diagnosis not present

## 2013-05-30 DIAGNOSIS — D631 Anemia in chronic kidney disease: Secondary | ICD-10-CM | POA: Diagnosis not present

## 2013-05-30 DIAGNOSIS — E079 Disorder of thyroid, unspecified: Secondary | ICD-10-CM | POA: Diagnosis present

## 2013-05-30 DIAGNOSIS — N185 Chronic kidney disease, stage 5: Secondary | ICD-10-CM | POA: Diagnosis not present

## 2013-05-30 DIAGNOSIS — K219 Gastro-esophageal reflux disease without esophagitis: Secondary | ICD-10-CM | POA: Diagnosis present

## 2013-05-30 DIAGNOSIS — I10 Essential (primary) hypertension: Secondary | ICD-10-CM | POA: Diagnosis not present

## 2013-05-30 DIAGNOSIS — K3580 Unspecified acute appendicitis: Secondary | ICD-10-CM | POA: Diagnosis not present

## 2013-06-05 DIAGNOSIS — D899 Disorder involving the immune mechanism, unspecified: Secondary | ICD-10-CM | POA: Diagnosis not present

## 2013-06-05 DIAGNOSIS — N185 Chronic kidney disease, stage 5: Secondary | ICD-10-CM | POA: Diagnosis not present

## 2013-06-05 DIAGNOSIS — Z4801 Encounter for change or removal of surgical wound dressing: Secondary | ICD-10-CM | POA: Diagnosis not present

## 2013-06-05 DIAGNOSIS — Z48815 Encounter for surgical aftercare following surgery on the digestive system: Secondary | ICD-10-CM | POA: Diagnosis not present

## 2013-06-05 DIAGNOSIS — Z940 Kidney transplant status: Secondary | ICD-10-CM | POA: Diagnosis not present

## 2013-06-05 DIAGNOSIS — I120 Hypertensive chronic kidney disease with stage 5 chronic kidney disease or end stage renal disease: Secondary | ICD-10-CM | POA: Diagnosis not present

## 2013-06-07 DIAGNOSIS — Z48815 Encounter for surgical aftercare following surgery on the digestive system: Secondary | ICD-10-CM | POA: Diagnosis not present

## 2013-06-07 DIAGNOSIS — D899 Disorder involving the immune mechanism, unspecified: Secondary | ICD-10-CM | POA: Diagnosis not present

## 2013-06-07 DIAGNOSIS — N185 Chronic kidney disease, stage 5: Secondary | ICD-10-CM | POA: Diagnosis not present

## 2013-06-07 DIAGNOSIS — Z940 Kidney transplant status: Secondary | ICD-10-CM | POA: Diagnosis not present

## 2013-06-07 DIAGNOSIS — Z4801 Encounter for change or removal of surgical wound dressing: Secondary | ICD-10-CM | POA: Diagnosis not present

## 2013-06-07 DIAGNOSIS — I120 Hypertensive chronic kidney disease with stage 5 chronic kidney disease or end stage renal disease: Secondary | ICD-10-CM | POA: Diagnosis not present

## 2013-06-11 DIAGNOSIS — Z940 Kidney transplant status: Secondary | ICD-10-CM | POA: Diagnosis not present

## 2013-06-11 DIAGNOSIS — Z9689 Presence of other specified functional implants: Secondary | ICD-10-CM | POA: Diagnosis not present

## 2013-06-11 DIAGNOSIS — Z79899 Other long term (current) drug therapy: Secondary | ICD-10-CM | POA: Diagnosis not present

## 2013-06-11 DIAGNOSIS — K3580 Unspecified acute appendicitis: Secondary | ICD-10-CM | POA: Diagnosis not present

## 2013-06-13 DIAGNOSIS — D899 Disorder involving the immune mechanism, unspecified: Secondary | ICD-10-CM | POA: Diagnosis not present

## 2013-06-13 DIAGNOSIS — Z4801 Encounter for change or removal of surgical wound dressing: Secondary | ICD-10-CM | POA: Diagnosis not present

## 2013-06-13 DIAGNOSIS — I120 Hypertensive chronic kidney disease with stage 5 chronic kidney disease or end stage renal disease: Secondary | ICD-10-CM | POA: Diagnosis not present

## 2013-06-13 DIAGNOSIS — Z940 Kidney transplant status: Secondary | ICD-10-CM | POA: Diagnosis not present

## 2013-06-13 DIAGNOSIS — Z48815 Encounter for surgical aftercare following surgery on the digestive system: Secondary | ICD-10-CM | POA: Diagnosis not present

## 2013-06-13 DIAGNOSIS — N185 Chronic kidney disease, stage 5: Secondary | ICD-10-CM | POA: Diagnosis not present

## 2013-06-25 DIAGNOSIS — K3580 Unspecified acute appendicitis: Secondary | ICD-10-CM | POA: Diagnosis not present

## 2013-07-11 DIAGNOSIS — Z940 Kidney transplant status: Secondary | ICD-10-CM | POA: Diagnosis not present

## 2013-07-11 DIAGNOSIS — Z79899 Other long term (current) drug therapy: Secondary | ICD-10-CM | POA: Diagnosis not present

## 2013-07-12 DIAGNOSIS — Z79899 Other long term (current) drug therapy: Secondary | ICD-10-CM | POA: Diagnosis not present

## 2013-07-12 DIAGNOSIS — B349 Viral infection, unspecified: Secondary | ICD-10-CM | POA: Diagnosis not present

## 2013-07-12 DIAGNOSIS — I10 Essential (primary) hypertension: Secondary | ICD-10-CM | POA: Diagnosis not present

## 2013-07-12 DIAGNOSIS — Z940 Kidney transplant status: Secondary | ICD-10-CM | POA: Diagnosis not present

## 2013-08-05 DIAGNOSIS — S8290XD Unspecified fracture of unspecified lower leg, subsequent encounter for closed fracture with routine healing: Secondary | ICD-10-CM | POA: Diagnosis not present

## 2013-08-05 DIAGNOSIS — S82109A Unspecified fracture of upper end of unspecified tibia, initial encounter for closed fracture: Secondary | ICD-10-CM | POA: Diagnosis not present

## 2013-08-19 DIAGNOSIS — Z940 Kidney transplant status: Secondary | ICD-10-CM | POA: Diagnosis not present

## 2013-08-19 DIAGNOSIS — Z79899 Other long term (current) drug therapy: Secondary | ICD-10-CM | POA: Diagnosis not present

## 2013-09-04 DIAGNOSIS — B0229 Other postherpetic nervous system involvement: Secondary | ICD-10-CM | POA: Diagnosis not present

## 2013-09-04 DIAGNOSIS — B353 Tinea pedis: Secondary | ICD-10-CM | POA: Diagnosis not present

## 2013-09-04 DIAGNOSIS — L608 Other nail disorders: Secondary | ICD-10-CM | POA: Diagnosis not present

## 2013-09-09 DIAGNOSIS — Z940 Kidney transplant status: Secondary | ICD-10-CM | POA: Diagnosis not present

## 2013-09-09 DIAGNOSIS — Z79899 Other long term (current) drug therapy: Secondary | ICD-10-CM | POA: Diagnosis not present

## 2013-09-10 DIAGNOSIS — Z48298 Encounter for aftercare following other organ transplant: Secondary | ICD-10-CM | POA: Diagnosis not present

## 2013-09-10 DIAGNOSIS — N184 Chronic kidney disease, stage 4 (severe): Secondary | ICD-10-CM | POA: Diagnosis not present

## 2013-09-10 DIAGNOSIS — I129 Hypertensive chronic kidney disease with stage 1 through stage 4 chronic kidney disease, or unspecified chronic kidney disease: Secondary | ICD-10-CM | POA: Diagnosis not present

## 2013-09-10 DIAGNOSIS — Z23 Encounter for immunization: Secondary | ICD-10-CM | POA: Diagnosis not present

## 2013-09-10 DIAGNOSIS — B349 Viral infection, unspecified: Secondary | ICD-10-CM | POA: Diagnosis not present

## 2013-09-10 DIAGNOSIS — Z940 Kidney transplant status: Secondary | ICD-10-CM | POA: Diagnosis not present

## 2013-10-24 DIAGNOSIS — E042 Nontoxic multinodular goiter: Secondary | ICD-10-CM | POA: Diagnosis not present

## 2013-11-01 DIAGNOSIS — D899 Disorder involving the immune mechanism, unspecified: Secondary | ICD-10-CM | POA: Diagnosis not present

## 2013-11-01 DIAGNOSIS — Z79899 Other long term (current) drug therapy: Secondary | ICD-10-CM | POA: Diagnosis not present

## 2013-11-01 DIAGNOSIS — Z940 Kidney transplant status: Secondary | ICD-10-CM | POA: Diagnosis not present

## 2013-11-01 DIAGNOSIS — N184 Chronic kidney disease, stage 4 (severe): Secondary | ICD-10-CM | POA: Diagnosis not present

## 2013-11-01 DIAGNOSIS — I129 Hypertensive chronic kidney disease with stage 1 through stage 4 chronic kidney disease, or unspecified chronic kidney disease: Secondary | ICD-10-CM | POA: Diagnosis not present

## 2013-11-29 ENCOUNTER — Encounter: Payer: Self-pay | Admitting: Family Medicine

## 2013-11-29 DIAGNOSIS — E041 Nontoxic single thyroid nodule: Secondary | ICD-10-CM | POA: Insufficient documentation

## 2013-12-19 DIAGNOSIS — Z940 Kidney transplant status: Secondary | ICD-10-CM | POA: Diagnosis not present

## 2013-12-19 DIAGNOSIS — Z79899 Other long term (current) drug therapy: Secondary | ICD-10-CM | POA: Diagnosis not present

## 2013-12-27 DIAGNOSIS — I129 Hypertensive chronic kidney disease with stage 1 through stage 4 chronic kidney disease, or unspecified chronic kidney disease: Secondary | ICD-10-CM | POA: Diagnosis not present

## 2013-12-27 DIAGNOSIS — Z79899 Other long term (current) drug therapy: Secondary | ICD-10-CM | POA: Diagnosis not present

## 2013-12-27 DIAGNOSIS — D899 Disorder involving the immune mechanism, unspecified: Secondary | ICD-10-CM | POA: Diagnosis not present

## 2013-12-27 DIAGNOSIS — Z48298 Encounter for aftercare following other organ transplant: Secondary | ICD-10-CM | POA: Diagnosis not present

## 2013-12-27 DIAGNOSIS — Z940 Kidney transplant status: Secondary | ICD-10-CM | POA: Diagnosis not present

## 2013-12-27 DIAGNOSIS — N184 Chronic kidney disease, stage 4 (severe): Secondary | ICD-10-CM | POA: Diagnosis not present

## 2014-01-13 DIAGNOSIS — Z940 Kidney transplant status: Secondary | ICD-10-CM | POA: Diagnosis not present

## 2014-01-13 DIAGNOSIS — Z79899 Other long term (current) drug therapy: Secondary | ICD-10-CM | POA: Diagnosis not present

## 2014-02-06 DIAGNOSIS — Z940 Kidney transplant status: Secondary | ICD-10-CM | POA: Diagnosis not present

## 2014-02-06 DIAGNOSIS — Z79899 Other long term (current) drug therapy: Secondary | ICD-10-CM | POA: Diagnosis not present

## 2014-02-07 DIAGNOSIS — I129 Hypertensive chronic kidney disease with stage 1 through stage 4 chronic kidney disease, or unspecified chronic kidney disease: Secondary | ICD-10-CM | POA: Diagnosis not present

## 2014-02-07 DIAGNOSIS — N184 Chronic kidney disease, stage 4 (severe): Secondary | ICD-10-CM | POA: Diagnosis not present

## 2014-02-07 DIAGNOSIS — Z940 Kidney transplant status: Secondary | ICD-10-CM | POA: Diagnosis not present

## 2014-02-07 DIAGNOSIS — D899 Disorder involving the immune mechanism, unspecified: Secondary | ICD-10-CM | POA: Diagnosis not present

## 2014-02-12 DIAGNOSIS — S8290XD Unspecified fracture of unspecified lower leg, subsequent encounter for closed fracture with routine healing: Secondary | ICD-10-CM | POA: Diagnosis not present

## 2014-02-12 DIAGNOSIS — S82109A Unspecified fracture of upper end of unspecified tibia, initial encounter for closed fracture: Secondary | ICD-10-CM | POA: Diagnosis not present

## 2014-02-20 DIAGNOSIS — Z940 Kidney transplant status: Secondary | ICD-10-CM | POA: Diagnosis not present

## 2014-02-20 DIAGNOSIS — Z79899 Other long term (current) drug therapy: Secondary | ICD-10-CM | POA: Diagnosis not present

## 2014-03-28 DIAGNOSIS — Z79899 Other long term (current) drug therapy: Secondary | ICD-10-CM | POA: Diagnosis not present

## 2014-03-28 DIAGNOSIS — B349 Viral infection, unspecified: Secondary | ICD-10-CM | POA: Diagnosis not present

## 2014-03-28 DIAGNOSIS — I129 Hypertensive chronic kidney disease with stage 1 through stage 4 chronic kidney disease, or unspecified chronic kidney disease: Secondary | ICD-10-CM | POA: Diagnosis not present

## 2014-03-28 DIAGNOSIS — D899 Disorder involving the immune mechanism, unspecified: Secondary | ICD-10-CM | POA: Diagnosis not present

## 2014-03-28 DIAGNOSIS — N184 Chronic kidney disease, stage 4 (severe): Secondary | ICD-10-CM | POA: Diagnosis not present

## 2014-03-28 DIAGNOSIS — Z940 Kidney transplant status: Secondary | ICD-10-CM | POA: Diagnosis not present

## 2014-04-08 DIAGNOSIS — Z940 Kidney transplant status: Secondary | ICD-10-CM | POA: Diagnosis not present

## 2014-04-08 DIAGNOSIS — Z79899 Other long term (current) drug therapy: Secondary | ICD-10-CM | POA: Diagnosis not present

## 2014-06-04 DIAGNOSIS — E041 Nontoxic single thyroid nodule: Secondary | ICD-10-CM | POA: Diagnosis not present

## 2014-06-05 DIAGNOSIS — Z940 Kidney transplant status: Secondary | ICD-10-CM | POA: Diagnosis not present

## 2014-06-05 DIAGNOSIS — Z79899 Other long term (current) drug therapy: Secondary | ICD-10-CM | POA: Diagnosis not present

## 2014-06-05 DIAGNOSIS — Z9483 Pancreas transplant status: Secondary | ICD-10-CM | POA: Diagnosis not present

## 2014-06-12 DIAGNOSIS — Z940 Kidney transplant status: Secondary | ICD-10-CM | POA: Diagnosis not present

## 2014-06-12 DIAGNOSIS — Z79899 Other long term (current) drug therapy: Secondary | ICD-10-CM | POA: Diagnosis not present

## 2014-06-12 DIAGNOSIS — Z9483 Pancreas transplant status: Secondary | ICD-10-CM | POA: Diagnosis not present

## 2014-06-20 DIAGNOSIS — Z79899 Other long term (current) drug therapy: Secondary | ICD-10-CM | POA: Diagnosis not present

## 2014-06-20 DIAGNOSIS — I10 Essential (primary) hypertension: Secondary | ICD-10-CM | POA: Diagnosis not present

## 2014-06-20 DIAGNOSIS — E872 Acidosis, unspecified: Secondary | ICD-10-CM | POA: Diagnosis not present

## 2014-06-20 DIAGNOSIS — D899 Disorder involving the immune mechanism, unspecified: Secondary | ICD-10-CM | POA: Diagnosis not present

## 2014-06-20 DIAGNOSIS — Z940 Kidney transplant status: Secondary | ICD-10-CM | POA: Diagnosis not present

## 2014-06-20 DIAGNOSIS — B349 Viral infection, unspecified: Secondary | ICD-10-CM | POA: Diagnosis not present

## 2014-07-30 DIAGNOSIS — Z940 Kidney transplant status: Secondary | ICD-10-CM | POA: Diagnosis not present

## 2014-07-30 DIAGNOSIS — Z79899 Other long term (current) drug therapy: Secondary | ICD-10-CM | POA: Diagnosis not present

## 2014-08-01 DIAGNOSIS — Z79899 Other long term (current) drug therapy: Secondary | ICD-10-CM | POA: Diagnosis not present

## 2014-08-01 DIAGNOSIS — I10 Essential (primary) hypertension: Secondary | ICD-10-CM | POA: Diagnosis not present

## 2014-08-01 DIAGNOSIS — Z940 Kidney transplant status: Secondary | ICD-10-CM | POA: Diagnosis not present

## 2014-08-01 DIAGNOSIS — D899 Disorder involving the immune mechanism, unspecified: Secondary | ICD-10-CM | POA: Diagnosis not present

## 2014-08-01 DIAGNOSIS — B349 Viral infection, unspecified: Secondary | ICD-10-CM | POA: Diagnosis not present

## 2014-08-06 DIAGNOSIS — I129 Hypertensive chronic kidney disease with stage 1 through stage 4 chronic kidney disease, or unspecified chronic kidney disease: Secondary | ICD-10-CM | POA: Diagnosis not present

## 2014-08-06 DIAGNOSIS — N184 Chronic kidney disease, stage 4 (severe): Secondary | ICD-10-CM | POA: Diagnosis not present

## 2014-08-06 DIAGNOSIS — Z87440 Personal history of urinary (tract) infections: Secondary | ICD-10-CM | POA: Diagnosis not present

## 2014-08-06 DIAGNOSIS — Z79899 Other long term (current) drug therapy: Secondary | ICD-10-CM | POA: Diagnosis not present

## 2014-08-06 DIAGNOSIS — E041 Nontoxic single thyroid nodule: Secondary | ICD-10-CM | POA: Diagnosis not present

## 2014-08-06 DIAGNOSIS — E8881 Metabolic syndrome: Secondary | ICD-10-CM | POA: Diagnosis not present

## 2014-08-06 DIAGNOSIS — M069 Rheumatoid arthritis, unspecified: Secondary | ICD-10-CM | POA: Diagnosis not present

## 2014-08-06 DIAGNOSIS — D631 Anemia in chronic kidney disease: Secondary | ICD-10-CM | POA: Diagnosis not present

## 2014-08-06 DIAGNOSIS — IMO0002 Reserved for concepts with insufficient information to code with codable children: Secondary | ICD-10-CM | POA: Diagnosis not present

## 2014-08-06 DIAGNOSIS — D8989 Other specified disorders involving the immune mechanism, not elsewhere classified: Secondary | ICD-10-CM | POA: Diagnosis not present

## 2014-08-06 DIAGNOSIS — Z940 Kidney transplant status: Secondary | ICD-10-CM | POA: Diagnosis not present

## 2014-08-06 DIAGNOSIS — Z7901 Long term (current) use of anticoagulants: Secondary | ICD-10-CM | POA: Diagnosis not present

## 2014-09-05 DIAGNOSIS — Z940 Kidney transplant status: Secondary | ICD-10-CM | POA: Diagnosis not present

## 2014-09-05 DIAGNOSIS — Z79899 Other long term (current) drug therapy: Secondary | ICD-10-CM | POA: Diagnosis not present

## 2014-10-02 DIAGNOSIS — Z79899 Other long term (current) drug therapy: Secondary | ICD-10-CM | POA: Diagnosis not present

## 2014-10-10 DIAGNOSIS — Z940 Kidney transplant status: Secondary | ICD-10-CM | POA: Diagnosis not present

## 2014-10-10 DIAGNOSIS — Z79899 Other long term (current) drug therapy: Secondary | ICD-10-CM | POA: Diagnosis not present

## 2014-10-20 DIAGNOSIS — M05842 Other rheumatoid arthritis with rheumatoid factor of left hand: Secondary | ICD-10-CM | POA: Diagnosis not present

## 2014-10-20 DIAGNOSIS — M05841 Other rheumatoid arthritis with rheumatoid factor of right hand: Secondary | ICD-10-CM | POA: Diagnosis not present

## 2014-11-07 DIAGNOSIS — Z79899 Other long term (current) drug therapy: Secondary | ICD-10-CM | POA: Diagnosis not present

## 2014-11-07 DIAGNOSIS — D8989 Other specified disorders involving the immune mechanism, not elsewhere classified: Secondary | ICD-10-CM | POA: Diagnosis not present

## 2014-11-07 DIAGNOSIS — Z940 Kidney transplant status: Secondary | ICD-10-CM | POA: Diagnosis not present

## 2014-11-07 DIAGNOSIS — N039 Chronic nephritic syndrome with unspecified morphologic changes: Secondary | ICD-10-CM | POA: Diagnosis not present

## 2014-11-07 DIAGNOSIS — B349 Viral infection, unspecified: Secondary | ICD-10-CM | POA: Diagnosis not present

## 2014-11-07 DIAGNOSIS — D631 Anemia in chronic kidney disease: Secondary | ICD-10-CM | POA: Diagnosis not present

## 2014-11-07 DIAGNOSIS — I10 Essential (primary) hypertension: Secondary | ICD-10-CM | POA: Diagnosis not present

## 2014-11-10 DIAGNOSIS — Z01810 Encounter for preprocedural cardiovascular examination: Secondary | ICD-10-CM | POA: Diagnosis not present

## 2014-11-27 DIAGNOSIS — S63266A Dislocation of metacarpophalangeal joint of right little finger, initial encounter: Secondary | ICD-10-CM | POA: Diagnosis not present

## 2014-11-27 DIAGNOSIS — I120 Hypertensive chronic kidney disease with stage 5 chronic kidney disease or end stage renal disease: Secondary | ICD-10-CM | POA: Diagnosis not present

## 2014-11-27 DIAGNOSIS — S63262A Dislocation of metacarpophalangeal joint of right middle finger, initial encounter: Secondary | ICD-10-CM | POA: Diagnosis not present

## 2014-11-27 DIAGNOSIS — M06841 Other specified rheumatoid arthritis, right hand: Secondary | ICD-10-CM | POA: Diagnosis not present

## 2014-11-27 DIAGNOSIS — Z8249 Family history of ischemic heart disease and other diseases of the circulatory system: Secondary | ICD-10-CM | POA: Diagnosis not present

## 2014-11-27 DIAGNOSIS — M069 Rheumatoid arthritis, unspecified: Secondary | ICD-10-CM | POA: Diagnosis not present

## 2014-11-27 DIAGNOSIS — K219 Gastro-esophageal reflux disease without esophagitis: Secondary | ICD-10-CM | POA: Diagnosis not present

## 2014-11-27 DIAGNOSIS — S63114A Dislocation of metacarpophalangeal joint of right thumb, initial encounter: Secondary | ICD-10-CM | POA: Diagnosis not present

## 2014-11-27 DIAGNOSIS — D631 Anemia in chronic kidney disease: Secondary | ICD-10-CM | POA: Diagnosis not present

## 2014-11-27 DIAGNOSIS — S63260A Dislocation of metacarpophalangeal joint of right index finger, initial encounter: Secondary | ICD-10-CM | POA: Diagnosis not present

## 2014-11-27 DIAGNOSIS — S63264A Dislocation of metacarpophalangeal joint of right ring finger, initial encounter: Secondary | ICD-10-CM | POA: Diagnosis not present

## 2014-11-27 DIAGNOSIS — N186 End stage renal disease: Secondary | ICD-10-CM | POA: Diagnosis not present

## 2014-11-27 DIAGNOSIS — M24341 Pathological dislocation of right hand, not elsewhere classified: Secondary | ICD-10-CM | POA: Diagnosis not present

## 2014-11-27 DIAGNOSIS — M79644 Pain in right finger(s): Secondary | ICD-10-CM | POA: Diagnosis not present

## 2014-11-27 HISTORY — PX: HAND SURGERY: SHX662

## 2014-12-04 ENCOUNTER — Encounter (HOSPITAL_COMMUNITY): Payer: Self-pay | Admitting: Orthopedic Surgery

## 2014-12-05 DIAGNOSIS — Z79899 Other long term (current) drug therapy: Secondary | ICD-10-CM | POA: Diagnosis not present

## 2014-12-11 DIAGNOSIS — Z5189 Encounter for other specified aftercare: Secondary | ICD-10-CM | POA: Diagnosis not present

## 2014-12-11 DIAGNOSIS — Z4889 Encounter for other specified surgical aftercare: Secondary | ICD-10-CM | POA: Diagnosis not present

## 2014-12-11 DIAGNOSIS — M1990 Unspecified osteoarthritis, unspecified site: Secondary | ICD-10-CM | POA: Diagnosis not present

## 2014-12-11 DIAGNOSIS — M05841 Other rheumatoid arthritis with rheumatoid factor of right hand: Secondary | ICD-10-CM | POA: Diagnosis not present

## 2014-12-11 DIAGNOSIS — M25641 Stiffness of right hand, not elsewhere classified: Secondary | ICD-10-CM | POA: Diagnosis not present

## 2014-12-15 DIAGNOSIS — Z5189 Encounter for other specified aftercare: Secondary | ICD-10-CM | POA: Diagnosis not present

## 2014-12-15 DIAGNOSIS — M05841 Other rheumatoid arthritis with rheumatoid factor of right hand: Secondary | ICD-10-CM | POA: Diagnosis not present

## 2014-12-15 DIAGNOSIS — M1990 Unspecified osteoarthritis, unspecified site: Secondary | ICD-10-CM | POA: Diagnosis not present

## 2014-12-15 DIAGNOSIS — M25641 Stiffness of right hand, not elsewhere classified: Secondary | ICD-10-CM | POA: Diagnosis not present

## 2014-12-16 DIAGNOSIS — Z79899 Other long term (current) drug therapy: Secondary | ICD-10-CM | POA: Diagnosis not present

## 2014-12-16 DIAGNOSIS — Z940 Kidney transplant status: Secondary | ICD-10-CM | POA: Diagnosis not present

## 2014-12-26 DIAGNOSIS — M1990 Unspecified osteoarthritis, unspecified site: Secondary | ICD-10-CM | POA: Diagnosis not present

## 2014-12-26 DIAGNOSIS — M25641 Stiffness of right hand, not elsewhere classified: Secondary | ICD-10-CM | POA: Diagnosis not present

## 2014-12-26 DIAGNOSIS — M05841 Other rheumatoid arthritis with rheumatoid factor of right hand: Secondary | ICD-10-CM | POA: Diagnosis not present

## 2015-01-02 DIAGNOSIS — M1990 Unspecified osteoarthritis, unspecified site: Secondary | ICD-10-CM | POA: Diagnosis not present

## 2015-01-02 DIAGNOSIS — M25641 Stiffness of right hand, not elsewhere classified: Secondary | ICD-10-CM | POA: Diagnosis not present

## 2015-01-02 DIAGNOSIS — M05841 Other rheumatoid arthritis with rheumatoid factor of right hand: Secondary | ICD-10-CM | POA: Diagnosis not present

## 2015-01-07 DIAGNOSIS — Z96691 Finger-joint replacement of right hand: Secondary | ICD-10-CM | POA: Diagnosis not present

## 2015-01-07 DIAGNOSIS — M05842 Other rheumatoid arthritis with rheumatoid factor of left hand: Secondary | ICD-10-CM | POA: Diagnosis not present

## 2015-01-07 DIAGNOSIS — Z09 Encounter for follow-up examination after completed treatment for conditions other than malignant neoplasm: Secondary | ICD-10-CM | POA: Diagnosis not present

## 2015-01-07 DIAGNOSIS — M05841 Other rheumatoid arthritis with rheumatoid factor of right hand: Secondary | ICD-10-CM | POA: Diagnosis not present

## 2015-01-07 DIAGNOSIS — Z981 Arthrodesis status: Secondary | ICD-10-CM | POA: Diagnosis not present

## 2015-01-07 DIAGNOSIS — Z4889 Encounter for other specified surgical aftercare: Secondary | ICD-10-CM | POA: Diagnosis not present

## 2015-01-09 DIAGNOSIS — M25641 Stiffness of right hand, not elsewhere classified: Secondary | ICD-10-CM | POA: Diagnosis not present

## 2015-01-09 DIAGNOSIS — M1990 Unspecified osteoarthritis, unspecified site: Secondary | ICD-10-CM | POA: Diagnosis not present

## 2015-01-09 DIAGNOSIS — M05841 Other rheumatoid arthritis with rheumatoid factor of right hand: Secondary | ICD-10-CM | POA: Diagnosis not present

## 2015-01-14 ENCOUNTER — Emergency Department (INDEPENDENT_AMBULATORY_CARE_PROVIDER_SITE_OTHER)
Admission: EM | Admit: 2015-01-14 | Discharge: 2015-01-14 | Disposition: A | Discharge status: Home / Self Care | Payer: Medicare Other | Source: Home / Self Care | Attending: Family Medicine | Admitting: Family Medicine

## 2015-01-14 ENCOUNTER — Encounter (HOSPITAL_COMMUNITY): Payer: Self-pay | Admitting: *Deleted

## 2015-01-14 DIAGNOSIS — J069 Acute upper respiratory infection, unspecified: Secondary | ICD-10-CM | POA: Diagnosis not present

## 2015-01-14 MED ORDER — IPRATROPIUM BROMIDE 0.06 % NA SOLN: 2.0000 | Freq: Four times a day (QID) | NASAL | Status: DC

## 2015-01-14 MED ORDER — DEXTROMETHORPHAN POLISTIREX 30 MG/5ML PO LQCR: 60.0000 mg | Freq: Two times a day (BID) | ORAL | Status: DC

## 2015-01-14 NOTE — Discharge Instructions (Signed)
Drink plenty of fluids as discussed, use medicine as prescribed, and mucinex or delsym for cough. Return or see your doctor if further problems °

## 2015-01-14 NOTE — ED Notes (Addendum)
States her boss brought her sick child to work and she got sick.  C/o sore throat and chest burning onset Sunday.  C/o cough prod. of yellow sputum.  No fever.  Voice is hoarse.  C/o feeling weak. Hx. Kidney transplant x2.

## 2015-01-14 NOTE — ED Provider Notes (Signed)
CSN: [id number]     Arrival date & time 01/14/15  1836 History   First MD Initiated Contact with Patient 01/14/15 1944     Chief Complaint  Patient presents with  . Sore Throat   (Consider location/radiation/quality/duration/timing/severity/associated sxs/prior Treatment) Patient is a 49 y.o. female presenting with pharyngitis. The history is provided by the patient.  Sore Throat This is a new problem. The current episode started more than 2 days ago. The problem has not changed since onset.Pertinent negatives include no chest pain, no abdominal pain and no shortness of breath.    Past Medical History  Diagnosis Date  . Hypertension   . Chronic kidney disease   . Rheumatoid arthritis of the hand   . Anemia     iron def,    Past Surgical History  Procedure Laterality Date  . Kiney transplant  2005, 2013     x 2  . Cesarean section  1992  . Hand surgery Right [id number]    silicone where the joints 3 MCP joints   Family History  Problem Relation Age of Onset  . Diabetes Maternal Grandmother   . Cancer Maternal Grandmother 5   History  Substance Use Topics  . Smoking status: Never Smoker   . Smokeless tobacco: Not on file  . Alcohol Use: 0.0 oz/week    0 Glasses of wine per week     Comment: occasional   OB History    No data available     Review of Systems  Constitutional: Negative for fever and chills.  HENT: Positive for congestion, postnasal drip and rhinorrhea.   Respiratory: Negative.  Negative for shortness of breath.   Cardiovascular: Negative.  Negative for chest pain.  Gastrointestinal: Negative for abdominal pain.    Allergies  Adhesive and Benazepril  Home Medications   Prior to Admission medications   Medication Sig Start Date End Date Taking? Authorizing Provider  amoxicillin (AMOXIL) 500 MG tablet Take 500 mg by mouth 4 (four) times daily as needed (for dental pain).    Yes Historical Provider, MD  aspirin 81 MG tablet Take 81 mg by mouth  daily.   Yes Historical Provider, MD  labetalol (NORMODYNE) 100 MG tablet Take 50 mg by mouth 2 (two) times daily.   Yes Historical Provider, MD  mycophenolate (MYFORTIC) 180 MG EC tablet Take 360 mg by mouth 2 (two) times daily.   Yes Historical Provider, MD  omeprazole (PRILOSEC) 20 MG capsule Take 20 mg by mouth daily.   Yes Historical Provider, MD  predniSONE (DELTASONE) 5 MG tablet Take 5 mg by mouth daily with breakfast.   Yes Historical Provider, MD  SODIUM BICARBONATE PO Take 2 tablets by mouth 2 (two) times daily.   Yes Historical Provider, MD  Sulfamethoxazole-Trimethoprim (BACTRIM PO) Take 1 tablet by mouth 3 (three) times a week. Take on Mon, Wed, Fri   Yes Historical Provider, MD  tacrolimus (PROGRAF) 1 MG capsule Take 1 mg by mouth daily. Take with 5mg  capsule for a total of 6 mg in the morning   Yes Historical Provider, MD  tacrolimus (PROGRAF) 5 MG capsule Take 5 mg by mouth 2 (two) times daily.   Yes Historical Provider, MD  Ciprofloxacin (CIPRO PO) Take 1 tablet by mouth daily.     Historical Provider, MD  dextromethorphan (DELSYM) 30 MG/5ML liquid Take 10 mLs (60 mg total) by mouth 2 (two) times daily. As needed for cough 01/14/15   Billy Fischer, MD  HYDROcodone-acetaminophen (NORCO/VICODIN) [phone]  MG per tablet Take 1 tablet by mouth every 4 (four) hours as needed for pain. 02/05/13   Mylinda Latina, MD  ipratropium (ATROVENT) 0.06 % nasal spray Place 2 sprays into both nostrils 4 (four) times daily. 01/14/15   Billy Fischer, MD  Multiple Vitamin (MULTIVITAMIN WITH MINERALS) TABS Take 1 tablet by mouth daily. Women vitamin    Historical Provider, MD  Multiple Vitamins-Minerals (HAIR/SKIN/NAILS) TABS Take 1 tablet by mouth daily.    Historical Provider, MD  OVER THE COUNTER MEDICATION Take 2 tablets by mouth daily. Natural Iron Tablet    Historical Provider, MD   BP 132/87 mmHg  Pulse 89  Temp(Src) 98.1 F (36.7 C) (Oral)  Resp 18  SpO2 100%  LMP 01/10/2015 Physical Exam   Constitutional: She is oriented to person, place, and time. She appears well-developed and well-nourished. No distress.  HENT:  Right Ear: External ear normal.  Left Ear: External ear normal.  Mouth/Throat: Oropharynx is clear and moist.  Eyes: Conjunctivae are normal. Pupils are equal, round, and reactive to light.  Neck: Normal range of motion. Neck supple.  Cardiovascular: Normal rate and normal heart sounds.   Pulmonary/Chest: Effort normal and breath sounds normal.  Lymphadenopathy:    She has no cervical adenopathy.  Neurological: She is alert and oriented to person, place, and time.  Skin: Skin is warm and dry.  Nursing note and vitals reviewed.   ED Course  Procedures (including critical care time) Labs Review Labs Reviewed - No data to display  Imaging Review No results found.   MDM   1. URI (upper respiratory infection)       Billy Fischer, MD 01/14/15 2010

## 2015-01-19 DIAGNOSIS — Z79899 Other long term (current) drug therapy: Secondary | ICD-10-CM | POA: Diagnosis not present

## 2015-01-20 DIAGNOSIS — M05841 Other rheumatoid arthritis with rheumatoid factor of right hand: Secondary | ICD-10-CM | POA: Diagnosis not present

## 2015-01-20 DIAGNOSIS — M25641 Stiffness of right hand, not elsewhere classified: Secondary | ICD-10-CM | POA: Diagnosis not present

## 2015-01-20 DIAGNOSIS — M1990 Unspecified osteoarthritis, unspecified site: Secondary | ICD-10-CM | POA: Diagnosis not present

## 2015-01-20 DIAGNOSIS — Z5189 Encounter for other specified aftercare: Secondary | ICD-10-CM | POA: Diagnosis not present

## 2015-01-28 DIAGNOSIS — M05841 Other rheumatoid arthritis with rheumatoid factor of right hand: Secondary | ICD-10-CM | POA: Diagnosis not present

## 2015-01-28 DIAGNOSIS — M25641 Stiffness of right hand, not elsewhere classified: Secondary | ICD-10-CM | POA: Diagnosis not present

## 2015-01-28 DIAGNOSIS — Z5189 Encounter for other specified aftercare: Secondary | ICD-10-CM | POA: Diagnosis not present

## 2015-01-28 DIAGNOSIS — M1990 Unspecified osteoarthritis, unspecified site: Secondary | ICD-10-CM | POA: Diagnosis not present

## 2015-01-29 DIAGNOSIS — Z7982 Long term (current) use of aspirin: Secondary | ICD-10-CM | POA: Diagnosis not present

## 2015-01-29 DIAGNOSIS — N186 End stage renal disease: Secondary | ICD-10-CM | POA: Diagnosis not present

## 2015-01-29 DIAGNOSIS — M069 Rheumatoid arthritis, unspecified: Secondary | ICD-10-CM | POA: Diagnosis not present

## 2015-01-29 DIAGNOSIS — E041 Nontoxic single thyroid nodule: Secondary | ICD-10-CM | POA: Diagnosis not present

## 2015-02-06 DIAGNOSIS — M05841 Other rheumatoid arthritis with rheumatoid factor of right hand: Secondary | ICD-10-CM | POA: Diagnosis not present

## 2015-02-06 DIAGNOSIS — M1990 Unspecified osteoarthritis, unspecified site: Secondary | ICD-10-CM | POA: Diagnosis not present

## 2015-02-06 DIAGNOSIS — Z5189 Encounter for other specified aftercare: Secondary | ICD-10-CM | POA: Diagnosis not present

## 2015-02-06 DIAGNOSIS — M25641 Stiffness of right hand, not elsewhere classified: Secondary | ICD-10-CM | POA: Diagnosis not present

## 2015-02-10 DIAGNOSIS — M25641 Stiffness of right hand, not elsewhere classified: Secondary | ICD-10-CM | POA: Diagnosis not present

## 2015-02-10 DIAGNOSIS — Z5189 Encounter for other specified aftercare: Secondary | ICD-10-CM | POA: Diagnosis not present

## 2015-02-10 DIAGNOSIS — M05841 Other rheumatoid arthritis with rheumatoid factor of right hand: Secondary | ICD-10-CM | POA: Diagnosis not present

## 2015-02-10 DIAGNOSIS — M1990 Unspecified osteoarthritis, unspecified site: Secondary | ICD-10-CM | POA: Diagnosis not present

## 2015-02-16 DIAGNOSIS — M25641 Stiffness of right hand, not elsewhere classified: Secondary | ICD-10-CM | POA: Diagnosis not present

## 2015-02-16 DIAGNOSIS — M05841 Other rheumatoid arthritis with rheumatoid factor of right hand: Secondary | ICD-10-CM | POA: Diagnosis not present

## 2015-02-16 DIAGNOSIS — Z5189 Encounter for other specified aftercare: Secondary | ICD-10-CM | POA: Diagnosis not present

## 2015-02-16 DIAGNOSIS — M1990 Unspecified osteoarthritis, unspecified site: Secondary | ICD-10-CM | POA: Diagnosis not present

## 2015-02-25 DIAGNOSIS — Z5189 Encounter for other specified aftercare: Secondary | ICD-10-CM | POA: Diagnosis not present

## 2015-02-25 DIAGNOSIS — M1990 Unspecified osteoarthritis, unspecified site: Secondary | ICD-10-CM | POA: Diagnosis not present

## 2015-02-25 DIAGNOSIS — M25641 Stiffness of right hand, not elsewhere classified: Secondary | ICD-10-CM | POA: Diagnosis not present

## 2015-02-25 DIAGNOSIS — M05841 Other rheumatoid arthritis with rheumatoid factor of right hand: Secondary | ICD-10-CM | POA: Diagnosis not present

## 2015-03-04 DIAGNOSIS — Z5189 Encounter for other specified aftercare: Secondary | ICD-10-CM | POA: Diagnosis not present

## 2015-03-04 DIAGNOSIS — M05841 Other rheumatoid arthritis with rheumatoid factor of right hand: Secondary | ICD-10-CM | POA: Diagnosis not present

## 2015-03-04 DIAGNOSIS — M1990 Unspecified osteoarthritis, unspecified site: Secondary | ICD-10-CM | POA: Diagnosis not present

## 2015-03-04 DIAGNOSIS — M25641 Stiffness of right hand, not elsewhere classified: Secondary | ICD-10-CM | POA: Diagnosis not present

## 2015-03-09 DIAGNOSIS — Z91048 Other nonmedicinal substance allergy status: Secondary | ICD-10-CM | POA: Diagnosis not present

## 2015-03-09 DIAGNOSIS — B349 Viral infection, unspecified: Secondary | ICD-10-CM | POA: Diagnosis not present

## 2015-03-09 DIAGNOSIS — Z940 Kidney transplant status: Secondary | ICD-10-CM | POA: Diagnosis not present

## 2015-03-09 DIAGNOSIS — Z7952 Long term (current) use of systemic steroids: Secondary | ICD-10-CM | POA: Diagnosis not present

## 2015-03-09 DIAGNOSIS — I129 Hypertensive chronic kidney disease with stage 1 through stage 4 chronic kidney disease, or unspecified chronic kidney disease: Secondary | ICD-10-CM | POA: Diagnosis not present

## 2015-03-09 DIAGNOSIS — Z792 Long term (current) use of antibiotics: Secondary | ICD-10-CM | POA: Diagnosis not present

## 2015-03-09 DIAGNOSIS — Z23 Encounter for immunization: Secondary | ICD-10-CM | POA: Diagnosis not present

## 2015-03-09 DIAGNOSIS — Z7982 Long term (current) use of aspirin: Secondary | ICD-10-CM | POA: Diagnosis not present

## 2015-03-09 DIAGNOSIS — N184 Chronic kidney disease, stage 4 (severe): Secondary | ICD-10-CM | POA: Diagnosis not present

## 2015-03-09 DIAGNOSIS — Z888 Allergy status to other drugs, medicaments and biological substances status: Secondary | ICD-10-CM | POA: Diagnosis not present

## 2015-03-09 DIAGNOSIS — D8989 Other specified disorders involving the immune mechanism, not elsewhere classified: Secondary | ICD-10-CM | POA: Diagnosis not present

## 2015-03-09 DIAGNOSIS — I10 Essential (primary) hypertension: Secondary | ICD-10-CM | POA: Diagnosis not present

## 2015-03-11 DIAGNOSIS — M25641 Stiffness of right hand, not elsewhere classified: Secondary | ICD-10-CM | POA: Diagnosis not present

## 2015-03-11 DIAGNOSIS — M1990 Unspecified osteoarthritis, unspecified site: Secondary | ICD-10-CM | POA: Diagnosis not present

## 2015-03-11 DIAGNOSIS — Z5189 Encounter for other specified aftercare: Secondary | ICD-10-CM | POA: Diagnosis not present

## 2015-03-11 DIAGNOSIS — M05841 Other rheumatoid arthritis with rheumatoid factor of right hand: Secondary | ICD-10-CM | POA: Diagnosis not present

## 2015-03-13 DIAGNOSIS — B353 Tinea pedis: Secondary | ICD-10-CM | POA: Diagnosis not present

## 2015-03-25 DIAGNOSIS — M05841 Other rheumatoid arthritis with rheumatoid factor of right hand: Secondary | ICD-10-CM | POA: Diagnosis not present

## 2015-03-25 DIAGNOSIS — M05842 Other rheumatoid arthritis with rheumatoid factor of left hand: Secondary | ICD-10-CM | POA: Diagnosis not present

## 2015-06-19 DIAGNOSIS — D631 Anemia in chronic kidney disease: Secondary | ICD-10-CM | POA: Diagnosis not present

## 2015-06-19 DIAGNOSIS — I120 Hypertensive chronic kidney disease with stage 5 chronic kidney disease or end stage renal disease: Secondary | ICD-10-CM | POA: Diagnosis not present

## 2015-06-19 DIAGNOSIS — N186 End stage renal disease: Secondary | ICD-10-CM | POA: Diagnosis not present

## 2015-06-19 DIAGNOSIS — Z4822 Encounter for aftercare following kidney transplant: Secondary | ICD-10-CM | POA: Diagnosis not present

## 2015-06-19 DIAGNOSIS — Z79899 Other long term (current) drug therapy: Secondary | ICD-10-CM | POA: Diagnosis not present

## 2015-06-19 DIAGNOSIS — D899 Disorder involving the immune mechanism, unspecified: Secondary | ICD-10-CM | POA: Diagnosis not present

## 2015-06-19 DIAGNOSIS — Z940 Kidney transplant status: Secondary | ICD-10-CM | POA: Diagnosis not present

## 2015-06-19 DIAGNOSIS — N184 Chronic kidney disease, stage 4 (severe): Secondary | ICD-10-CM | POA: Diagnosis not present

## 2015-06-26 ENCOUNTER — Ambulatory Visit (INDEPENDENT_AMBULATORY_CARE_PROVIDER_SITE_OTHER): Payer: Medicare Other | Admitting: Podiatry

## 2015-06-26 ENCOUNTER — Ambulatory Visit (INDEPENDENT_AMBULATORY_CARE_PROVIDER_SITE_OTHER): Payer: Medicare Other

## 2015-06-26 ENCOUNTER — Encounter: Payer: Self-pay | Admitting: Podiatry

## 2015-06-26 VITALS — BP 128/86 | HR 91 | Resp 16 | Ht 63.0 in | Wt 130.0 lb

## 2015-06-26 DIAGNOSIS — M2042 Other hammer toe(s) (acquired), left foot: Secondary | ICD-10-CM

## 2015-06-26 DIAGNOSIS — M7752 Other enthesopathy of left foot: Secondary | ICD-10-CM | POA: Diagnosis not present

## 2015-06-26 DIAGNOSIS — M79672 Pain in left foot: Secondary | ICD-10-CM

## 2015-06-26 DIAGNOSIS — M2012 Hallux valgus (acquired), left foot: Secondary | ICD-10-CM

## 2015-06-26 DIAGNOSIS — Q828 Other specified congenital malformations of skin: Secondary | ICD-10-CM | POA: Diagnosis not present

## 2015-06-26 DIAGNOSIS — M778 Other enthesopathies, not elsewhere classified: Secondary | ICD-10-CM

## 2015-06-26 DIAGNOSIS — M779 Enthesopathy, unspecified: Secondary | ICD-10-CM

## 2015-06-26 NOTE — Progress Notes (Signed)
   Subjective:    Patient ID: Kathleen [name], female    DOB: 11-Sep-1966, 49 y.o.   MRN: [id number]  HPI Comments: "I have a place between my toes"  Patient c/o tender 2nd toe left, interdigital, for about 3 weeks. The area is callused and discolored. Rubs against the big toe. Her mom has tried to razor it off and put the medicated corn pad over it-no help.  She has had a 2 kidney transplants 2009 and 2011.     Review of Systems  Musculoskeletal: Positive for arthralgias.  Skin:       Change in nails  All other systems reviewed and are negative.      Objective:   Physical Exam: I have reviewed her past medical history medications allergies surgery social history chief complaint statement. Pulses are strongly palpable bilateral. Neurologic sensorium is intact versus once the monofilament. Deep tendon reflexes are intact. Muscle strength +5 over 5 dorsiflexion plantar flexors and inverters everters all intrinsic musculature is intact. Orthopedic evaluation demonstrates hallux valgus deformity with mild hammertoe deformities bilateral. Lateral deviation of the hallux left demonstrates adjusted position resulting in porokeratotic lesion and a bursitis beneath the DIPJ second digit left foot. No surrounding erythema and a saline as drainage or odor just an area of fluctuance with an overlying porokeratosis.        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  Plan: Discussed etiology pathology conservative versus surgical therapies. At this point I injected her sublesionally and deep nucleated the lesion. I injected the bursa with dexamethasone and local and aesthetic. I will follow-up with her on an as-needed basis.

## 2015-08-20 DIAGNOSIS — Z23 Encounter for immunization: Secondary | ICD-10-CM | POA: Diagnosis not present

## 2015-08-20 DIAGNOSIS — Z79899 Other long term (current) drug therapy: Secondary | ICD-10-CM | POA: Diagnosis not present

## 2015-08-20 DIAGNOSIS — I120 Hypertensive chronic kidney disease with stage 5 chronic kidney disease or end stage renal disease: Secondary | ICD-10-CM | POA: Diagnosis not present

## 2015-08-20 DIAGNOSIS — Z7952 Long term (current) use of systemic steroids: Secondary | ICD-10-CM | POA: Diagnosis not present

## 2015-08-20 DIAGNOSIS — Z940 Kidney transplant status: Secondary | ICD-10-CM | POA: Diagnosis not present

## 2015-08-20 DIAGNOSIS — Z888 Allergy status to other drugs, medicaments and biological substances status: Secondary | ICD-10-CM | POA: Diagnosis not present

## 2015-08-20 DIAGNOSIS — Z7982 Long term (current) use of aspirin: Secondary | ICD-10-CM | POA: Diagnosis not present

## 2015-08-20 DIAGNOSIS — K1120 Sialoadenitis, unspecified: Secondary | ICD-10-CM | POA: Diagnosis not present

## 2015-08-20 DIAGNOSIS — N186 End stage renal disease: Secondary | ICD-10-CM | POA: Diagnosis not present

## 2015-08-20 DIAGNOSIS — D899 Disorder involving the immune mechanism, unspecified: Secondary | ICD-10-CM | POA: Diagnosis not present

## 2015-08-20 DIAGNOSIS — Z4822 Encounter for aftercare following kidney transplant: Secondary | ICD-10-CM | POA: Diagnosis not present

## 2015-09-04 DIAGNOSIS — Z79899 Other long term (current) drug therapy: Secondary | ICD-10-CM | POA: Diagnosis not present

## 2015-09-04 DIAGNOSIS — Z940 Kidney transplant status: Secondary | ICD-10-CM | POA: Diagnosis not present

## 2015-09-24 DIAGNOSIS — N184 Chronic kidney disease, stage 4 (severe): Secondary | ICD-10-CM | POA: Diagnosis not present

## 2015-09-24 DIAGNOSIS — D8989 Other specified disorders involving the immune mechanism, not elsewhere classified: Secondary | ICD-10-CM | POA: Diagnosis not present

## 2015-10-02 DIAGNOSIS — D631 Anemia in chronic kidney disease: Secondary | ICD-10-CM | POA: Diagnosis not present

## 2015-10-02 DIAGNOSIS — D899 Disorder involving the immune mechanism, unspecified: Secondary | ICD-10-CM | POA: Diagnosis not present

## 2015-10-02 DIAGNOSIS — N184 Chronic kidney disease, stage 4 (severe): Secondary | ICD-10-CM | POA: Diagnosis not present

## 2015-10-02 DIAGNOSIS — Z940 Kidney transplant status: Secondary | ICD-10-CM | POA: Diagnosis not present

## 2015-10-02 DIAGNOSIS — I129 Hypertensive chronic kidney disease with stage 1 through stage 4 chronic kidney disease, or unspecified chronic kidney disease: Secondary | ICD-10-CM | POA: Diagnosis not present

## 2015-10-12 ENCOUNTER — Encounter (HOSPITAL_COMMUNITY): Payer: Self-pay | Admitting: Emergency Medicine

## 2015-10-12 ENCOUNTER — Emergency Department (INDEPENDENT_AMBULATORY_CARE_PROVIDER_SITE_OTHER)
Admission: EM | Admit: 2015-10-12 | Discharge: 2015-10-12 | Disposition: A | Discharge status: Home / Self Care | Payer: Medicare Other | Source: Home / Self Care | Attending: Family Medicine | Admitting: Family Medicine

## 2015-10-12 DIAGNOSIS — N63 Unspecified lump in breast: Secondary | ICD-10-CM

## 2015-10-12 DIAGNOSIS — N6320 Unspecified lump in the left breast, unspecified quadrant: Secondary | ICD-10-CM

## 2015-10-12 NOTE — Discharge Instructions (Signed)
Mammogram A mammogram is an X-ray of the breasts that is done to check for changes that are not normal. This test can screen for and find any changes that may suggest breast cancer. This test can also help to find other changes and variations in the breast. BEFORE THE PROCEDURE  Have this test done about 1-2 weeks after your period. This is usually when your breasts are the least tender.  If you are visiting a new doctor or clinic, send any past mammogram images to your new doctor's office.  Wash your breasts and under your arms the day of the test.  Do not use deodorants, perfumes, lotions, or powders on the day of the test.  Take off any jewelry from your neck.  Wear clothes that you can change into and out of easily. PROCEDURE  You will undress from the waist up. You will put on a gown.  You will stand in front of the X-ray machine.  Each breast will be placed between two plastic or glass plates. The plates will press down on your breast for a few seconds. Try to stay as relaxed as possible. This does not cause any harm to your breasts. Any discomfort you feel will be very brief.  X-rays will be taken from different angles of each breast. The procedure may vary among doctors and hospitals.  AFTER THE PROCEDURE  The mammogram will be looked at by a specialist (radiologist).  You may need to do certain parts of the test again. This depends on the quality of the images.  Ask when your test results will be ready. Make sure you get your test results.  You may go back to your normal activities.   This information is not intended to replace advice given to you by your health care provider. Make sure you discuss any questions you have with your health care provider.   Document Released: 02/03/2009 Document Revised: 10/27/2011 Document Reviewed: 01/16/2015 Elsevier Interactive Patient Education 2016 Elsevier Inc[employer]  Breast Cyst A breast cyst is a sac in the breast that is filled  with fluid. Breast cysts are common in women. Women can have one or many cysts. When the breasts contain many cysts, it is usually due to a noncancerous (benign) condition called fibrocystic change. These lumps form under the influence of female hormones (estrogen and progesterone). The lumps are most often located in the upper, outer portion of the breast. They are often more swollen, painful, and tender before your period starts. They usually disappear after menopause, unless you are on hormone therapy.  There are several types of cysts:  Macrocyst. This is a cyst that is about 2 in. (5.1 cm) in diameter.   Microcyst. This is a tiny cyst that you cannot feel but can be seen with a mammogram or an ultrasound.   Galactocele. This is a cyst containing milk that may develop if you suddenly stop breastfeeding.   Sebaceous cyst of the skin. This type of cyst is not in the breast tissue itself. Breast cysts do not increase your risk of breast cancer. However, they must be monitored closely because they can be cancerous.  CAUSES  It is not known exactly what causes a breast cyst to form. Possible causes include:  An overgrowth of milk glands and connective tissue in the breast can block the milk glands, causing them to fill with fluid.   Scar tissue in the breast from previous surgery may block the glands, causing a cyst.  RISK FACTORS  Estrogen may influence the development of a breast cyst.  SIGNS AND SYMPTOMS   Feeling a smooth, round, soft lump (like a grape) in the breast that is easily moveable.   Breast discomfort or pain.  Increase in size of the lump before your menstrual period and decrease in its size after your menstrual period.  DIAGNOSIS  A cyst can be felt during a physical exam by your health care provider. A breast X-ray exam (mammogram) and ultrasonography will be done to confirm the diagnosis. Fluid may be removed from the cyst with a needle (fine needle aspiration) to  make sure the cyst is not cancerous.  TREATMENT  Treatment may not be necessary. Your health care provider may monitor the cyst to see if it goes away on its own. If treatment is needed, it may include:  Hormone treatment.   Needle aspiration. There is a chance of the cyst coming back after aspiration.   Surgery to remove the whole cyst.  HOME CARE INSTRUCTIONS   Keep all follow-up appointments with your health care provider.  See your health care provider regularly:  Get a yearly exam by your health care provider.  Have a clinical breast exam by a health care provider every 1-3 years if you are 62-38 years of age. After age 65 years, you should have the exam every year.   Get mammogram tests as directed by your health care provider.   Understand the normal appearance and feel of your breasts and perform breast self-exams.   Only take over-the-counter or prescription medicines as directed by your health care provider.   Wear a supportive bra, especially when exercising.   Avoid caffeine.   Reduce your salt intake, especially before your menstrual period. Too much salt can cause fluid retention, breast swelling, and discomfort.  SEEK MEDICAL CARE IF:   You feel, or think you feel, a lump in your breast.   You notice that both breasts look or feel different than usual.   Your breast is still causing pain after your menstrual period is over.   You need medicine for breast pain and swelling that occurs with your menstrual period.  SEEK IMMEDIATE MEDICAL CARE IF:   You have severe pain, tenderness, redness, or warmth in your breast.   You have nipple discharge or bleeding.   Your breast lump becomes hard and painful.   You find new lumps or bumps that were not there before.   You feel lumps in your armpit (axilla).   You notice dimpling or wrinkling of the breast or nipple.   You have a fever.  MAKE SURE YOU:  Understand these  instructions.  Will watch your condition.  Will get help right away if you are not doing well or get worse.   This information is not intended to replace advice given to you by your health care provider. Make sure you discuss any questions you have with your health care provider.   Document Released: 11/07/2005 Document Revised: 07/10/2013 Document Reviewed: 06/06/2013 Elsevier Interactive Patient Education [employer].  Breast Scan Breast scan is procedure done to examine dense breast tissue, which is difficult in a normal mammogram. It is used in women with breast lesions from fibrocystic disease, fibroadenoma, and fat necrosis. It also is used to determine the course of treatment for breast cancer. LET [facility] CARE PROVIDER KNOW ABOUT:  Any allergies you have.  All medicines you are taking, including vitamins, herbs, eye drops, creams, and over-the-counter medicines.  Previous problems you or members of your family have had with the use of anesthetics.  Any blood disorders you have.  Previous surgeries you have had.  Medical conditions you have.  Pregnancy or the possibility that you may be pregnant. RISKS AND COMPLICATIONS Generally, this is a safe procedure. However, as with any procedure, complications can occur. Possible complications include:   Slight discomfort from injection of radioactive substance.  Allergic reaction to contrast or radioactive substance used in exam. BEFORE THE PROCEDURE No fasting or sedation is required. PROCEDURE   You will be asked to remove all jewelry and clothing from the waist up.  An IV tube will be inserted in your arm or hand opposite the side of the breast to be examined. If both breasts are being evaluated, the IV tube may be inserted into a vein in the foot.  You will be positioned face down on a table. The breast to be imaged will be placed through an opening in the table.  The radioactive agent will be injected into  the IV tube. You may experience a slight metallic taste after the injection.  Imaging will begin a few minutes after the injection. A scanner will be placed over the breast and will record the radiation given off.  You may also be asked to get into different positions during the scan.  When the scan is complete, the IV tube is removed. AFTER THE PROCEDURE  You will be asked to get up slowly from the scanner to avoid light-headedness from lying flat during the procedure.  Drink plenty of fluids to help flush the remaining radioactive agent from your body.   This information is not intended to replace advice given to you by your health care provider. Make sure you discuss any questions you have with your health care provider.   Document Released: 12/02/2004 Document Revised: 11/12/2013 Document Reviewed: 07/15/2013 Elsevier Interactive Patient Education [employer].

## 2015-10-12 NOTE — ED Provider Notes (Signed)
CSN: [id number]     Arrival date & time 10/12/15  1605 History   First MD Initiated Contact with Patient 10/12/15 1705     Chief Complaint  Patient presents with  . Breast Mass   (Consider location/radiation/quality/duration/timing/severity/associated sxs/prior Treatment) HPI Comments: 49 year old female presents to the urgent care with a newly  discovered a lump to the left breast.This was discovered approximately 2 days ago. It is located behind the left nipple. It is painless however there is mild tenderness. Is also mobile per patient's description. She denies systemic symptoms. Denies fatigue, appetite changes, weight changes, abnormal bleeding. She does "sense "that her left breast is swollen. Denies nipple discharge or bleeding. The right breast is stated to be normal.    Past Medical History  Diagnosis Date  . Hypertension   . Chronic kidney disease   . Rheumatoid arthritis of the hand   . Anemia     iron def,    Past Surgical History  Procedure Laterality Date  . Kiney transplant  2005, 2013     x 2  . Cesarean section  1992  . Hand surgery Right [id number]    silicone where the joints 3 MCP joints   Family History  Problem Relation Age of Onset  . Diabetes Maternal Grandmother   . Cancer Maternal Grandmother 64   Social History  Substance Use Topics  . Smoking status: Never Smoker   . Smokeless tobacco: None  . Alcohol Use: 0.0 oz/week    0 Glasses of wine per week     Comment: occasional   OB History    No data available     Review of Systems  Constitutional: Negative.   HENT: Negative.   Respiratory: Negative.   Cardiovascular: Negative.   Genitourinary: Negative.   Skin: Negative.   Neurological: Negative.     Allergies  Adhesive and Benazepril  Home Medications   Prior to Admission medications   Medication Sig Start Date End Date Taking? Authorizing Provider  aspirin 81 MG tablet Take 81 mg by mouth daily.    Historical Provider, MD    labetalol (NORMODYNE) 100 MG tablet Take 50 mg by mouth 2 (two) times daily.    Historical Provider, MD  Multiple Vitamin (MULTIVITAMIN WITH MINERALS) TABS Take 1 tablet by mouth daily. Women vitamin    Historical Provider, MD  Multiple Vitamins-Minerals (HAIR/SKIN/NAILS) TABS Take 1 tablet by mouth daily.    Historical Provider, MD  mycophenolate (MYFORTIC) 180 MG EC tablet Take 360 mg by mouth 2 (two) times daily.    Historical Provider, MD  omeprazole (PRILOSEC) 20 MG capsule Take 20 mg by mouth daily.    Historical Provider, MD  OVER THE COUNTER MEDICATION Take 2 tablets by mouth daily. Natural Iron Tablet    Historical Provider, MD  predniSONE (DELTASONE) 5 MG tablet Take 5 mg by mouth daily with breakfast.    Historical Provider, MD  SODIUM BICARBONATE PO Take 2 tablets by mouth 2 (two) times daily.    Historical Provider, MD  Sulfamethoxazole-Trimethoprim (BACTRIM PO) Take 1 tablet by mouth 3 (three) times a week. Take on Mon, Wed, Fri    Historical Provider, MD  tacrolimus (PROGRAF) 5 MG capsule Take 5 mg by mouth 2 (two) times daily.    Historical Provider, MD   Meds Ordered and Administered this Visit  Medications - No data to display  BP 144/83 mmHg  Pulse 89  Temp(Src) 97.8 F (36.6 C) (Oral)  Resp 18  SpO2 100%  LMP 01/10/2015 No data found.   Physical Exam  Constitutional: She appears well-developed and well-nourished. No distress.  Neck: Normal range of motion. Neck supple.  Cardiovascular: Normal rate.   Pulmonary/Chest: Effort normal. No respiratory distress. Right breast exhibits no inverted nipple, no mass, no nipple discharge, no skin change and no tenderness. Left breast exhibits mass. Left breast exhibits no inverted nipple. Breasts are asymmetrical.    Sharee Pimple, RNpresent during exam No swelling appreciated. L breast appears minimally larger than right, otherwise symmetric. There is a 5 cm x 4 cm ovoid solid, mobile and minimally tender mass behind the L nipple.  No nipple inversion, discharge , bleeding. No discoloration.  No palpable surrounding lymphadenopathy.   Neurological: She is alert. No cranial nerve deficit.  Skin: Skin is warm and dry.  Psychiatric: She has a normal mood and affect.  Nursing note and vitals reviewed.   ED Course  Procedures (including critical care time)  Labs Review Labs Reviewed - No data to display  Imaging Review No results found.   Visual Acuity Review  Right Eye Distance:   Left Eye Distance:   Bilateral Distance:    Right Eye Near:   Left Eye Near:    Bilateral Near:         MDM   1. Left breast mass    Scheduled future order for mammo Pt to call for appt tomorrow.    Janne Napoleon, NP 10/12/15 [phone]

## 2015-10-12 NOTE — ED Notes (Signed)
Pt here with left breat lump palpated on Saturday at home. States, lump is under L nipple and feels like a pulling pain Denies nipple d/c or inflammation Last mammogram 2009, normal

## 2015-10-13 ENCOUNTER — Other Ambulatory Visit (HOSPITAL_COMMUNITY): Payer: Self-pay | Admitting: Emergency Medicine

## 2015-10-13 DIAGNOSIS — N6320 Unspecified lump in the left breast, unspecified quadrant: Secondary | ICD-10-CM

## 2015-10-14 DIAGNOSIS — Z79899 Other long term (current) drug therapy: Secondary | ICD-10-CM | POA: Diagnosis not present

## 2015-10-14 DIAGNOSIS — Z940 Kidney transplant status: Secondary | ICD-10-CM | POA: Diagnosis not present

## 2015-10-21 ENCOUNTER — Ambulatory Visit
Admission: RE | Admit: 2015-10-21 | Discharge: 2015-10-21 | Disposition: A | Discharge status: Home / Self Care | Payer: Medicare Other | Source: Ambulatory Visit | Attending: Emergency Medicine | Admitting: Emergency Medicine

## 2015-10-21 DIAGNOSIS — N6320 Unspecified lump in the left breast, unspecified quadrant: Secondary | ICD-10-CM

## 2015-10-21 DIAGNOSIS — N6012 Diffuse cystic mastopathy of left breast: Secondary | ICD-10-CM | POA: Diagnosis not present

## 2015-10-21 DIAGNOSIS — R928 Other abnormal and inconclusive findings on diagnostic imaging of breast: Secondary | ICD-10-CM | POA: Diagnosis not present

## 2015-11-13 DIAGNOSIS — Z79899 Other long term (current) drug therapy: Secondary | ICD-10-CM | POA: Diagnosis not present

## 2015-11-18 ENCOUNTER — Emergency Department (INDEPENDENT_AMBULATORY_CARE_PROVIDER_SITE_OTHER)
Admission: EM | Admit: 2015-11-18 | Discharge: 2015-11-18 | Disposition: A | Discharge status: Home / Self Care | Payer: Medicare Other | Source: Home / Self Care | Attending: Family Medicine | Admitting: Family Medicine

## 2015-11-18 ENCOUNTER — Encounter (HOSPITAL_COMMUNITY): Payer: Self-pay | Admitting: Emergency Medicine

## 2015-11-18 DIAGNOSIS — B009 Herpesviral infection, unspecified: Secondary | ICD-10-CM | POA: Diagnosis not present

## 2015-11-18 NOTE — ED Provider Notes (Signed)
CSN: [id number]     Arrival date & time 11/18/15  1318 History   First MD Initiated Contact with Patient 11/18/15 1449     Chief Complaint  Patient presents with  . Rash   (Consider location/radiation/quality/duration/timing/severity/associated sxs/prior Treatment) Patient is a 49 y.o. female presenting with rash. The history is provided by the patient.  Rash Location:  Torso Torso rash location:  Lower back Quality: dryness and painful   Quality: not swelling and not weeping   Pain details:    Onset quality:  Gradual   Duration:  4 days Progression:  Partially resolved Chronicity:  New Context comment:  Lesions for rash for 4 days,  resolving.   Past Medical History  Diagnosis Date  . Hypertension   . Chronic kidney disease   . Rheumatoid arthritis of the hand   . Anemia     iron def,    Past Surgical History  Procedure Laterality Date  . Kiney transplant  2005, 2013     x 2  . Cesarean section  1992  . Hand surgery Right [id number]    silicone where the joints 3 MCP joints   Family History  Problem Relation Age of Onset  . Diabetes Maternal Grandmother   . Cancer Maternal Grandmother 4   Social History  Substance Use Topics  . Smoking status: Never Smoker   . Smokeless tobacco: None  . Alcohol Use: 0.0 oz/week    0 Glasses of wine per week     Comment: occasional   OB History    No data available     Review of Systems  Cardiovascular: Negative.   Gastrointestinal: Negative.   Genitourinary: Negative.   Skin: Positive for rash.  All other systems reviewed and are negative.   Allergies  Adhesive and Benazepril  Home Medications   Prior to Admission medications   Medication Sig Start Date End Date Taking? Authorizing Provider  aspirin 81 MG tablet Take 81 mg by mouth daily.    Historical Provider, MD  labetalol (NORMODYNE) 100 MG tablet Take 50 mg by mouth 2 (two) times daily.    Historical Provider, MD  Multiple Vitamin (MULTIVITAMIN WITH  MINERALS) TABS Take 1 tablet by mouth daily. Women vitamin    Historical Provider, MD  Multiple Vitamins-Minerals (HAIR/SKIN/NAILS) TABS Take 1 tablet by mouth daily.    Historical Provider, MD  mycophenolate (MYFORTIC) 180 MG EC tablet Take 360 mg by mouth 2 (two) times daily.    Historical Provider, MD  omeprazole (PRILOSEC) 20 MG capsule Take 20 mg by mouth daily.    Historical Provider, MD  OVER THE COUNTER MEDICATION Take 2 tablets by mouth daily. Natural Iron Tablet    Historical Provider, MD  predniSONE (DELTASONE) 5 MG tablet Take 5 mg by mouth daily with breakfast.    Historical Provider, MD  SODIUM BICARBONATE PO Take 2 tablets by mouth 2 (two) times daily.    Historical Provider, MD  Sulfamethoxazole-Trimethoprim (BACTRIM PO) Take 1 tablet by mouth 3 (three) times a week. Take on Mon, Wed, Fri    Historical Provider, MD  tacrolimus (PROGRAF) 5 MG capsule Take 5 mg by mouth 2 (two) times daily.    Historical Provider, MD   Meds Ordered and Administered this Visit  Medications - No data to display  BP 164/98 mmHg  Pulse 87  Temp(Src) 97.6 F (36.4 C) (Oral)  Resp 18  SpO2 100%  LMP 01/10/2015 No data found.   Physical Exam  Constitutional: She appears  well-developed and well-nourished.  Abdominal: Soft. Bowel sounds are normal.  Skin: Skin is warm and dry. Rash noted. Rash is vesicular.     Nursing note and vitals reviewed.   ED Course  Procedures (including critical care time)  Labs Review Labs Reviewed - No data to display  Imaging Review No results found.   Visual Acuity Review  Right Eye Distance:   Left Eye Distance:   Bilateral Distance:    Right Eye Near:   Left Eye Near:    Bilateral Near:         MDM   1. Herpes simplex infection        Billy Fischer, MD 11/18/15 1505

## 2015-11-18 NOTE — Discharge Instructions (Signed)
See your doctor as needed

## 2015-11-18 NOTE — ED Notes (Signed)
Rash to lower back, arm and left leg

## 2015-11-19 NOTE — ED Notes (Signed)
Patient called with lengthy conversation.  Patient called wanting diagnosis to be changed, she has been reading articles about diagnosis.  Stressed the quality of articles may not be legitimate, reliable articles.  Explained the umbrella of diagnosis and virus.  Patient repeatedly wanted diagnosis changed.  Encouraged patient to review "my chart".  Offered reevaluation by medical staff.  Denies rash changed.

## 2015-11-28 ENCOUNTER — Inpatient Hospital Stay (HOSPITAL_COMMUNITY)
Admission: EM | Admit: 2015-11-28 | Discharge: 2015-11-30 | DRG: 866 | Disposition: A | Discharge status: Home / Self Care | Payer: Medicare Other | Attending: Internal Medicine | Admitting: Internal Medicine

## 2015-11-28 ENCOUNTER — Encounter (HOSPITAL_COMMUNITY): Payer: Self-pay | Admitting: Emergency Medicine

## 2015-11-28 DIAGNOSIS — I10 Essential (primary) hypertension: Secondary | ICD-10-CM | POA: Diagnosis not present

## 2015-11-28 DIAGNOSIS — M069 Rheumatoid arthritis, unspecified: Secondary | ICD-10-CM | POA: Diagnosis present

## 2015-11-28 DIAGNOSIS — B029 Zoster without complications: Secondary | ICD-10-CM | POA: Diagnosis not present

## 2015-11-28 DIAGNOSIS — Z7982 Long term (current) use of aspirin: Secondary | ICD-10-CM | POA: Diagnosis not present

## 2015-11-28 DIAGNOSIS — Z91048 Other nonmedicinal substance allergy status: Secondary | ICD-10-CM

## 2015-11-28 DIAGNOSIS — B019 Varicella without complication: Secondary | ICD-10-CM | POA: Diagnosis present

## 2015-11-28 DIAGNOSIS — Z940 Kidney transplant status: Secondary | ICD-10-CM | POA: Diagnosis not present

## 2015-11-28 DIAGNOSIS — I129 Hypertensive chronic kidney disease with stage 1 through stage 4 chronic kidney disease, or unspecified chronic kidney disease: Secondary | ICD-10-CM | POA: Diagnosis present

## 2015-11-28 DIAGNOSIS — D631 Anemia in chronic kidney disease: Secondary | ICD-10-CM | POA: Diagnosis present

## 2015-11-28 DIAGNOSIS — N189 Chronic kidney disease, unspecified: Secondary | ICD-10-CM | POA: Diagnosis present

## 2015-11-28 DIAGNOSIS — Z888 Allergy status to other drugs, medicaments and biological substances status: Secondary | ICD-10-CM | POA: Diagnosis not present

## 2015-11-28 DIAGNOSIS — G510 Bell's palsy: Secondary | ICD-10-CM | POA: Insufficient documentation

## 2015-11-28 DIAGNOSIS — E875 Hyperkalemia: Secondary | ICD-10-CM | POA: Diagnosis not present

## 2015-11-28 DIAGNOSIS — Z79899 Other long term (current) drug therapy: Secondary | ICD-10-CM

## 2015-11-28 DIAGNOSIS — Z7952 Long term (current) use of systemic steroids: Secondary | ICD-10-CM | POA: Diagnosis not present

## 2015-11-28 DIAGNOSIS — B027 Disseminated zoster: Secondary | ICD-10-CM | POA: Diagnosis present

## 2015-11-28 LAB — COMPREHENSIVE METABOLIC PANEL
ALBUMIN: 3.7 g/dL (ref 3.5–5.0)
ALK PHOS: 53 U/L (ref 38–126)
ALT: 9 U/L — ABNORMAL LOW (ref 14–54)
ANION GAP: 8 (ref 5–15)
AST: 14 U/L — AB (ref 15–41)
BILIRUBIN TOTAL: 0.4 mg/dL (ref 0.3–1.2)
BUN: 32 mg/dL — AB (ref 6–20)
CALCIUM: 8.9 mg/dL (ref 8.9–10.3)
CO2: 21 mmol/L — ABNORMAL LOW (ref 22–32)
Chloride: 111 mmol/L (ref 101–111)
Creatinine, Ser: 4.23 mg/dL — ABNORMAL HIGH (ref 0.44–1.00)
GFR calc Af Amer: 13 mL/min — ABNORMAL LOW (ref >60)
GFR, EST NON AFRICAN AMERICAN: 11 mL/min — AB (ref >60)
GLUCOSE: 90 mg/dL (ref 65–99)
POTASSIUM: 5.3 mmol/L — AB (ref 3.5–5.1)
Sodium: 140 mmol/L (ref 135–145)
TOTAL PROTEIN: 6 g/dL — AB (ref 6.5–8.1)

## 2015-11-28 LAB — CBC WITH DIFFERENTIAL/PLATELET
Basophils Absolute: 0 10*3/uL (ref 0.0–0.1)
Basophils Relative: 0 %
Eosinophils Absolute: 0.1 10*3/uL (ref 0.0–0.7)
Eosinophils Relative: 3 %
HEMATOCRIT: 22.5 % — AB (ref 36.0–46.0)
HEMOGLOBIN: 7 g/dL — AB (ref 12.0–15.0)
LYMPHS ABS: 0.5 10*3/uL — AB (ref 0.7–4.0)
Lymphocytes Relative: 13 %
MCH: 26.8 pg (ref 26.0–34.0)
MCHC: 31.1 g/dL (ref 30.0–36.0)
MCV: 86.2 fL (ref 78.0–100.0)
MONOS PCT: 7 %
Monocytes Absolute: 0.3 10*3/uL (ref 0.1–1.0)
NEUTROS ABS: 3.3 10*3/uL (ref 1.7–7.7)
NEUTROS PCT: 77 %
Platelets: 160 10*3/uL (ref 150–400)
RBC: 2.61 MIL/uL — ABNORMAL LOW (ref 3.87–5.11)
RDW: 14.4 % (ref 11.5–15.5)
WBC: 4.3 10*3/uL (ref 4.0–10.5)

## 2015-11-28 MED ORDER — ARTIFICIAL TEARS OP OINT
TOPICAL_OINTMENT | Freq: Three times a day (TID) | OPHTHALMIC | Status: DC
  Administered 2015-11-28 – 2015-11-30 (×5): via OPHTHALMIC
  Filled 2015-11-28 (×2): qty 3.5

## 2015-11-28 MED ORDER — LABETALOL HCL 100 MG PO TABS
100.0000 mg | ORAL_TABLET | Freq: Two times a day (BID) | ORAL | Status: DC
  Administered 2015-11-28 – 2015-11-30 (×4): 100 mg via ORAL
  Filled 2015-11-28 (×4): qty 1

## 2015-11-28 MED ORDER — SODIUM CHLORIDE 0.9 % IJ SOLN
3.0000 mL | Freq: Two times a day (BID) | INTRAMUSCULAR | Status: DC
  Administered 2015-11-29 (×2): 3 mL via INTRAVENOUS

## 2015-11-28 MED ORDER — VALACYCLOVIR HCL 500 MG PO TABS
1000.0000 mg | ORAL_TABLET | Freq: Three times a day (TID) | ORAL | Status: DC
  Filled 2015-11-28 (×2): qty 2

## 2015-11-28 MED ORDER — HEPARIN SODIUM (PORCINE) 5000 UNIT/ML IJ SOLN: 5000.0000 [IU] | Freq: Three times a day (TID) | INTRAMUSCULAR | Status: DC

## 2015-11-28 MED ORDER — PANTOPRAZOLE SODIUM 40 MG PO TBEC: 40.0000 mg | DELAYED_RELEASE_TABLET | Freq: Every day | ORAL | Status: DC

## 2015-11-28 MED ORDER — SODIUM BICARBONATE 650 MG PO TABS
650.0000 mg | ORAL_TABLET | Freq: Two times a day (BID) | ORAL | Status: DC
  Administered 2015-11-28 – 2015-11-30 (×4): 650 mg via ORAL
  Filled 2015-11-28 (×4): qty 1

## 2015-11-28 MED ORDER — MYCOPHENOLATE SODIUM 180 MG PO TBEC
360.0000 mg | DELAYED_RELEASE_TABLET | Freq: Two times a day (BID) | ORAL | Status: DC
Start: 2015-11-28 — End: 2015-11-30
  Administered 2015-11-28 – 2015-11-30 (×4): 360 mg via ORAL
  Filled 2015-11-28 (×5): qty 2

## 2015-11-28 MED ORDER — ADULT MULTIVITAMIN W/MINERALS CH
1.0000 | ORAL_TABLET | Freq: Every day | ORAL | Status: DC
Start: 2015-11-28 — End: 2015-11-30
  Administered 2015-11-29 – 2015-11-30 (×2): 1 via ORAL
  Filled 2015-11-28 (×2): qty 1

## 2015-11-28 MED ORDER — ASPIRIN EC 81 MG PO TBEC
81.0000 mg | DELAYED_RELEASE_TABLET | Freq: Every day | ORAL | Status: DC
  Administered 2015-11-29 – 2015-11-30 (×2): 81 mg via ORAL
  Filled 2015-11-28 (×2): qty 1

## 2015-11-28 MED ORDER — METHYLPREDNISOLONE SODIUM SUCC 125 MG IJ SOLR
125.0000 mg | Freq: Once | INTRAMUSCULAR | Status: AC
  Administered 2015-11-28: 125 mg via INTRAVENOUS
  Filled 2015-11-28: qty 2

## 2015-11-28 MED ORDER — ACETAMINOPHEN 500 MG PO TABS
1000.0000 mg | ORAL_TABLET | Freq: Four times a day (QID) | ORAL | Status: DC | PRN
  Administered 2015-11-29: 1000 mg via ORAL
  Filled 2015-11-28: qty 2

## 2015-11-28 MED ORDER — DEXTROSE 5 % IV SOLN
600.0000 mg | INTRAVENOUS | Status: DC
  Administered 2015-11-28 – 2015-11-29 (×2): 600 mg via INTRAVENOUS
  Filled 2015-11-28 (×2): qty 12

## 2015-11-28 MED ORDER — SULFAMETHOXAZOLE-TRIMETHOPRIM 400-80 MG PO TABS
1.0000 | ORAL_TABLET | Freq: Every day | ORAL | Status: DC
  Filled 2015-11-28: qty 1

## 2015-11-28 MED ORDER — ONDANSETRON HCL 4 MG/2ML IJ SOLN: 4.0000 mg | Freq: Four times a day (QID) | INTRAMUSCULAR | Status: DC | PRN

## 2015-11-28 MED ORDER — PREDNISONE 5 MG PO TABS
5.0000 mg | ORAL_TABLET | Freq: Every day | ORAL | Status: DC
  Administered 2015-11-29 – 2015-11-30 (×2): 5 mg via ORAL
  Filled 2015-11-28 (×2): qty 1

## 2015-11-28 MED ORDER — FLUORESCEIN SODIUM 1 MG OP STRP
1.0000 | ORAL_STRIP | Freq: Once | OPHTHALMIC | Status: AC
  Administered 2015-11-28: 1 via OPHTHALMIC
  Filled 2015-11-28: qty 1

## 2015-11-28 MED ORDER — TACROLIMUS 1 MG PO CAPS
5.0000 mg | ORAL_CAPSULE | Freq: Two times a day (BID) | ORAL | Status: DC
  Administered 2015-11-28 – 2015-11-30 (×4): 5 mg via ORAL
  Filled 2015-11-28 (×5): qty 5

## 2015-11-28 MED ORDER — HAIR/SKIN/NAILS PO TABS: 1.0000 | ORAL_TABLET | Freq: Every day | ORAL | Status: DC

## 2015-11-28 MED ORDER — ONDANSETRON HCL 4 MG PO TABS: 4.0000 mg | ORAL_TABLET | Freq: Four times a day (QID) | ORAL | Status: DC | PRN

## 2015-11-28 MED ORDER — SULFAMETHOXAZOLE-TRIMETHOPRIM 400-80 MG PO TABS
1.0000 | ORAL_TABLET | ORAL | Status: DC
  Administered 2015-11-30: 1 via ORAL
  Filled 2015-11-28: qty 1

## 2015-11-28 MED ORDER — PANTOPRAZOLE SODIUM 40 MG PO TBEC
40.0000 mg | DELAYED_RELEASE_TABLET | Freq: Every day | ORAL | Status: DC
  Administered 2015-11-29 – 2015-11-30 (×2): 40 mg via ORAL
  Filled 2015-11-28 (×2): qty 1

## 2015-11-28 MED ORDER — DEXTROSE 5 % IV SOLN: 5.0000 mg/kg | Freq: Three times a day (TID) | INTRAVENOUS | Status: DC

## 2015-11-28 NOTE — ED Notes (Signed)
Pt from home reports that she noticed R sided facial droop that started around 1630 yesterday. Pt reports to  EDP that she had a recent reoccurrence of shingles since being dx one year ago with the same. Pt denies N/V, SOB, CP, dizziness. Pt is strong in all extremities and has no signs of neuro deficits. Pt adds that she has lesions around her body that sting and burn the way that the shingles did. Pt is A&O and in NAD

## 2015-11-28 NOTE — ED Notes (Signed)
Hold on 20 minute timer. Patient needs to go to negative pressure room. 4E is switching patients around to accommodate. Was told by charge nurse there that this could take up to an hour. I will call back up there in 20 minutes.

## 2015-11-28 NOTE — ED Notes (Signed)
Per pt, states she felt her left side of face was numb when she woke up yesterday-thought it was related to what she cleaned her cell phone with-states she woke up this am with same symptoms and having right facial droop

## 2015-11-28 NOTE — Progress Notes (Addendum)
ANTIBIOTIC CONSULT NOTE - INITIAL  Pharmacy Consult for Acyclovir Indication: disseminated Herpes Zoster  Allergies  Allergen Reactions  . Adhesive [Tape] Other (See Comments)    Removes skin - use paper tape  . Benazepril Swelling    Lips    Patient Measurements:   Total body weight (stated): 130lb, 59 kg  Vital Signs: Temp: 98.2 F (36.8 C) (01/07 1536) Temp Source: Oral (01/07 1536) BP: 157/104 mmHg (01/07 1536) Pulse Rate: 84 (01/07 1536) Intake/Output from previous day:   Intake/Output from this shift:    Labs:  Recent Labs  11/28/15 1502  WBC 4.3  HGB 7.0*  PLT 160  CREATININE 4.23*   CrCl cannot be calculated (Unknown ideal weight.). No results for input(s): VANCOTROUGH, VANCOPEAK, VANCORANDOM, GENTTROUGH, GENTPEAK, GENTRANDOM, TOBRATROUGH, TOBRAPEAK, TOBRARND, AMIKACINPEAK, AMIKACINTROU, AMIKACIN in the last 72 hours.   Microbiology: No results found for this or any previous visit (from the past 720 hour(s)).  Medical History: Past Medical History  Diagnosis Date  . Hypertension   . Chronic kidney disease   . Rheumatoid arthritis of the hand   . Anemia     iron def,    Medications:  Scheduled:  . artificial tears   Left Eye 3 times per day   Anti-infectives    Start     Dose/Rate Route Frequency Ordered Stop   11/28/15 1700  acyclovir (ZOVIRAX) 600 mg in dextrose 5 % 100 mL IVPB     600 mg 112 mL/hr over 60 Minutes Intravenous Every 24 hours 11/28/15 1640     11/28/15 1615  acyclovir (ZOVIRAX) 5 mg/kg in dextrose 5 % 250 mL IVPB  Status:  Discontinued     5 mg/kg 250 mL/hr over 60 Minutes Intravenous 3 times per day 11/28/15 1603 11/28/15 1633   11/28/15 1445  valACYclovir (VALTREX) tablet 1,000 mg  Status:  Discontinued     1,000 mg Oral 3 times daily 11/28/15 1444 11/28/15 1636     Assessment: 57 yoF with renal transplant on Prednisone & Mycophenolate. Hx of recurrent Herpes Zoster.  Recent outbreak of shingles, with rash on lower back,  left arm and face.  Acyclovir IV ordered, Pharmacy to dose for renal dysfunction.  Goal of Therapy:  Appropriate antiviral dose/schedule for treatment, renal function  Plan:   Acyclovir 600mg  q24hr  Adjust dose/schedule for renal function changes  Minda Ditto PharmD Pager [phone] 11/28/2015, 5:03 PM

## 2015-11-28 NOTE — Progress Notes (Signed)
PHARMACIST - PHYSICIAN ORDER COMMUNICATION  CONCERNING: [employer] Medication Policy on Herbal Medications  DESCRIPTION:  This patient's order for: hair/skin/nails tablet has been noted.  This product(s) is classified as an "herbal" or natural product. Due to a lack of definitive safety studies or FDA approval, nonstandard manufacturing practices, plus the potential risk of unknown drug-drug interactions while on inpatient medications, the Pharmacy and Therapeutics Committee does not permit the use of "herbal" or natural products of this type within [facility].   ACTION TAKEN: The pharmacy department is unable to verify this order at this time and your patient has been informed of this safety policy. Please reevaluate patient's clinical condition at discharge and address if the herbal or natural product(s) should be resumed at that time.   Thanks, Garnet Sierras, PharmD 11/28/2015

## 2015-11-28 NOTE — ED Notes (Signed)
Patient transport has been delayed for 2 hours due to room not being clean in addition to some patient movement on the floor to accommodate a negative pressure room need. Room is now clean but staff needs facilities to check room for negative pressure. 4th floor call ED when room is ready.

## 2015-11-28 NOTE — ED Notes (Addendum)
Called 4E back to check on bed status. They asked to speak to my charge nurse. I referred them to [facility].

## 2015-11-28 NOTE — H&P (Signed)
[facility] History and Physical  Kathleen [name] [id number] DOB: [birth year] DOA: 11/28/2015  Referring physician: ED physician, Dr. Jolyn [name]  PCP: Pt has no PCP, only sees Dr. Trinna [name] at [place]:   HPI:  Pt is 50 yo female with renal transplant on long term prednisone and CellCept, recurring herpes zoster, last one on her back and left arm area, who presented to [facility] ED with main concern of left sided facial droop that she noted one day prior to this admission and was not getting better. She explains she was unable to close her eye and was having some yellowish drainage. She also noted that her lips were not symmetric and she had some difficulty with speaking. Pt denies chest pain or shortness of breath, no abd or urinary concerns. No other specific focal neurological concerns such upper or lower extremity weakness.   In ED, pt noted to be hemodynamically stable, VSS, blood work notable for Hg 7 (pt says she thinks she was told this is her baseline), cr 4.23 (pt says this is at her baseline), K 5.3. [facility] asked to admit pt for further evaluation. ED doctor consulted with ID Dr. Baxter [name] who recommended placing pt on Acyclovir IV and monitoring clinical response. Telemetry bed requested.   Assessment and Plan:  Principal Problem:   Disseminated zoster, left eye area - also noted on the r lumbar area and L arm area - per ID doctor, IV Acyclovir was started - will monitor clinical response  Active Problems:   Kidney replaced by transplant - follows with Dr. Trinna [name] at [facility] - will notify him of pt's admission - monitor kidney function closely as it could be trending up  - continue cellcept and prednisone    Accelerated hypertension - continue labetalol per home medical regimen     Hyperkalemia - repeat BMP now and again in AM - may need dose of kayexalate     Anemia of chronic kidney failure - no signs of active bleeding - will need to obtain records to  determine recent Hg baseline   SCD's for DVT prophylaxis   Radiological Exams on Admission: No results found.   Code Status: Full Family Communication: Pt at bedside Disposition Plan: Admit for further evaluation    Kathleen [name] [facility] [id number]  Review of Systems:  Constitutional:  Negative for diaphoresis.  HENT: Negative for hearing loss, ear pain, nosebleeds, congestion, sore throat, neck pain, tinnitus and ear discharge.   Eyes: Negative for blurred vision, double vision  Respiratory: Negative for cough, hemoptysis, sputum production, shortness of breath, wheezing and stridor.   Cardiovascular: Negative for chest pain, palpitations, orthopnea, claudication and leg swelling.  Gastrointestinal: Negative for nausea, vomiting and abdominal pain.  Genitourinary: Negative for dysuria, urgency, frequency, hematuria and flank pain.  Musculoskeletal: Negative for myalgias, back pain, joint pain and falls.  Skin: Negative for itching and rash.  Neurological: Negative for dizziness and weakness.  Endo/Heme/Allergies: Negative for environmental allergies and polydipsia. Does not bruise/bleed easily.  Psychiatric/Behavioral: Negative for suicidal ideas. The patient is not nervous/anxious.      Past Medical History  Diagnosis Date  . Hypertension   . Chronic kidney disease   . Rheumatoid arthritis of the hand   . Anemia     iron def,     Past Surgical History  Procedure Laterality Date  . Kiney transplant  2005, 2013     x 2  . Cesarean section  1992  . Hand surgery Right 11/27/2014  silicone where the joints 3 MCP joints    Social History:  reports that she has never smoked. She does not have any smokeless tobacco history on file. She reports that she drinks alcohol. She reports that she does not use illicit drugs.  Allergies  Allergen Reactions  . Adhesive [Tape] Other (See Comments)    Removes skin - use paper tape  . Benazepril Swelling    Lips     Family History   Problem Relation Age of Onset  . Diabetes Maternal Grandmother   . Cancer Maternal Grandmother 85    Prior to Admission medications   Medication Sig Start Date End Date Taking? Authorizing Provider  acetaminophen (TYLENOL) 500 MG tablet Take 1,000 mg by mouth every 6 (six) hours as needed for moderate pain.   Yes Historical Provider, MD  aspirin 81 MG tablet Take 81 mg by mouth daily.   Yes Historical Provider, MD  labetalol (NORMODYNE) 100 MG tablet Take 100 mg by mouth 2 (two) times daily.    Yes Historical Provider, MD  Multiple Vitamin (MULTIVITAMIN WITH MINERALS) TABS Take 1 tablet by mouth daily. Women vitamin   Yes Historical Provider, MD  Multiple Vitamins-Minerals (HAIR/SKIN/NAILS) TABS Take 1 tablet by mouth daily.   Yes Historical Provider, MD  mycophenolate (MYFORTIC) 180 MG EC tablet Take 360 mg by mouth 2 (two) times daily.   Yes Historical Provider, MD  omeprazole (PRILOSEC) 20 MG capsule Take 20 mg by mouth daily.   Yes Historical Provider, MD  predniSONE (DELTASONE) 5 MG tablet Take 5 mg by mouth daily with breakfast.   Yes Historical Provider, MD  SODIUM BICARBONATE PO Take 2 tablets by mouth 2 (two) times daily.   Yes Historical Provider, MD  sulfamethoxazole-trimethoprim (BACTRIM,SEPTRA) 400-80 MG tablet Take 1 tablet by mouth. 09/24/15  Yes Historical Provider, MD  tacrolimus (PROGRAF) 5 MG capsule Take 5 mg by mouth 2 (two) times daily.   Yes Historical Provider, MD    Physical Exam: Filed Vitals:   11/28/15 1536  BP: 157/104  Pulse: 84  Temp: 98.2 F (36.8 C)  TempSrc: Oral  Resp: 20  SpO2: 100%    Physical Exam  Constitutional: Appears well-developed and well-nourished. No distress.  HENT: Normocephalic. External right and left ear normal. Oropharynx is clear and moist.  Eyes: Patient unable to fully close his left eyelid. Unable to furrow/wrinkle forehead/brow on left side. Mild left facial droop.  Neck: Normal ROM. Neck supple. No JVD. No tracheal  deviation. No thyromegaly.  CVS: RRR, S1/S2 +, no murmurs, no gallops, no carotid bruit.  Pulmonary: Effort and breath sounds normal, no stridor, rhonchi, wheezes, rales.  Abdominal: Soft. BS +,  no distension, tenderness, rebound or guarding.  Musculoskeletal: Normal range of motion. No edema and no tenderness.  Lymphadenopathy: No lymphadenopathy noted, cervical, inguinal. Neuro: Alert. Normal reflexes, muscle tone coordination. No cranial nerve deficit. Skin: Rash (right side of nose, midline of the top of the forehead, few lesions on the left arm. Dried and crusted lesions along the lower back bilaterally.) noted. Rash is vesicular.  Psychiatric: Normal mood and affect. Behavior, judgment, thought content normal.   Labs on Admission:  Basic Metabolic Panel:  Recent Labs Lab 11/28/15 1502  NA 140  K 5.3*  CL 111  CO2 21*  GLUCOSE 90  BUN 32*  CREATININE 4.23*  CALCIUM 8.9   Liver Function Tests:  Recent Labs Lab 11/28/15 1502  AST 14*  ALT 9*  ALKPHOS 53  BILITOT 0.4  PROT 6.0*  ALBUMIN 3.7   CBC:  Recent Labs Lab 11/28/15 1502  WBC 4.3  NEUTROABS 3.3  HGB 7.0*  HCT 22.5*  MCV 86.2  PLT 160   EKG: pending   If 7PM-7AM, please contact night-coverage [email or web address] Password [email or web address] 11/28/2015, 4:16 PM

## 2015-11-28 NOTE — ED Notes (Signed)
Pt wanted to speak to provider before meds for clarity.

## 2015-11-28 NOTE — ED Provider Notes (Signed)
CSN: [id number]     Arrival date & time 11/28/15  1355 History   First MD Initiated Contact with Patient 11/28/15 1420     Chief Complaint  Patient presents with  . Facial Droop     (Consider location/radiation/quality/duration/timing/severity/associated sxs/prior Treatment) HPI Comments: 50 year old female with history of renal transplant currently on long-term prednisone and CellCept, recurring herpes zoster presents for facial droop. The patient reports that yesterday she noted that her eye was not closing correctly and that she was having drainage out of the eye.  She reports today she then started to notice that her left side of her mouth did not seem to be symmetric and she seemed to have a difficult time brushing her teeth on that side. She reports some mild numbness on that side of her face. Denies any focal weakness. She reports that she's been ambulating normally and was even helping to clear off snow from her neighbors car before presentation. Denies any headache. No changes in mental status. No fevers.  She reports that she recently had shingles on her lower back. She states that she has noted recently lesions on her left arm as well as on her face.   Past Medical History  Diagnosis Date  . Hypertension   . Chronic kidney disease   . Rheumatoid arthritis of the hand   . Anemia     iron def,    Past Surgical History  Procedure Laterality Date  . Kiney transplant  2005, 2013     x 2  . Cesarean section  1992  . Hand surgery Right [id number]    silicone where the joints 3 MCP joints   Family History  Problem Relation Age of Onset  . Diabetes Maternal Grandmother   . Cancer Maternal Grandmother 52   Social History  Substance Use Topics  . Smoking status: Never Smoker   . Smokeless tobacco: None  . Alcohol Use: 0.0 oz/week    0 Glasses of wine per week     Comment: occasional   OB History    No data available     Review of Systems  Constitutional: Negative for  fever, chills and fatigue.  HENT: Negative for congestion, rhinorrhea and sinus pressure.   Eyes: Positive for discharge. Negative for photophobia, pain, redness and visual disturbance.  Respiratory: Negative for cough, chest tightness, shortness of breath and wheezing.   Cardiovascular: Negative for chest pain and palpitations.  Gastrointestinal: Negative for nausea, vomiting, abdominal pain and diarrhea.  Genitourinary: Negative for dysuria, urgency, frequency and hematuria.  Musculoskeletal: Negative for myalgias and back pain.  Skin: Positive for rash.  Allergic/Immunologic: Positive for immunocompromised state.  Neurological: Positive for facial asymmetry. Negative for dizziness, seizures, syncope, speech difficulty, weakness, light-headedness and numbness.  Hematological: Does not bruise/bleed easily.      Allergies  Adhesive and Benazepril  Home Medications   Prior to Admission medications   Medication Sig Start Date End Date Taking? Authorizing Provider  acetaminophen (TYLENOL) 500 MG tablet Take 1,000 mg by mouth every 6 (six) hours as needed for moderate pain.   Yes Historical Provider, MD  aspirin 81 MG tablet Take 81 mg by mouth daily.   Yes Historical Provider, MD  labetalol (NORMODYNE) 100 MG tablet Take 100 mg by mouth 2 (two) times daily.    Yes Historical Provider, MD  Multiple Vitamin (MULTIVITAMIN WITH MINERALS) TABS Take 1 tablet by mouth daily. Women vitamin   Yes Historical Provider, MD  Multiple Vitamins-Minerals (HAIR/SKIN/NAILS) TABS Take  1 tablet by mouth daily.   Yes Historical Provider, MD  mycophenolate (MYFORTIC) 180 MG EC tablet Take 360 mg by mouth 2 (two) times daily.   Yes Historical Provider, MD  omeprazole (PRILOSEC) 20 MG capsule Take 20 mg by mouth daily.   Yes Historical Provider, MD  predniSONE (DELTASONE) 5 MG tablet Take 5 mg by mouth daily with breakfast.   Yes Historical Provider, MD  SODIUM BICARBONATE PO Take 2 tablets by mouth 2 (two)  times daily.   Yes Historical Provider, MD  sulfamethoxazole-trimethoprim (BACTRIM,SEPTRA) 400-80 MG tablet Take 1 tablet by mouth. 09/24/15  Yes Historical Provider, MD  tacrolimus (PROGRAF) 5 MG capsule Take 5 mg by mouth 2 (two) times daily.   Yes Historical Provider, MD   BP 157/104 mmHg  Pulse 84  Temp(Src) 98.2 F (36.8 C) (Oral)  Resp 20  SpO2 100%  LMP 01/10/2015 Physical Exam  Constitutional: She is oriented to person, place, and time. She appears well-developed and well-nourished. No distress.  HENT:  Head: Normocephalic and atraumatic.  Right Ear: External ear normal.  Left Ear: External ear normal.  Nose: Nose normal.  Mouth/Throat: Oropharynx is clear and moist. No oropharyngeal exudate.  Eyes: EOM are normal. Pupils are equal, round, and reactive to light.  Slit lamp exam:      The right eye shows no corneal abrasion, no corneal flare and no fluorescein uptake.       The left eye shows no corneal abrasion, no corneal flare and no fluorescein uptake.  No dendrites noted on examination  Neck: Normal range of motion. Neck supple.  Cardiovascular: Normal rate, regular rhythm, normal heart sounds and intact distal pulses.   No murmur heard. Pulmonary/Chest: Effort normal. No respiratory distress. She has no wheezes. She has no rales.  Abdominal: Soft. She exhibits no distension. There is no tenderness.  Musculoskeletal: Normal range of motion. She exhibits no edema or tenderness.  Neurological: She is alert and oriented to person, place, and time. She has normal strength. She is not disoriented. No sensory deficit. Gait normal.  Patient unable to fully close his left eyelid. Unable to furrow/wrinkle forehead/brow on left side.  Mild left facial droop.  Skin: Skin is warm and dry. Rash (right side of nose, midline of the top of the forehead, few lesions on the left arm.  Dried and crusted lesions along the lower back bilaterally.) noted. Rash is vesicular. She is not  diaphoretic.     Vitals reviewed.   ED Course  Procedures (including critical care time) Labs Review Labs Reviewed  CBC WITH DIFFERENTIAL/PLATELET - Abnormal; Notable for the following:    RBC 2.61 (*)    Hemoglobin 7.0 (*)    HCT 22.5 (*)    Lymphs Abs 0.5 (*)    All other components within normal limits  COMPREHENSIVE METABOLIC PANEL - Abnormal; Notable for the following:    Potassium 5.3 (*)    CO2 21 (*)    BUN 32 (*)    Creatinine, Ser 4.23 (*)    Total Protein 6.0 (*)    AST 14 (*)    ALT 9 (*)    GFR calc non Af Amer 11 (*)    GFR calc Af Amer 13 (*)    All other components within normal limits    Imaging Review No results found. I have personally reviewed and evaluated these images and lab results as part of my medical decision-making.   EKG Interpretation None  MDM  Patient was seen and evaluated in stable condition.  Discussed case with Dr. Baxter Flattery from ID who agreed in light of patient's immunocompromised state and multiple dermatomal involvement with plan for admission with higher dose steroids and IV Acyclovir.  She did recommend if possible getting an MRI of the brain with contrast.  Patient has elevated Cr at baseline and has no sign of encephalopathy so this was not ordered at this time.  Discussed with patient the plan of care.  She expressed understanding and agreement.  Discussed with Dr. Doyle Askew who agreed with admission and the patient was admitted under her care. Final diagnoses:  Herpes zoster  Bell's palsy    1. Disseminated herpes zoster  2. Bell's Palsy    Harvel Quale, MD 11/28/15 [phone]

## 2015-11-29 LAB — BASIC METABOLIC PANEL
ANION GAP: 10 (ref 5–15)
BUN: 41 mg/dL — ABNORMAL HIGH (ref 6–20)
CO2: 20 mmol/L — ABNORMAL LOW (ref 22–32)
Calcium: 8.8 mg/dL — ABNORMAL LOW (ref 8.9–10.3)
Chloride: 108 mmol/L (ref 101–111)
Creatinine, Ser: 4.39 mg/dL — ABNORMAL HIGH (ref 0.44–1.00)
GFR, EST AFRICAN AMERICAN: 13 mL/min — AB (ref >60)
GFR, EST NON AFRICAN AMERICAN: 11 mL/min — AB (ref >60)
Glucose, Bld: 111 mg/dL — ABNORMAL HIGH (ref 65–99)
POTASSIUM: 5.9 mmol/L — AB (ref 3.5–5.1)
SODIUM: 138 mmol/L (ref 135–145)

## 2015-11-29 LAB — CBC
HCT: 23.9 % — ABNORMAL LOW (ref 36.0–46.0)
Hemoglobin: 7.4 g/dL — ABNORMAL LOW (ref 12.0–15.0)
MCH: 27 pg (ref 26.0–34.0)
MCHC: 31 g/dL (ref 30.0–36.0)
MCV: 87.2 fL (ref 78.0–100.0)
PLATELETS: 200 10*3/uL (ref 150–400)
RBC: 2.74 MIL/uL — AB (ref 3.87–5.11)
RDW: 14.4 % (ref 11.5–15.5)
WBC: 5.5 10*3/uL (ref 4.0–10.5)

## 2015-11-29 MED ORDER — SODIUM POLYSTYRENE SULFONATE 15 GM/60ML PO SUSP
30.0000 g | Freq: Once | ORAL | Status: AC
  Administered 2015-11-29: 30 g via ORAL
  Filled 2015-11-29: qty 120

## 2015-11-29 NOTE — Progress Notes (Signed)
Patient ID: Kathleen [name], female   DOB: [birth year], 50 y.o.   MRN: [id number]  [facility] PROGRESS NOTE  Kathleen [name] [id number] DOB: [birth year] DOA: 11/28/2015 PCP: No PCP Per Patient   Brief narrative:    Pt is 50 yo female with renal transplant on long term prednisone and CellCept, recurring herpes zoster, last one on her back and left arm area, who presented to [facility] ED with main concern of left sided facial droop that she noted one day prior to this admission and was not getting better. She explains she was unable to close her eye and was having some yellowish drainage. She also noted that her lips were not symmetric and she had some difficulty with speaking. Pt denies chest pain or shortness of breath, no abd or urinary concerns. No other specific focal neurological concerns such upper or lower extremity weakness.   In ED, pt noted to be hemodynamically stable, VSS, blood work notable for Hg 7 (pt says she thinks she was told this is her baseline), cr 4.23 (pt says this is at her baseline), K 5.3. [facility] asked to admit pt for further evaluation. ED doctor consulted with ID Dr. Baxter Flattery who recommended placing pt on Acyclovir IV and monitoring clinical response. Telemetry bed requested.   Assessment/Plan:    Principal Problem:  Disseminated zoster, left eye area, bells palsy  - also noted on the L lumbar area and R arm area - per ID doctor, IV Acyclovir was started - improving, possible d/c in AM  Active Problems:  Kidney replaced by transplant - follows with Dr. Trinna Post at [facility] - will notify him of pt's admission - monitor kidney function closely as it could be trending up  - continue cellcept and prednisone   Accelerated hypertension - continue labetalol per home medical regimen    Hyperkalemia - give dose of kayexalate  - BMP in AM   Anemia of chronic kidney failure - no signs of active bleeding - asked for records to determine recent Hg baseline   SCD's for  DVT prophylaxis   Code Status: Full.  Family Communication:  plan of care discussed with the patient Disposition Plan: Home by 11/30/2015  IV access:  Peripheral IV  Procedures and diagnostic studies:    No results found.  Medical Consultants:  None   Other Consultants:  None  IAnti-Infectives:   Acyclovir 1/7 -->  Faye Ramsay, MD  [facility] Pager [phone]  If 7PM-7AM, please contact night-coverage [email or web address] Password [email or web address] 11/29/2015, 3:33 PM   LOS: 1 day   HPI/Subjective: No events overnight.   Objective: Filed Vitals:   11/28/15 2027 11/28/15 2309 11/29/15 0627 11/29/15 1248  BP: 162/92 144/85 140/82 142/79  Pulse: 88 87 79 89  Temp: 98.1 F (36.7 C) 98 F (36.7 C) 97.7 F (36.5 C) 97.9 F (36.6 C)  TempSrc: Oral Oral Oral Oral  Resp: 16 16 16 20   Height:   5\' 3"  (1.6 m)   Weight:   56.6 kg (124 lb 12.5 oz)   SpO2: 100% 100% 100% 100%    Intake/Output Summary (Last 24 hours) at 11/29/15 1533 Last data filed at 11/29/15 1251  Gross per 24 hour  Intake    352 ml  Output      4 ml  Net    348 ml    Exam:   General:  Pt is alert, follows commands appropriately, not in acute distress  Cardiovascular: Regular rate and rhythm, no rubs, no gallops  Respiratory: Clear to  auscultation bilaterally, no wheezing, no crackles, no rhonchi  Abdomen: Soft, non tender, non distended, bowel sounds present, no guarding  Extremities: No edema, pulses DP and PT palpable bilaterally  Neuro: still unable to close left eyelid  Data Reviewed: Basic Metabolic Panel:  Recent Labs Lab 11/28/15 1502 11/29/15 0533  NA 140 138  K 5.3* 5.9*  CL 111 108  CO2 21* 20*  GLUCOSE 90 111*  BUN 32* 41*  CREATININE 4.23* 4.39*  CALCIUM 8.9 8.8*   Liver Function Tests:  Recent Labs Lab 11/28/15 1502  AST 14*  ALT 9*  ALKPHOS 53  BILITOT 0.4  PROT 6.0*  ALBUMIN 3.7   No results for input(s): LIPASE, AMYLASE in the last 168 hours. No results for input(s):  AMMONIA in the last 168 hours. CBC:  Recent Labs Lab 11/28/15 1502 11/29/15 0533  WBC 4.3 5.5  NEUTROABS 3.3  --   HGB 7.0* 7.4*  HCT 22.5* 23.9*  MCV 86.2 87.2  PLT 160 200    Scheduled Meds: . acyclovir  600 mg Intravenous Q24H  . artificial tears   Left Eye 3 times per day  . aspirin EC  81 mg Oral Daily  . labetalol  100 mg Oral BID  . multivitamin with minerals  1 tablet Oral Daily  . mycophenolate  360 mg Oral BID  . pantoprazole  40 mg Oral Daily  . predniSONE  5 mg Oral Q breakfast  . sodium bicarbonate  650 mg Oral BID  . sodium chloride  3 mL Intravenous Q12H  . [START ON 11/30/2015] sulfamethoxazole-trimethoprim  1 tablet Oral Once per day on Mon Wed Fri  . tacrolimus  5 mg Oral BID   Continuous Infusions:

## 2015-11-29 NOTE — Progress Notes (Signed)
Pt states she is no longer on a renal diet since she had a kidney transplant. MD, Doyle Askew made aware of this and requested her to change diet order.  Kathleen [name] I

## 2015-11-30 DIAGNOSIS — B029 Zoster without complications: Secondary | ICD-10-CM | POA: Insufficient documentation

## 2015-11-30 DIAGNOSIS — G510 Bell's palsy: Secondary | ICD-10-CM | POA: Insufficient documentation

## 2015-11-30 LAB — CBC
HCT: 21.4 % — ABNORMAL LOW (ref 36.0–46.0)
HEMOGLOBIN: 6.7 g/dL — AB (ref 12.0–15.0)
MCH: 27.3 pg (ref 26.0–34.0)
MCHC: 31.3 g/dL (ref 30.0–36.0)
MCV: 87.3 fL (ref 78.0–100.0)
Platelets: 183 10*3/uL (ref 150–400)
RBC: 2.45 MIL/uL — ABNORMAL LOW (ref 3.87–5.11)
RDW: 14.5 % (ref 11.5–15.5)
WBC: 5.6 10*3/uL (ref 4.0–10.5)

## 2015-11-30 LAB — ABO/RH: ABO/RH(D): A POS

## 2015-11-30 LAB — RENAL FUNCTION PANEL
ALBUMIN: 3.5 g/dL (ref 3.5–5.0)
ANION GAP: 8 (ref 5–15)
BUN: 42 mg/dL — AB (ref 6–20)
CALCIUM: 8.4 mg/dL — AB (ref 8.9–10.3)
CO2: 23 mmol/L (ref 22–32)
Chloride: 107 mmol/L (ref 101–111)
Creatinine, Ser: 4.18 mg/dL — ABNORMAL HIGH (ref 0.44–1.00)
GFR calc Af Amer: 13 mL/min — ABNORMAL LOW (ref >60)
GFR, EST NON AFRICAN AMERICAN: 12 mL/min — AB (ref >60)
GLUCOSE: 88 mg/dL (ref 65–99)
PHOSPHORUS: 4.1 mg/dL (ref 2.5–4.6)
Potassium: 4.4 mmol/L (ref 3.5–5.1)
SODIUM: 138 mmol/L (ref 135–145)

## 2015-11-30 LAB — PREPARE RBC (CROSSMATCH)

## 2015-11-30 MED ORDER — ACYCLOVIR 400 MG PO TABS: 400.0000 mg | ORAL_TABLET | Freq: Three times a day (TID) | ORAL | Status: DC

## 2015-11-30 MED ORDER — DEXTROSE 5 % IV SOLN
550.0000 mg | INTRAVENOUS | Status: DC
  Filled 2015-11-30: qty 11

## 2015-11-30 MED ORDER — SODIUM CHLORIDE 0.9 % IV SOLN: Freq: Once | INTRAVENOUS | Status: DC

## 2015-11-30 NOTE — Discharge Instructions (Signed)
CRANIAL NERVES  Smell (I) The olfactory nerve (I) conveys the sense of smell. Damage to the olfactory nerve (I) can cause an inability to smell   Vision (II) The optic nerve (II) transmits visual information Damage to the optic nerve (II) affects specific aspects of vision that depend on the location of the lesion. A person may not be able to see objects on their left or right sides   Eye movement (III, IV, VI) Various deviations of the eyes due to abnormal function of the targets of the cranial nerves  The oculomotor nerve (III), trochlear nerve (IV) and abducens nerve (VI) coordinate eye movement.  Damage to nerves III, IV, or VI may affect the movement of the eyeball (globe). Both or one eye may be affected; in either case double vision (diplopia) will likely occur because the movements of the eyes are no longer synchronized. Nerves III, IV and VI are tested by observing how the eye follows an object in different directions.   Damage to the oculomotor nerve (III) can cause double vision (diplopia) and inability to coordinate the movements of both eyes (strabismus), also eyelid drooping (ptosis) and pupil dilation (mydriasis).    Damage to the trochlear nerve (IV) can also cause diplopia with the eye adducted and elevated. Damage to the abducens nerve (VI) can also result in diplopia.[12]   Trigeminal nerve (V) The trigeminal nerve (V) is composed of three distinct parts: The Ophthalmic (V1), the Maxillary (V2), and the Mandibular (V3) nerves. Combined, these nerves provide sensation to the skin of the face and also controls the muscles of mastication (chewing). Conditions affecting the trigeminal nerve (V) include trigeminal neuralgia,[1] cluster headache, and trigeminal zoster. Trigeminal neuralgia occurs later in life, from middle age onwards, most often after age 44, and is a condition typically associated with very strong pain distributed over the area innervated by the maxillary or  mandibular nerve divisions of the trigeminal nerve (V2 and V3).  Facial expression (VII) Lesions of the facial nerve (VII) may manifest as facial palsy. This is where a person is unable to move the muscles on one or both sides of their face. A very common and generally temporarly facial palsy is known as Bell's palsy.   Hearing and balance (VIII) The vestibulocochlear nerve (VIII) splits into the vestibular and cochlear nerve.   When damaged, the vestibular nerve may give rise to the sensation of spinning and dizziness. Function of the vestibular nerve may be tested by putting cold and warm water in the ears and watching eye movements  Oral sensation, taste, and salivation (IX) Deviating uvula due to cranial nerve IX lesion   Vagus nerve (X)  Major effects of damage to the vagus nerve may include a rise in blood pressure and heart rate.  Shoulder elevation and head-turning (XI) Winged scapula may occur due to lesion of the spinal accessory.   Tongue movement (XII) A damaged hypoglossal nerve will result in an inability to stick the tongue out straight.   Bell Palsy Bell palsy is a condition in which the muscles on one side of the face become paralyzed. This often causes one side of the face to droop. It is a common condition and most people recover completely. RISK FACTORS Risk factors for Bell palsy include:  Pregnancy.  Diabetes.  An infection by a virus, such as infections that cause cold sores. CAUSES  Bell palsy is caused by damage to or inflammation of a nerve in your face. It is unclear why this  happens, but an infection by a virus may lead to it. Most of the time the reason it happens is unknown. SIGNS AND SYMPTOMS  Symptoms can range from mild to severe and can take place over a number of hours. Symptoms may include:  Being unable to:  Raise one or both eyebrows.  Close one or both eyes.  Feel parts of your face (facial numbness).  Drooping of the eyelid and  corner of the mouth.  Weakness in the face.  Paralysis of half your face.  Loss of taste.  Sensitivity to loud noises.  Difficulty chewing.  Tearing up of the affected eye.  Dryness in the affected eye.  Drooling.  Pain behind one ear. DIAGNOSIS  Diagnosis of Bell palsy may include:  A medical history and physical exam.  An MRI.  A CT scan.  Electromyography (EMG). This is a test that checks how your nerves are working. TREATMENT  Treatment may include antiviral medicine to help shorten the length of the condition. Sometimes treatment is not needed and the symptoms go away on their own. HOME CARE INSTRUCTIONS   Take medicines only as directed by your health care provider.  Do facial massages and exercises as directed by your health care provider.  If your eye is affected:  Use moisturizing eye drops to prevent drying of your eye as directed by your health care provider.  Protect your eye as directed by your health care provider. SEEK MEDICAL CARE IF:  Your symptoms do not get better or get worse.  You are drooling.  Your eye is red, irritated, or hurts. SEEK IMMEDIATE MEDICAL CARE IF:   Another part of your body feels weak or numb.  You have difficulty swallowing.  You have a fever along with symptoms of Bell palsy.  You develop neck pain. MAKE SURE YOU:   Understand these instructions.  Will watch your condition.  Will get help right away if you are not doing well or get worse.   This information is not intended to replace advice given to you by your health care provider. Make sure you discuss any questions you have with your health care provider.   Document Released: 11/07/2005 Document Revised: 07/29/2015 Document Reviewed: 02/14/2014 Elsevier Interactive Patient Education 2016 [place], which is also known as herpes zoster, is an infection that causes a painful skin rash and fluid-filled blisters. Shingles is not  related to genital herpes, which is a sexually transmitted infection.   Shingles only develops in people who:  Have had chickenpox.  Have received the chickenpox vaccine. (This is rare.) CAUSES Shingles is caused by varicella-zoster virus (VZV). This is the same virus that causes chickenpox. After exposure to VZV, the virus stays in the body in an inactive (dormant) state. Shingles develops if the virus reactivates. This can happen many years after the initial exposure to VZV. It is not known what causes this virus to reactivate. RISK FACTORS People who have had chickenpox or received the chickenpox vaccine are at risk for shingles. Infection is more common in people who:  Are older than age 70.  Have a weakened defense (immune) system, such as those with HIV, AIDS, or cancer.  Are taking medicines that weaken the immune system, such as transplant medicines.  Are under great stress. SYMPTOMS Early symptoms of this condition include itching, tingling, and pain in an area on your skin. Pain may be described as burning, stabbing, or throbbing. A few days or weeks after  symptoms start, a painful red rash appears, usually on one side of the body in a bandlike or beltlike pattern. The rash eventually turns into fluid-filled blisters that break open, scab over, and dry up in about 2-3 weeks. At any time during the infection, you may also develop:  A fever.  Chills.  A headache.  An upset stomach. DIAGNOSIS This condition is diagnosed with a skin exam. Sometimes, skin or fluid samples are taken from the blisters before a diagnosis is made. These samples are examined under a microscope or sent to a lab for testing. TREATMENT There is no specific cure for this condition. Your health care provider will probably prescribe medicines to help you manage pain, recover more quickly, and avoid long-term problems. Medicines may include:  Antiviral drugs.  Anti-inflammatory drugs.  Pain  medicines. If the area involved is on your face, you may be referred to a specialist, such as an eye doctor (ophthalmologist) or an ear, nose, and throat (ENT) doctor to help you avoid eye problems, chronic pain, or disability. HOME CARE INSTRUCTIONS Medicines  Take medicines only as directed by your health care provider.  Apply an anti-itch or numbing cream to the affected area as directed by your health care provider. Blister and Rash Care  Take a cool bath or apply cool compresses to the area of the rash or blisters as directed by your health care provider. This may help with pain and itching.  Keep your rash covered with a loose bandage (dressing). Wear loose-fitting clothing to help ease the pain of material rubbing against the rash.  Keep your rash and blisters clean with mild soap and cool water or as directed by your health care provider.  Check your rash every day for signs of infection. These include redness, swelling, and pain that lasts or increases.  Do not pick your blisters.  Do not scratch your rash. General Instructions  Rest as directed by your health care provider.  Keep all follow-up visits as directed by your health care provider. This is important.  Until your blisters scab over, your infection can cause chickenpox in people who have never had it or been vaccinated against it. To prevent this from happening, avoid contact with other people, especially:  Babies.  Pregnant women.  Children who have eczema.  Elderly people who have transplants.  People who have chronic illnesses, such as leukemia or AIDS. SEEK MEDICAL CARE IF:  Your pain is not relieved with prescribed medicines.  Your pain does not get better after the rash heals.  Your rash looks infected. Signs of infection include redness, swelling, and pain that lasts or increases. SEEK IMMEDIATE MEDICAL CARE IF:  The rash is on your face or nose.  You have facial pain, pain around your eye  area, or loss of feeling on one side of your face.  You have ear pain or you have ringing in your ear.  You have loss of taste.  Your condition gets worse.   This information is not intended to replace advice given to you by your health care provider. Make sure you discuss any questions you have with your health care provider.   Document Released: 11/07/2005 Document Revised: 11/28/2014 Document Reviewed: 09/18/2014 Elsevier Interactive Patient Education [employer].

## 2015-11-30 NOTE — Progress Notes (Signed)
Pharmacy Antibiotic Follow-up Note  Kathleen [name] is a 50 y.o. year-old female admitted on 11/28/2015.  The patient is currently on day 3 of Acyclovir 600mg  IV q24h for disseminated Herpes Zoster.  Assessment/Plan: The dose of Acyclovir will be adjusted to 550mg  IV q24h based on renal function.    Temp (24hrs), Avg:98.1 F (36.7 C), Min:97.9 F (36.6 C), Max:98.4 F (36.9 C)   Recent Labs Lab 11/28/15 1502 11/29/15 0533 11/30/15 0515  WBC 4.3 5.5 5.6    Recent Labs Lab 11/28/15 1502 11/29/15 0533 11/30/15 0515  CREATININE 4.23* 4.39* 4.18*   Estimated Creatinine Clearance: 13.6 mL/min (by C-G formula based on Cr of 4.18).    Allergies  Allergen Reactions  . Adhesive [Tape] Other (See Comments)    Removes skin - use paper tape  . Benazepril Swelling    Lips     Antimicrobials this admission: PTA >> Bactrim (prophylaxis) >> 1/7 >> Acyclovir >>   Levels/dose changes this admission: 1/9 Reduce Acyclovir to 550mg  to reflect current weight dosing.  Microbiology results: None  Thank you for allowing pharmacy to be a part of this patient's care.  Gretta Arab PharmD, BCPS Pager [phone] 11/30/2015 10:00 AM

## 2015-11-30 NOTE — Clinical Documentation Improvement (Signed)
Internal Medicine  Can the diagnosis of CKD be further specified in progress notes & discharge summary?   CKD Stage I - GFR greater than or equal to 90  CKD Stage II - GFR 60-89  CKD Stage III - GFR 30-59  CKD Stage IV - GFR 15-29  CKD Stage V - GFR < 15  ESRD (End Stage Renal Disease)  Other condition  Unable to clinically determine   Supporting Information: : (risk factors, signs and symptoms, diagnostics, treatment) Per H&P: History of CKD Cr 4.23 (pt says this is at her baseline Active Problems:  Kidney replaced by transplant - follows with Dr. Trinna Post at [facility] - will notify him of pt's admission - monitor kidney function closely as it could be trending up  - continue cellcept and prednisone  Component     Latest Ref Rng 11/28/2015 11/29/2015 11/30/2015  BUN     6 - 20 mg/dL 32 (H) 41 (H) 42 (H)  Creatinine     0.44 - 1.00 mg/dL 4.23 (H) 4.39 (H) 4.18 (H)  Calcium     8.9 - 10.3 mg/dL     Phosphorus     2.5 - 4.6 mg/dL     Albumin     3.5 - 5.0 g/dL     EGFR (Non-African Amer.)     >60 mL/min     EGFR (African American)     >60 mL/min 13 (L) 13 (L) 13 (L)   Treatments Daily weights Monitoring I&O Renal panel monitored Transplant MD at [facility] to be notified No IV fluids noted  Please exercise your independent, professional judgment when responding. A specific answer is not anticipated or expected.   Thank You, [place] [phone]

## 2015-11-30 NOTE — Progress Notes (Signed)
NUTRITION NOTE  Consult for diet education received from RN; RN also made RD aware in huddle this AM that pt requested to talk with RD. Pt with hx of kidney transplant and is being followed at [facility] states that she is trying to maintain kidney function as long as possible by controlling her diet. She is concerned about low hemoglobin while consuming a low protein diet. Talked with pt about iron and advised her to talk with MD concerning daily iron needs.  Provided pt with handouts from the Academy of Nutrition and Dietetics; "High Iron Foods List" and "High Potassium Foods List" (K was 5.9 mmol/L before given Kayexalate and it returned WDL to 4.4 mmol/L). Reviewed foods on each list with pt and highlighted the foods that appeared on both lists. Talked with pt about making trade-offs throughout the day so that she can meet goals for iron and potassium.  Pt appreciative and states that she is hoping to follow a vegan, alkaline diet at home after d/c. She denies any other nutrition-related questions or concerns at this time. Please re-consult if further nutrition related needs arise.    Jarome Matin, RD, LDN Inpatient Clinical Dietitian Pager # [phone] After hours/weekend pager # [phone]

## 2015-11-30 NOTE — Progress Notes (Signed)
HBG this AM is 6.7.  K. Schorr on call and notified.  No change in pt condition

## 2015-11-30 NOTE — Discharge Summary (Signed)
Physician Discharge Summary  Kathleen [name] [id number] DOB: 1966-07-16 DOA: 11/28/2015  PCP: No PCP Per Patient  Admit date: 11/28/2015 Discharge date: 11/30/2015  Recommendations for Outpatient Follow-up:  1. Pt will need to follow up with PCP in 1-2 weeks post discharge 2. Please obtain BMP to evaluate electrolytes and kidney function 3. Please also check CBC to evaluate Hg and Hct levels 4. Pt transfused one unit of blood prior to discharge  5. Pt advised to complete therapy with Acyclovir   Discharge Diagnoses:  Principal Problem:   Disseminated zoster Active Problems:   Kidney replaced by transplant   Accelerated hypertension   Hyperkalemia   Anemia of chronic kidney failure   Varicella   Discharge Condition: Stable  Diet recommendation: Renal diet discussed in details     Brief narrative:    Pt is 50 yo female with renal transplant on long term prednisone and CellCept, recurring herpes zoster, last one on her back and left arm area, who presented to [facility] ED with main concern of left sided facial droop that she noted one day prior to this admission and was not getting better. She explains she was unable to close her eye and was having some yellowish drainage. She also noted that her lips were not symmetric and she had some difficulty with speaking. Pt denies chest pain or shortness of breath, no abd or urinary concerns. No other specific focal neurological concerns such upper or lower extremity weakness.   In ED, pt noted to be hemodynamically stable, VSS, blood work notable for Hg 7 (pt says she thinks she was told this is her baseline), cr 4.23 (pt says this is at her baseline), K 5.3. [facility] asked to admit pt for further evaluation. ED doctor consulted with ID Dr. Baxter Flattery who recommended placing pt on Acyclovir IV and monitoring clinical response. Telemetry bed requested.   Assessment/Plan:    Principal Problem:  Disseminated zoster, left eye area, bells palsy  - also  noted on the L lumbar area and R arm area - per ID doctor, IV Acyclovir was started and pt now improving, transitioned to oral Acyclovir on d/c   Active Problems:  Kidney replaced by transplant - follows with Dr. Trinna Post at [facility] - monitor kidney function closely  - continue cellcept and prednisone   Accelerated hypertension - continue labetalol per home medical regimen    Hyperkalemia - resolved    Anemia of chronic kidney failure - transfused prior to discharge   SCD's for DVT prophylaxis   Code Status: Full.  Family Communication: plan of care discussed with the patient Disposition Plan: Home by 11/30/2015  IV access:  Peripheral IV  Procedures and diagnostic studies:    Imaging Results    No results found.   IAnti-Infectives:   Acyclovir 1/7 -->      Discharge Exam: Filed Vitals:   11/29/15 2016 11/30/15 0540  BP: 144/88 154/81  Pulse: 93 83  Temp: 98.4 F (36.9 C) 98.1 F (36.7 C)  Resp: 18 18   Filed Vitals:   11/29/15 1248 11/29/15 1700 11/29/15 2016 11/30/15 0540  BP: 142/79 149/89 144/88 154/81  Pulse: 89  93 83  Temp: 97.9 F (36.6 C)  98.4 F (36.9 C) 98.1 F (36.7 C)  TempSrc: Oral  Oral Oral  Resp: 20  18 18   Height:      Weight:    54.7 kg (120 lb 9.5 oz)  SpO2: 100%  100% 100%    General: Pt is alert,  follows commands appropriately, not in acute distress Cardiovascular: Regular rate and rhythm, no rubs, no gallops Respiratory: Clear to auscultation bilaterally, no wheezing, no crackles, no rhonchi Abdominal: Soft, non tender, non distended, bowel sounds +, no guarding  Discharge Instructions  Discharge Instructions    Diet - low sodium heart healthy    Complete by:  As directed      Increase activity slowly    Complete by:  As directed             Medication List    TAKE these medications        acetaminophen 500 MG tablet  Commonly known as:  TYLENOL  Take 1,000 mg by mouth every 6 (six) hours as needed  for moderate pain.     acyclovir 400 MG tablet  Commonly known as:  ZOVIRAX  Take 1 tablet (400 mg total) by mouth 3 (three) times daily.     aspirin 81 MG tablet  Take 81 mg by mouth daily.     HAIR/SKIN/NAILS Tabs  Take 1 tablet by mouth daily.     labetalol 100 MG tablet  Commonly known as:  NORMODYNE  Take 100 mg by mouth 2 (two) times daily.     multivitamin with minerals Tabs tablet  Take 1 tablet by mouth daily. Women vitamin     mycophenolate 180 MG EC tablet  Commonly known as:  MYFORTIC  Take 360 mg by mouth 2 (two) times daily.     omeprazole 20 MG capsule  Commonly known as:  PRILOSEC  Take 20 mg by mouth daily.     predniSONE 5 MG tablet  Commonly known as:  DELTASONE  Take 5 mg by mouth daily with breakfast.     SODIUM BICARBONATE PO  Take 2 tablets by mouth 2 (two) times daily.     sulfamethoxazole-trimethoprim 400-80 MG tablet  Commonly known as:  BACTRIM,SEPTRA  Take 1 tablet by mouth 3 (three) times a week. Take 1 tablet every Monday, Wednesday,and Friday     tacrolimus 5 MG capsule  Commonly known as:  PROGRAF  Take 5 mg by mouth 2 (two) times daily.           Follow-up Information    Call Faye Ramsay, MD.   Specialty:  Internal Medicine   Why:  As needed call my cell phone [phone]   Contact information:   [address] [place] [address] [phone]        The results of significant diagnostics from this hospitalization (including imaging, microbiology, ancillary and laboratory) are listed below for reference.     Microbiology: No results found for this or any previous visit (from the past 240 hour(s)).   Labs: Basic Metabolic Panel:  Recent Labs Lab 11/28/15 1502 11/29/15 0533 11/30/15 0515  NA 140 138 138  K 5.3* 5.9* 4.4  CL 111 108 107  CO2 21* 20* 23  GLUCOSE 90 111* 88  BUN 32* 41* 42*  CREATININE 4.23* 4.39* 4.18*  CALCIUM 8.9 8.8* 8.4*  PHOS  --   --  4.1   Liver Function  Tests:  Recent Labs Lab 11/28/15 1502 11/30/15 0515  AST 14*  --   ALT 9*  --   ALKPHOS 53  --   BILITOT 0.4  --   PROT 6.0*  --   ALBUMIN 3.7 3.5   CBC:  Recent Labs Lab 11/28/15 1502 11/29/15 0533 11/30/15 0515  WBC 4.3 5.5 5.6  NEUTROABS 3.3  --   --  HGB 7.0* 7.4* 6.7*  HCT 22.5* 23.9* 21.4*  MCV 86.2 87.2 87.3  PLT 160 200 183   Cardiac Enzymes: No results for input(s): CKTOTAL, CKMB, CKMBINDEX, TROPONINI in the last 168 hours. BNP: BNP (last 3 results) No results for input(s): BNP in the last 8760 hours.  ProBNP (last 3 results) No results for input(s): PROBNP in the last 8760 hours.  CBG: No results for input(s): GLUCAP in the last 168 hours.   SIGNED: Time coordinating discharge: 30 minutes  Faye Ramsay, MD  [facility] 11/30/2015, 10:12 AM Pager [phone]  If 7PM-7AM, please contact night-coverage [email or web address] Password [email or web address]

## 2015-12-01 LAB — TYPE AND SCREEN
ABO/RH(D): A POS
Antibody Screen: NEGATIVE
Unit division: 0

## 2015-12-10 DIAGNOSIS — Z79899 Other long term (current) drug therapy: Secondary | ICD-10-CM | POA: Diagnosis not present

## 2015-12-10 DIAGNOSIS — Z940 Kidney transplant status: Secondary | ICD-10-CM | POA: Diagnosis not present

## 2015-12-11 DIAGNOSIS — G510 Bell's palsy: Secondary | ICD-10-CM | POA: Diagnosis not present

## 2015-12-11 DIAGNOSIS — I120 Hypertensive chronic kidney disease with stage 5 chronic kidney disease or end stage renal disease: Secondary | ICD-10-CM | POA: Diagnosis not present

## 2015-12-11 DIAGNOSIS — Z940 Kidney transplant status: Secondary | ICD-10-CM | POA: Diagnosis not present

## 2015-12-11 DIAGNOSIS — Z4822 Encounter for aftercare following kidney transplant: Secondary | ICD-10-CM | POA: Diagnosis not present

## 2015-12-11 DIAGNOSIS — Z7982 Long term (current) use of aspirin: Secondary | ICD-10-CM | POA: Diagnosis not present

## 2015-12-11 DIAGNOSIS — E041 Nontoxic single thyroid nodule: Secondary | ICD-10-CM | POA: Diagnosis not present

## 2015-12-11 DIAGNOSIS — Z79899 Other long term (current) drug therapy: Secondary | ICD-10-CM | POA: Diagnosis not present

## 2015-12-11 DIAGNOSIS — R221 Localized swelling, mass and lump, neck: Secondary | ICD-10-CM | POA: Diagnosis not present

## 2015-12-11 DIAGNOSIS — N186 End stage renal disease: Secondary | ICD-10-CM | POA: Diagnosis not present

## 2015-12-11 DIAGNOSIS — D8989 Other specified disorders involving the immune mechanism, not elsewhere classified: Secondary | ICD-10-CM | POA: Diagnosis not present

## 2015-12-11 DIAGNOSIS — D631 Anemia in chronic kidney disease: Secondary | ICD-10-CM | POA: Diagnosis not present

## 2015-12-11 DIAGNOSIS — N184 Chronic kidney disease, stage 4 (severe): Secondary | ICD-10-CM | POA: Diagnosis not present

## 2015-12-11 DIAGNOSIS — Z888 Allergy status to other drugs, medicaments and biological substances status: Secondary | ICD-10-CM | POA: Diagnosis not present

## 2015-12-16 DIAGNOSIS — R591 Generalized enlarged lymph nodes: Secondary | ICD-10-CM | POA: Diagnosis not present

## 2015-12-30 DIAGNOSIS — R591 Generalized enlarged lymph nodes: Secondary | ICD-10-CM | POA: Diagnosis not present

## 2015-12-30 DIAGNOSIS — R221 Localized swelling, mass and lump, neck: Secondary | ICD-10-CM | POA: Diagnosis not present

## 2016-01-04 DIAGNOSIS — Z940 Kidney transplant status: Secondary | ICD-10-CM | POA: Diagnosis not present

## 2016-01-04 DIAGNOSIS — Z79899 Other long term (current) drug therapy: Secondary | ICD-10-CM | POA: Diagnosis not present

## 2016-01-05 DIAGNOSIS — Z940 Kidney transplant status: Secondary | ICD-10-CM | POA: Diagnosis not present

## 2016-01-19 DIAGNOSIS — D631 Anemia in chronic kidney disease: Secondary | ICD-10-CM | POA: Diagnosis not present

## 2016-01-19 DIAGNOSIS — N189 Chronic kidney disease, unspecified: Secondary | ICD-10-CM | POA: Diagnosis not present

## 2016-01-19 DIAGNOSIS — Z940 Kidney transplant status: Secondary | ICD-10-CM | POA: Diagnosis not present

## 2016-01-20 DIAGNOSIS — Z79899 Other long term (current) drug therapy: Secondary | ICD-10-CM | POA: Diagnosis not present

## 2016-01-20 DIAGNOSIS — Z6821 Body mass index (BMI) 21.0-21.9, adult: Secondary | ICD-10-CM | POA: Diagnosis not present

## 2016-01-20 DIAGNOSIS — N186 End stage renal disease: Secondary | ICD-10-CM | POA: Diagnosis not present

## 2016-01-20 DIAGNOSIS — Z713 Dietary counseling and surveillance: Secondary | ICD-10-CM | POA: Diagnosis not present

## 2016-01-20 DIAGNOSIS — Z1151 Encounter for screening for human papillomavirus (HPV): Secondary | ICD-10-CM | POA: Diagnosis not present

## 2016-01-20 DIAGNOSIS — Z01419 Encounter for gynecological examination (general) (routine) without abnormal findings: Secondary | ICD-10-CM | POA: Diagnosis not present

## 2016-01-20 DIAGNOSIS — Z940 Kidney transplant status: Secondary | ICD-10-CM | POA: Diagnosis not present

## 2016-01-20 DIAGNOSIS — Z7982 Long term (current) use of aspirin: Secondary | ICD-10-CM | POA: Diagnosis not present

## 2016-01-20 DIAGNOSIS — I120 Hypertensive chronic kidney disease with stage 5 chronic kidney disease or end stage renal disease: Secondary | ICD-10-CM | POA: Diagnosis not present

## 2016-01-20 DIAGNOSIS — Z888 Allergy status to other drugs, medicaments and biological substances status: Secondary | ICD-10-CM | POA: Diagnosis not present

## 2016-01-20 DIAGNOSIS — Z124 Encounter for screening for malignant neoplasm of cervix: Secondary | ICD-10-CM | POA: Diagnosis not present

## 2016-01-21 DIAGNOSIS — Z940 Kidney transplant status: Secondary | ICD-10-CM | POA: Diagnosis not present

## 2016-01-21 DIAGNOSIS — Z79899 Other long term (current) drug therapy: Secondary | ICD-10-CM | POA: Diagnosis not present

## 2016-02-03 DIAGNOSIS — R221 Localized swelling, mass and lump, neck: Secondary | ICD-10-CM | POA: Diagnosis not present

## 2016-02-03 DIAGNOSIS — Z940 Kidney transplant status: Secondary | ICD-10-CM | POA: Diagnosis not present

## 2016-02-05 DIAGNOSIS — M1990 Unspecified osteoarthritis, unspecified site: Secondary | ICD-10-CM | POA: Diagnosis not present

## 2016-02-05 DIAGNOSIS — I120 Hypertensive chronic kidney disease with stage 5 chronic kidney disease or end stage renal disease: Secondary | ICD-10-CM | POA: Diagnosis not present

## 2016-02-05 DIAGNOSIS — L040 Acute lymphadenitis of face, head and neck: Secondary | ICD-10-CM | POA: Diagnosis not present

## 2016-02-05 DIAGNOSIS — G510 Bell's palsy: Secondary | ICD-10-CM | POA: Diagnosis not present

## 2016-02-05 DIAGNOSIS — E875 Hyperkalemia: Secondary | ICD-10-CM | POA: Diagnosis not present

## 2016-02-05 DIAGNOSIS — K219 Gastro-esophageal reflux disease without esophagitis: Secondary | ICD-10-CM | POA: Diagnosis not present

## 2016-02-05 DIAGNOSIS — R221 Localized swelling, mass and lump, neck: Secondary | ICD-10-CM | POA: Diagnosis not present

## 2016-02-05 DIAGNOSIS — N186 End stage renal disease: Secondary | ICD-10-CM | POA: Diagnosis not present

## 2016-02-05 DIAGNOSIS — D631 Anemia in chronic kidney disease: Secondary | ICD-10-CM | POA: Diagnosis not present

## 2016-02-05 DIAGNOSIS — R591 Generalized enlarged lymph nodes: Secondary | ICD-10-CM | POA: Diagnosis not present

## 2016-02-05 DIAGNOSIS — M069 Rheumatoid arthritis, unspecified: Secondary | ICD-10-CM | POA: Diagnosis not present

## 2016-02-05 DIAGNOSIS — Z940 Kidney transplant status: Secondary | ICD-10-CM | POA: Diagnosis not present

## 2016-02-11 DIAGNOSIS — Z940 Kidney transplant status: Secondary | ICD-10-CM | POA: Diagnosis not present

## 2016-02-11 DIAGNOSIS — D631 Anemia in chronic kidney disease: Secondary | ICD-10-CM | POA: Diagnosis not present

## 2016-02-15 DIAGNOSIS — Z79899 Other long term (current) drug therapy: Secondary | ICD-10-CM | POA: Diagnosis not present

## 2016-02-15 DIAGNOSIS — D631 Anemia in chronic kidney disease: Secondary | ICD-10-CM | POA: Diagnosis not present

## 2016-02-15 DIAGNOSIS — E8809 Other disorders of plasma-protein metabolism, not elsewhere classified: Secondary | ICD-10-CM | POA: Diagnosis not present

## 2016-02-15 DIAGNOSIS — Z940 Kidney transplant status: Secondary | ICD-10-CM | POA: Diagnosis not present

## 2016-02-15 DIAGNOSIS — D899 Disorder involving the immune mechanism, unspecified: Secondary | ICD-10-CM | POA: Diagnosis not present

## 2016-02-15 DIAGNOSIS — Z4822 Encounter for aftercare following kidney transplant: Secondary | ICD-10-CM | POA: Diagnosis not present

## 2016-02-15 DIAGNOSIS — Z888 Allergy status to other drugs, medicaments and biological substances status: Secondary | ICD-10-CM | POA: Diagnosis not present

## 2016-02-15 DIAGNOSIS — E872 Acidosis: Secondary | ICD-10-CM | POA: Diagnosis not present

## 2016-02-15 DIAGNOSIS — D649 Anemia, unspecified: Secondary | ICD-10-CM | POA: Diagnosis not present

## 2016-02-15 DIAGNOSIS — I129 Hypertensive chronic kidney disease with stage 1 through stage 4 chronic kidney disease, or unspecified chronic kidney disease: Secondary | ICD-10-CM | POA: Diagnosis not present

## 2016-02-15 DIAGNOSIS — Z7952 Long term (current) use of systemic steroids: Secondary | ICD-10-CM | POA: Diagnosis not present

## 2016-02-15 DIAGNOSIS — Z7982 Long term (current) use of aspirin: Secondary | ICD-10-CM | POA: Diagnosis not present

## 2016-02-15 DIAGNOSIS — N184 Chronic kidney disease, stage 4 (severe): Secondary | ICD-10-CM | POA: Diagnosis not present

## 2016-02-15 DIAGNOSIS — R809 Proteinuria, unspecified: Secondary | ICD-10-CM | POA: Diagnosis not present

## 2016-02-19 ENCOUNTER — Emergency Department (INDEPENDENT_AMBULATORY_CARE_PROVIDER_SITE_OTHER)
Admission: EM | Admit: 2016-02-19 | Discharge: 2016-02-19 | Disposition: A | Discharge status: Home / Self Care | Payer: Medicare Other | Source: Home / Self Care | Attending: Emergency Medicine | Admitting: Emergency Medicine

## 2016-02-19 ENCOUNTER — Encounter (HOSPITAL_COMMUNITY): Payer: Self-pay | Admitting: Emergency Medicine

## 2016-02-19 DIAGNOSIS — A419 Sepsis, unspecified organism: Secondary | ICD-10-CM | POA: Diagnosis not present

## 2016-02-19 DIAGNOSIS — I823 Embolism and thrombosis of renal vein: Secondary | ICD-10-CM | POA: Diagnosis not present

## 2016-02-19 DIAGNOSIS — R918 Other nonspecific abnormal finding of lung field: Secondary | ICD-10-CM | POA: Diagnosis not present

## 2016-02-19 DIAGNOSIS — N186 End stage renal disease: Secondary | ICD-10-CM | POA: Diagnosis not present

## 2016-02-19 DIAGNOSIS — K219 Gastro-esophageal reflux disease without esophagitis: Secondary | ICD-10-CM | POA: Diagnosis present

## 2016-02-19 DIAGNOSIS — R591 Generalized enlarged lymph nodes: Secondary | ICD-10-CM | POA: Diagnosis not present

## 2016-02-19 DIAGNOSIS — Z792 Long term (current) use of antibiotics: Secondary | ICD-10-CM | POA: Diagnosis not present

## 2016-02-19 DIAGNOSIS — R509 Fever, unspecified: Secondary | ICD-10-CM | POA: Diagnosis not present

## 2016-02-19 DIAGNOSIS — I129 Hypertensive chronic kidney disease with stage 1 through stage 4 chronic kidney disease, or unspecified chronic kidney disease: Secondary | ICD-10-CM | POA: Diagnosis not present

## 2016-02-19 DIAGNOSIS — N189 Chronic kidney disease, unspecified: Secondary | ICD-10-CM

## 2016-02-19 DIAGNOSIS — D899 Disorder involving the immune mechanism, unspecified: Secondary | ICD-10-CM | POA: Diagnosis not present

## 2016-02-19 DIAGNOSIS — R110 Nausea: Secondary | ICD-10-CM

## 2016-02-19 DIAGNOSIS — D509 Iron deficiency anemia, unspecified: Secondary | ICD-10-CM | POA: Diagnosis not present

## 2016-02-19 DIAGNOSIS — R935 Abnormal findings on diagnostic imaging of other abdominal regions, including retroperitoneum: Secondary | ICD-10-CM | POA: Diagnosis not present

## 2016-02-19 DIAGNOSIS — R1030 Lower abdominal pain, unspecified: Secondary | ICD-10-CM | POA: Diagnosis not present

## 2016-02-19 DIAGNOSIS — A312 Disseminated mycobacterium avium-intracellulare complex (DMAC): Secondary | ICD-10-CM | POA: Diagnosis not present

## 2016-02-19 DIAGNOSIS — N390 Urinary tract infection, site not specified: Secondary | ICD-10-CM | POA: Diagnosis not present

## 2016-02-19 DIAGNOSIS — R197 Diarrhea, unspecified: Secondary | ICD-10-CM | POA: Diagnosis not present

## 2016-02-19 DIAGNOSIS — R599 Enlarged lymph nodes, unspecified: Secondary | ICD-10-CM | POA: Diagnosis not present

## 2016-02-19 DIAGNOSIS — Z992 Dependence on renal dialysis: Secondary | ICD-10-CM | POA: Diagnosis not present

## 2016-02-19 DIAGNOSIS — A040 Enteropathogenic Escherichia coli infection: Secondary | ICD-10-CM | POA: Diagnosis not present

## 2016-02-19 DIAGNOSIS — R7989 Other specified abnormal findings of blood chemistry: Secondary | ICD-10-CM | POA: Diagnosis not present

## 2016-02-19 DIAGNOSIS — Z888 Allergy status to other drugs, medicaments and biological substances status: Secondary | ICD-10-CM | POA: Diagnosis not present

## 2016-02-19 DIAGNOSIS — N179 Acute kidney failure, unspecified: Secondary | ICD-10-CM

## 2016-02-19 DIAGNOSIS — N184 Chronic kidney disease, stage 4 (severe): Secondary | ICD-10-CM | POA: Diagnosis not present

## 2016-02-19 DIAGNOSIS — K5900 Constipation, unspecified: Secondary | ICD-10-CM | POA: Diagnosis not present

## 2016-02-19 DIAGNOSIS — R000 Tachycardia, unspecified: Secondary | ICD-10-CM | POA: Diagnosis not present

## 2016-02-19 DIAGNOSIS — Z7982 Long term (current) use of aspirin: Secondary | ICD-10-CM | POA: Diagnosis not present

## 2016-02-19 DIAGNOSIS — E872 Acidosis: Secondary | ICD-10-CM | POA: Diagnosis not present

## 2016-02-19 DIAGNOSIS — D72829 Elevated white blood cell count, unspecified: Secondary | ICD-10-CM | POA: Diagnosis not present

## 2016-02-19 DIAGNOSIS — M069 Rheumatoid arthritis, unspecified: Secondary | ICD-10-CM | POA: Diagnosis not present

## 2016-02-19 DIAGNOSIS — R600 Localized edema: Secondary | ICD-10-CM | POA: Diagnosis not present

## 2016-02-19 DIAGNOSIS — T8611 Kidney transplant rejection: Secondary | ICD-10-CM | POA: Diagnosis present

## 2016-02-19 DIAGNOSIS — D62 Acute posthemorrhagic anemia: Secondary | ICD-10-CM | POA: Diagnosis not present

## 2016-02-19 DIAGNOSIS — A044 Other intestinal Escherichia coli infections: Secondary | ICD-10-CM | POA: Diagnosis not present

## 2016-02-19 DIAGNOSIS — Z7952 Long term (current) use of systemic steroids: Secondary | ICD-10-CM | POA: Diagnosis not present

## 2016-02-19 DIAGNOSIS — R590 Localized enlarged lymph nodes: Secondary | ICD-10-CM | POA: Diagnosis not present

## 2016-02-19 DIAGNOSIS — N200 Calculus of kidney: Secondary | ICD-10-CM | POA: Diagnosis not present

## 2016-02-19 DIAGNOSIS — K639 Disease of intestine, unspecified: Secondary | ICD-10-CM | POA: Diagnosis not present

## 2016-02-19 DIAGNOSIS — A046 Enteritis due to Yersinia enterocolitica: Secondary | ICD-10-CM | POA: Diagnosis not present

## 2016-02-19 DIAGNOSIS — Z79899 Other long term (current) drug therapy: Secondary | ICD-10-CM | POA: Diagnosis not present

## 2016-02-19 DIAGNOSIS — I120 Hypertensive chronic kidney disease with stage 5 chronic kidney disease or end stage renal disease: Secondary | ICD-10-CM | POA: Diagnosis not present

## 2016-02-19 DIAGNOSIS — Z940 Kidney transplant status: Secondary | ICD-10-CM | POA: Diagnosis not present

## 2016-02-19 DIAGNOSIS — E875 Hyperkalemia: Secondary | ICD-10-CM | POA: Diagnosis present

## 2016-02-19 DIAGNOSIS — I4581 Long QT syndrome: Secondary | ICD-10-CM | POA: Diagnosis not present

## 2016-02-19 DIAGNOSIS — R112 Nausea with vomiting, unspecified: Secondary | ICD-10-CM | POA: Diagnosis not present

## 2016-02-19 DIAGNOSIS — D631 Anemia in chronic kidney disease: Secondary | ICD-10-CM | POA: Diagnosis not present

## 2016-02-19 DIAGNOSIS — B9620 Unspecified Escherichia coli [E. coli] as the cause of diseases classified elsewhere: Secondary | ICD-10-CM | POA: Diagnosis present

## 2016-02-19 DIAGNOSIS — K429 Umbilical hernia without obstruction or gangrene: Secondary | ICD-10-CM | POA: Diagnosis present

## 2016-02-19 MED ORDER — ONDANSETRON 4 MG PO TBDP
4.0000 mg | ORAL_TABLET | Freq: Once | ORAL | Status: AC
  Administered 2016-02-19: 4 mg via ORAL

## 2016-02-19 MED ORDER — ONDANSETRON 4 MG PO TBDP
ORAL_TABLET | ORAL | Status: AC
  Filled 2016-02-19: qty 1

## 2016-02-19 NOTE — Discharge Instructions (Signed)
When we checked your creatinine, it is elevated at 6.6. With your history, I am concerned that your transplant is failing or you have not been taking enough fluid to maintain the kidney. Please go directly to [facility] emergency room for additional evaluation. I have spoken to the triage nurse there, Marcene Brawn, and they are expecting you.

## 2016-02-19 NOTE — ED Provider Notes (Signed)
CSN: [id number]     Arrival date & time 02/19/16  1311 History   First MD Initiated Contact with Patient 02/19/16 1432     Chief Complaint  Patient presents with  . Abdominal Pain   (Consider location/radiation/quality/duration/timing/severity/associated sxs/prior Treatment) HPI She is a 50 year old woman here for evaluation of abdominal pain. She has a history of 2 kidney transplants. She received the first transplant after medications for her rheumatoid arthritis damage to her kidneys. Her most recent transplant was in 2013. She states that kidney has been failing. Her creatinine has been running around 4.8. One week ago, she developed some crampy abdominal pain and back pain associated with diarrhea and nausea. She does report one episode of vomiting, but attributes this to taking her medications without eating anything first. She has also had a cough and some night sweats. She did have a fever at the start, but denies any fever in the last several days. She reports no appetite, but she has been trying to keep up with her fluids. The abdominal pain is crampy and primarily in the lower abdomen. She states her kidney is in the left lower abdomen. She denies any urinary symptoms. She saw her nephrologist on Monday. At that time they found her magnesium to be low so increased her magnesium dose.  Past Medical History  Diagnosis Date  . Hypertension   . Chronic kidney disease   . Rheumatoid arthritis of the hand   . Anemia     iron def,    Past Surgical History  Procedure Laterality Date  . [place] transplant  2005, 2013     x 2  . Cesarean section  1992  . Hand surgery Right [id number]    silicone where the joints 3 MCP joints   Family History  Problem Relation Age of Onset  . Diabetes Maternal Grandmother   . Cancer Maternal Grandmother 65   Social History  Substance Use Topics  . Smoking status: Never Smoker   . Smokeless tobacco: None  . Alcohol Use: 0.0 oz/week    0 Glasses of  wine per week     Comment: occasional   OB History    No data available     Review of Systems As in history of present illness Allergies  Adhesive and Benazepril  Home Medications   Prior to Admission medications   Medication Sig Start Date End Date Taking? Authorizing Provider  acetaminophen (TYLENOL) 500 MG tablet Take 1,000 mg by mouth every 6 (six) hours as needed for moderate pain.   Yes Historical Provider, MD  labetalol (NORMODYNE) 100 MG tablet Take 100 mg by mouth 2 (two) times daily.    Yes Historical Provider, MD  omeprazole (PRILOSEC) 20 MG capsule Take 20 mg by mouth daily.   Yes Historical Provider, MD  predniSONE (DELTASONE) 5 MG tablet Take 5 mg by mouth daily with breakfast.   Yes Historical Provider, MD  SODIUM BICARBONATE PO Take 2 tablets by mouth 2 (two) times daily.   Yes Historical Provider, MD  tacrolimus (PROGRAF) 5 MG capsule Take 5 mg by mouth 2 (two) times daily.   Yes Historical Provider, MD  acyclovir (ZOVIRAX) 400 MG tablet Take 1 tablet (400 mg total) by mouth 3 (three) times daily. 11/30/15   Theodis Blaze, MD  aspirin 81 MG tablet Take 81 mg by mouth daily.    Historical Provider, MD  Multiple Vitamin (MULTIVITAMIN WITH MINERALS) TABS Take 1 tablet by mouth daily. Women vitamin  Historical Provider, MD  Multiple Vitamins-Minerals (HAIR/SKIN/NAILS) TABS Take 1 tablet by mouth daily.    Historical Provider, MD  mycophenolate (MYFORTIC) 180 MG EC tablet Take 360 mg by mouth 2 (two) times daily.    Historical Provider, MD  sulfamethoxazole-trimethoprim (BACTRIM,SEPTRA) 400-80 MG tablet Take 1 tablet by mouth 3 (three) times a week. Take 1 tablet every Monday, Wednesday,and Friday 09/24/15   Historical Provider, MD   Meds Ordered and Administered this Visit   Medications  ondansetron (ZOFRAN-ODT) disintegrating tablet 4 mg (4 mg Oral Given 02/19/16 1524)    BP 149/94 mmHg  Pulse 123  Temp(Src) 98.1 F (36.7 C) (Oral)  Resp 16  SpO2 100%  LMP  01/10/2015 Orthostatic VS for the past 24 hrs:  BP- Lying Pulse- Lying BP- Sitting Pulse- Sitting BP- Standing at 0 minutes Pulse- Standing at 0 minutes  02/19/16 1524 156/90 mmHg 109 (!) 161/95 mmHg 117 146/89 mmHg 122    Physical Exam  Constitutional: She is oriented to person, place, and time. She appears well-developed and well-nourished. No distress.  Laying on exam table. No acute distress. Does appear fatigued.  HENT:  Lips are slightly dry.  Neck: Neck supple.  Cardiovascular: Normal rate, regular rhythm and normal heart sounds.   No murmur heard. Pulmonary/Chest: Effort normal and breath sounds normal. No respiratory distress. She has no wheezes. She has no rales.  Abdominal: Soft. Bowel sounds are normal. She exhibits no distension. There is tenderness (in suprapubic). There is no rebound and no guarding.  She does have several well-healed surgical incisions in the lower abdomen. No tenderness over the left lower quadrant where kidney is located.  Neurological: She is alert and oriented to person, place, and time.    ED Course  Procedures (including critical care time)  Labs Review Labs Reviewed - No data to display  Imaging Review No results found.  UA positive for bilirubin, blood, protein, and leukocytes. I-STAT notable for a potassium of 5.1 and a creatinine of 6.6.  MDM   1. Acute-on-chronic kidney injury ([place])   2. Diarrhea, unspecified type   3. Lower abdominal pain   4. Nausea    She has an acute elevation of her creatinine. With her history I'm not sure if this is a result of dehydration or a result of a failing kidney transplant. I have recommended transfer to the emergency room. Patient states her kidney doctors and the majority of her care is done through [facility]. Her son is with her. We will discharge the patient and they will drive to [facility] emergency room for additional evaluation. I have spoken to [place], triage nurse in the ER,  to let them know they are coming.    Melony Overly, MD 02/19/16 [phone]

## 2016-02-19 NOTE — ED Notes (Signed)
Pt has been notified to go to [facility] ER.... Dr Bridgett Larsson spoke w/triage nurse at [facility] ER

## 2016-02-19 NOTE — ED Notes (Signed)
C/o abd/back pain onset 3/24 associated w/decreased appetite, diarrhea and feeling nauseas Reports hx of renal disease/kidney transplant... Seen by nephrologist on 3/27 Denies fevers A&O x4... No acute distress.

## 2016-02-22 LAB — POCT URINALYSIS DIP (DEVICE)
GLUCOSE, UA: NEGATIVE mg/dL
Ketones, ur: NEGATIVE mg/dL
NITRITE: NEGATIVE
Protein, ur: >=300 mg/dL — AB
Specific Gravity, Urine: 1.025 (ref 1.005–1.030)
UROBILINOGEN UA: 0.2 mg/dL (ref 0.0–1.0)
pH: 5.5 (ref 5.0–8.0)

## 2016-03-01 DIAGNOSIS — D631 Anemia in chronic kidney disease: Secondary | ICD-10-CM | POA: Diagnosis not present

## 2016-03-01 DIAGNOSIS — N186 End stage renal disease: Secondary | ICD-10-CM | POA: Diagnosis not present

## 2016-03-01 DIAGNOSIS — N2581 Secondary hyperparathyroidism of renal origin: Secondary | ICD-10-CM | POA: Diagnosis not present

## 2016-03-03 DIAGNOSIS — N186 End stage renal disease: Secondary | ICD-10-CM | POA: Diagnosis not present

## 2016-03-03 DIAGNOSIS — N2581 Secondary hyperparathyroidism of renal origin: Secondary | ICD-10-CM | POA: Diagnosis not present

## 2016-03-03 DIAGNOSIS — D631 Anemia in chronic kidney disease: Secondary | ICD-10-CM | POA: Diagnosis not present

## 2016-03-05 DIAGNOSIS — R1013 Epigastric pain: Secondary | ICD-10-CM | POA: Diagnosis not present

## 2016-03-05 DIAGNOSIS — K859 Acute pancreatitis without necrosis or infection, unspecified: Secondary | ICD-10-CM | POA: Diagnosis not present

## 2016-03-05 DIAGNOSIS — I120 Hypertensive chronic kidney disease with stage 5 chronic kidney disease or end stage renal disease: Secondary | ICD-10-CM | POA: Diagnosis not present

## 2016-03-05 DIAGNOSIS — M069 Rheumatoid arthritis, unspecified: Secondary | ICD-10-CM | POA: Diagnosis not present

## 2016-03-05 DIAGNOSIS — K5669 Other intestinal obstruction: Secondary | ICD-10-CM | POA: Diagnosis not present

## 2016-03-05 DIAGNOSIS — R188 Other ascites: Secondary | ICD-10-CM | POA: Diagnosis not present

## 2016-03-05 DIAGNOSIS — K439 Ventral hernia without obstruction or gangrene: Secondary | ICD-10-CM | POA: Diagnosis not present

## 2016-03-05 DIAGNOSIS — K5660 Unspecified intestinal obstruction: Secondary | ICD-10-CM | POA: Diagnosis not present

## 2016-03-05 DIAGNOSIS — K436 Other and unspecified ventral hernia with obstruction, without gangrene: Secondary | ICD-10-CM | POA: Diagnosis not present

## 2016-03-05 DIAGNOSIS — N186 End stage renal disease: Secondary | ICD-10-CM | POA: Diagnosis not present

## 2016-03-08 DIAGNOSIS — D631 Anemia in chronic kidney disease: Secondary | ICD-10-CM | POA: Diagnosis not present

## 2016-03-08 DIAGNOSIS — N186 End stage renal disease: Secondary | ICD-10-CM | POA: Diagnosis not present

## 2016-03-08 DIAGNOSIS — N2581 Secondary hyperparathyroidism of renal origin: Secondary | ICD-10-CM | POA: Diagnosis not present

## 2016-03-10 DIAGNOSIS — D631 Anemia in chronic kidney disease: Secondary | ICD-10-CM | POA: Diagnosis not present

## 2016-03-10 DIAGNOSIS — N186 End stage renal disease: Secondary | ICD-10-CM | POA: Diagnosis not present

## 2016-03-10 DIAGNOSIS — N2581 Secondary hyperparathyroidism of renal origin: Secondary | ICD-10-CM | POA: Diagnosis not present

## 2016-03-12 DIAGNOSIS — N186 End stage renal disease: Secondary | ICD-10-CM | POA: Diagnosis not present

## 2016-03-12 DIAGNOSIS — D631 Anemia in chronic kidney disease: Secondary | ICD-10-CM | POA: Diagnosis not present

## 2016-03-12 DIAGNOSIS — N2581 Secondary hyperparathyroidism of renal origin: Secondary | ICD-10-CM | POA: Diagnosis not present

## 2016-03-15 DIAGNOSIS — N186 End stage renal disease: Secondary | ICD-10-CM | POA: Diagnosis not present

## 2016-03-15 DIAGNOSIS — N2581 Secondary hyperparathyroidism of renal origin: Secondary | ICD-10-CM | POA: Diagnosis not present

## 2016-03-15 DIAGNOSIS — D631 Anemia in chronic kidney disease: Secondary | ICD-10-CM | POA: Diagnosis not present

## 2016-03-17 DIAGNOSIS — N186 End stage renal disease: Secondary | ICD-10-CM | POA: Diagnosis not present

## 2016-03-17 DIAGNOSIS — D631 Anemia in chronic kidney disease: Secondary | ICD-10-CM | POA: Diagnosis not present

## 2016-03-17 DIAGNOSIS — N2581 Secondary hyperparathyroidism of renal origin: Secondary | ICD-10-CM | POA: Diagnosis not present

## 2016-03-19 DIAGNOSIS — N2581 Secondary hyperparathyroidism of renal origin: Secondary | ICD-10-CM | POA: Diagnosis not present

## 2016-03-19 DIAGNOSIS — N186 End stage renal disease: Secondary | ICD-10-CM | POA: Diagnosis not present

## 2016-03-19 DIAGNOSIS — D631 Anemia in chronic kidney disease: Secondary | ICD-10-CM | POA: Diagnosis not present

## 2016-03-20 DIAGNOSIS — N186 End stage renal disease: Secondary | ICD-10-CM | POA: Diagnosis not present

## 2016-03-20 DIAGNOSIS — Z992 Dependence on renal dialysis: Secondary | ICD-10-CM | POA: Diagnosis not present

## 2016-03-22 DIAGNOSIS — D631 Anemia in chronic kidney disease: Secondary | ICD-10-CM | POA: Diagnosis not present

## 2016-03-22 DIAGNOSIS — N2581 Secondary hyperparathyroidism of renal origin: Secondary | ICD-10-CM | POA: Diagnosis not present

## 2016-03-22 DIAGNOSIS — N186 End stage renal disease: Secondary | ICD-10-CM | POA: Diagnosis not present

## 2016-03-22 DIAGNOSIS — Z23 Encounter for immunization: Secondary | ICD-10-CM | POA: Diagnosis not present

## 2016-03-24 DIAGNOSIS — D631 Anemia in chronic kidney disease: Secondary | ICD-10-CM | POA: Diagnosis not present

## 2016-03-24 DIAGNOSIS — Z23 Encounter for immunization: Secondary | ICD-10-CM | POA: Diagnosis not present

## 2016-03-24 DIAGNOSIS — K439 Ventral hernia without obstruction or gangrene: Secondary | ICD-10-CM | POA: Diagnosis not present

## 2016-03-24 DIAGNOSIS — N2581 Secondary hyperparathyroidism of renal origin: Secondary | ICD-10-CM | POA: Diagnosis not present

## 2016-03-24 DIAGNOSIS — I120 Hypertensive chronic kidney disease with stage 5 chronic kidney disease or end stage renal disease: Secondary | ICD-10-CM | POA: Diagnosis not present

## 2016-03-24 DIAGNOSIS — Z940 Kidney transplant status: Secondary | ICD-10-CM | POA: Diagnosis not present

## 2016-03-24 DIAGNOSIS — Z79899 Other long term (current) drug therapy: Secondary | ICD-10-CM | POA: Diagnosis not present

## 2016-03-24 DIAGNOSIS — N186 End stage renal disease: Secondary | ICD-10-CM | POA: Diagnosis not present

## 2016-03-25 DIAGNOSIS — D899 Disorder involving the immune mechanism, unspecified: Secondary | ICD-10-CM | POA: Diagnosis not present

## 2016-03-25 DIAGNOSIS — M0589 Other rheumatoid arthritis with rheumatoid factor of multiple sites: Secondary | ICD-10-CM | POA: Diagnosis not present

## 2016-03-25 DIAGNOSIS — Z888 Allergy status to other drugs, medicaments and biological substances status: Secondary | ICD-10-CM | POA: Diagnosis not present

## 2016-03-25 DIAGNOSIS — Z79899 Other long term (current) drug therapy: Secondary | ICD-10-CM | POA: Diagnosis not present

## 2016-03-25 DIAGNOSIS — I120 Hypertensive chronic kidney disease with stage 5 chronic kidney disease or end stage renal disease: Secondary | ICD-10-CM | POA: Diagnosis not present

## 2016-03-25 DIAGNOSIS — N186 End stage renal disease: Secondary | ICD-10-CM | POA: Diagnosis not present

## 2016-03-25 DIAGNOSIS — D631 Anemia in chronic kidney disease: Secondary | ICD-10-CM | POA: Diagnosis not present

## 2016-03-25 DIAGNOSIS — Z7952 Long term (current) use of systemic steroids: Secondary | ICD-10-CM | POA: Diagnosis not present

## 2016-03-25 DIAGNOSIS — Z87440 Personal history of urinary (tract) infections: Secondary | ICD-10-CM | POA: Diagnosis not present

## 2016-03-25 DIAGNOSIS — Z940 Kidney transplant status: Secondary | ICD-10-CM | POA: Diagnosis not present

## 2016-03-25 DIAGNOSIS — Z91048 Other nonmedicinal substance allergy status: Secondary | ICD-10-CM | POA: Diagnosis not present

## 2016-03-25 DIAGNOSIS — A312 Disseminated mycobacterium avium-intracellulare complex (DMAC): Secondary | ICD-10-CM | POA: Diagnosis not present

## 2016-03-26 DIAGNOSIS — N2581 Secondary hyperparathyroidism of renal origin: Secondary | ICD-10-CM | POA: Diagnosis not present

## 2016-03-26 DIAGNOSIS — N186 End stage renal disease: Secondary | ICD-10-CM | POA: Diagnosis not present

## 2016-03-26 DIAGNOSIS — Z23 Encounter for immunization: Secondary | ICD-10-CM | POA: Diagnosis not present

## 2016-03-26 DIAGNOSIS — D631 Anemia in chronic kidney disease: Secondary | ICD-10-CM | POA: Diagnosis not present

## 2016-03-29 DIAGNOSIS — N186 End stage renal disease: Secondary | ICD-10-CM | POA: Diagnosis not present

## 2016-03-29 DIAGNOSIS — D631 Anemia in chronic kidney disease: Secondary | ICD-10-CM | POA: Diagnosis not present

## 2016-03-29 DIAGNOSIS — N2581 Secondary hyperparathyroidism of renal origin: Secondary | ICD-10-CM | POA: Diagnosis not present

## 2016-03-29 DIAGNOSIS — Z23 Encounter for immunization: Secondary | ICD-10-CM | POA: Diagnosis not present

## 2016-03-31 DIAGNOSIS — D631 Anemia in chronic kidney disease: Secondary | ICD-10-CM | POA: Diagnosis not present

## 2016-03-31 DIAGNOSIS — Z23 Encounter for immunization: Secondary | ICD-10-CM | POA: Diagnosis not present

## 2016-03-31 DIAGNOSIS — N2581 Secondary hyperparathyroidism of renal origin: Secondary | ICD-10-CM | POA: Diagnosis not present

## 2016-03-31 DIAGNOSIS — N186 End stage renal disease: Secondary | ICD-10-CM | POA: Diagnosis not present

## 2016-04-02 DIAGNOSIS — N186 End stage renal disease: Secondary | ICD-10-CM | POA: Diagnosis not present

## 2016-04-02 DIAGNOSIS — D631 Anemia in chronic kidney disease: Secondary | ICD-10-CM | POA: Diagnosis not present

## 2016-04-02 DIAGNOSIS — N2581 Secondary hyperparathyroidism of renal origin: Secondary | ICD-10-CM | POA: Diagnosis not present

## 2016-04-02 DIAGNOSIS — Z23 Encounter for immunization: Secondary | ICD-10-CM | POA: Diagnosis not present

## 2016-04-02 DIAGNOSIS — N189 Chronic kidney disease, unspecified: Secondary | ICD-10-CM | POA: Diagnosis not present

## 2016-04-05 DIAGNOSIS — N186 End stage renal disease: Secondary | ICD-10-CM | POA: Diagnosis not present

## 2016-04-05 DIAGNOSIS — N2581 Secondary hyperparathyroidism of renal origin: Secondary | ICD-10-CM | POA: Diagnosis not present

## 2016-04-05 DIAGNOSIS — D631 Anemia in chronic kidney disease: Secondary | ICD-10-CM | POA: Diagnosis not present

## 2016-04-05 DIAGNOSIS — Z23 Encounter for immunization: Secondary | ICD-10-CM | POA: Diagnosis not present

## 2016-04-07 DIAGNOSIS — N186 End stage renal disease: Secondary | ICD-10-CM | POA: Diagnosis not present

## 2016-04-07 DIAGNOSIS — D631 Anemia in chronic kidney disease: Secondary | ICD-10-CM | POA: Diagnosis not present

## 2016-04-07 DIAGNOSIS — N2581 Secondary hyperparathyroidism of renal origin: Secondary | ICD-10-CM | POA: Diagnosis not present

## 2016-04-07 DIAGNOSIS — Z23 Encounter for immunization: Secondary | ICD-10-CM | POA: Diagnosis not present

## 2016-04-09 DIAGNOSIS — N2581 Secondary hyperparathyroidism of renal origin: Secondary | ICD-10-CM | POA: Diagnosis not present

## 2016-04-09 DIAGNOSIS — N186 End stage renal disease: Secondary | ICD-10-CM | POA: Diagnosis not present

## 2016-04-09 DIAGNOSIS — D631 Anemia in chronic kidney disease: Secondary | ICD-10-CM | POA: Diagnosis not present

## 2016-04-09 DIAGNOSIS — Z23 Encounter for immunization: Secondary | ICD-10-CM | POA: Diagnosis not present

## 2016-04-12 DIAGNOSIS — D631 Anemia in chronic kidney disease: Secondary | ICD-10-CM | POA: Diagnosis not present

## 2016-04-12 DIAGNOSIS — Z23 Encounter for immunization: Secondary | ICD-10-CM | POA: Diagnosis not present

## 2016-04-12 DIAGNOSIS — N186 End stage renal disease: Secondary | ICD-10-CM | POA: Diagnosis not present

## 2016-04-12 DIAGNOSIS — N2581 Secondary hyperparathyroidism of renal origin: Secondary | ICD-10-CM | POA: Diagnosis not present

## 2016-04-14 DIAGNOSIS — D631 Anemia in chronic kidney disease: Secondary | ICD-10-CM | POA: Diagnosis not present

## 2016-04-14 DIAGNOSIS — Z23 Encounter for immunization: Secondary | ICD-10-CM | POA: Diagnosis not present

## 2016-04-14 DIAGNOSIS — N186 End stage renal disease: Secondary | ICD-10-CM | POA: Diagnosis not present

## 2016-04-14 DIAGNOSIS — N2581 Secondary hyperparathyroidism of renal origin: Secondary | ICD-10-CM | POA: Diagnosis not present

## 2016-04-16 DIAGNOSIS — N2581 Secondary hyperparathyroidism of renal origin: Secondary | ICD-10-CM | POA: Diagnosis not present

## 2016-04-16 DIAGNOSIS — Z23 Encounter for immunization: Secondary | ICD-10-CM | POA: Diagnosis not present

## 2016-04-16 DIAGNOSIS — D631 Anemia in chronic kidney disease: Secondary | ICD-10-CM | POA: Diagnosis not present

## 2016-04-16 DIAGNOSIS — N186 End stage renal disease: Secondary | ICD-10-CM | POA: Diagnosis not present

## 2016-04-19 DIAGNOSIS — N186 End stage renal disease: Secondary | ICD-10-CM | POA: Diagnosis not present

## 2016-04-19 DIAGNOSIS — Z23 Encounter for immunization: Secondary | ICD-10-CM | POA: Diagnosis not present

## 2016-04-19 DIAGNOSIS — N2581 Secondary hyperparathyroidism of renal origin: Secondary | ICD-10-CM | POA: Diagnosis not present

## 2016-04-19 DIAGNOSIS — D631 Anemia in chronic kidney disease: Secondary | ICD-10-CM | POA: Diagnosis not present

## 2016-04-20 DIAGNOSIS — Z992 Dependence on renal dialysis: Secondary | ICD-10-CM | POA: Diagnosis not present

## 2016-04-20 DIAGNOSIS — N186 End stage renal disease: Secondary | ICD-10-CM | POA: Diagnosis not present

## 2016-04-21 DIAGNOSIS — L853 Xerosis cutis: Secondary | ICD-10-CM | POA: Diagnosis not present

## 2016-04-21 DIAGNOSIS — N186 End stage renal disease: Secondary | ICD-10-CM | POA: Diagnosis not present

## 2016-04-21 DIAGNOSIS — Z23 Encounter for immunization: Secondary | ICD-10-CM | POA: Diagnosis not present

## 2016-04-21 DIAGNOSIS — D631 Anemia in chronic kidney disease: Secondary | ICD-10-CM | POA: Diagnosis not present

## 2016-04-21 DIAGNOSIS — D509 Iron deficiency anemia, unspecified: Secondary | ICD-10-CM | POA: Diagnosis not present

## 2016-04-21 DIAGNOSIS — N2581 Secondary hyperparathyroidism of renal origin: Secondary | ICD-10-CM | POA: Diagnosis not present

## 2016-04-23 DIAGNOSIS — D509 Iron deficiency anemia, unspecified: Secondary | ICD-10-CM | POA: Diagnosis not present

## 2016-04-23 DIAGNOSIS — D631 Anemia in chronic kidney disease: Secondary | ICD-10-CM | POA: Diagnosis not present

## 2016-04-23 DIAGNOSIS — N2581 Secondary hyperparathyroidism of renal origin: Secondary | ICD-10-CM | POA: Diagnosis not present

## 2016-04-23 DIAGNOSIS — N186 End stage renal disease: Secondary | ICD-10-CM | POA: Diagnosis not present

## 2016-04-23 DIAGNOSIS — Z23 Encounter for immunization: Secondary | ICD-10-CM | POA: Diagnosis not present

## 2016-04-25 DIAGNOSIS — Z888 Allergy status to other drugs, medicaments and biological substances status: Secondary | ICD-10-CM | POA: Diagnosis not present

## 2016-04-25 DIAGNOSIS — H5213 Myopia, bilateral: Secondary | ICD-10-CM | POA: Diagnosis not present

## 2016-04-25 DIAGNOSIS — G510 Bell's palsy: Secondary | ICD-10-CM | POA: Diagnosis not present

## 2016-04-25 DIAGNOSIS — Z79899 Other long term (current) drug therapy: Secondary | ICD-10-CM | POA: Diagnosis not present

## 2016-04-25 DIAGNOSIS — H31103 Choroidal degeneration, unspecified, bilateral: Secondary | ICD-10-CM | POA: Diagnosis not present

## 2016-04-25 DIAGNOSIS — H11133 Conjunctival pigmentations, bilateral: Secondary | ICD-10-CM | POA: Diagnosis not present

## 2016-04-25 DIAGNOSIS — Z940 Kidney transplant status: Secondary | ICD-10-CM | POA: Diagnosis not present

## 2016-04-25 DIAGNOSIS — H35363 Drusen (degenerative) of macula, bilateral: Secondary | ICD-10-CM | POA: Diagnosis not present

## 2016-04-25 DIAGNOSIS — H524 Presbyopia: Secondary | ICD-10-CM | POA: Diagnosis not present

## 2016-04-25 DIAGNOSIS — H52203 Unspecified astigmatism, bilateral: Secondary | ICD-10-CM | POA: Diagnosis not present

## 2016-04-25 DIAGNOSIS — H527 Unspecified disorder of refraction: Secondary | ICD-10-CM | POA: Diagnosis not present

## 2016-04-25 DIAGNOSIS — I120 Hypertensive chronic kidney disease with stage 5 chronic kidney disease or end stage renal disease: Secondary | ICD-10-CM | POA: Diagnosis not present

## 2016-04-25 DIAGNOSIS — N186 End stage renal disease: Secondary | ICD-10-CM | POA: Diagnosis not present

## 2016-04-26 DIAGNOSIS — D509 Iron deficiency anemia, unspecified: Secondary | ICD-10-CM | POA: Diagnosis not present

## 2016-04-26 DIAGNOSIS — N186 End stage renal disease: Secondary | ICD-10-CM | POA: Diagnosis not present

## 2016-04-26 DIAGNOSIS — N2581 Secondary hyperparathyroidism of renal origin: Secondary | ICD-10-CM | POA: Diagnosis not present

## 2016-04-26 DIAGNOSIS — D631 Anemia in chronic kidney disease: Secondary | ICD-10-CM | POA: Diagnosis not present

## 2016-04-26 DIAGNOSIS — Z23 Encounter for immunization: Secondary | ICD-10-CM | POA: Diagnosis not present

## 2016-04-28 DIAGNOSIS — Z23 Encounter for immunization: Secondary | ICD-10-CM | POA: Diagnosis not present

## 2016-04-28 DIAGNOSIS — D631 Anemia in chronic kidney disease: Secondary | ICD-10-CM | POA: Diagnosis not present

## 2016-04-28 DIAGNOSIS — N2581 Secondary hyperparathyroidism of renal origin: Secondary | ICD-10-CM | POA: Diagnosis not present

## 2016-04-28 DIAGNOSIS — K439 Ventral hernia without obstruction or gangrene: Secondary | ICD-10-CM | POA: Diagnosis not present

## 2016-04-28 DIAGNOSIS — D509 Iron deficiency anemia, unspecified: Secondary | ICD-10-CM | POA: Diagnosis not present

## 2016-04-28 DIAGNOSIS — N186 End stage renal disease: Secondary | ICD-10-CM | POA: Diagnosis not present

## 2016-04-29 DIAGNOSIS — D631 Anemia in chronic kidney disease: Secondary | ICD-10-CM | POA: Diagnosis not present

## 2016-04-29 DIAGNOSIS — Z992 Dependence on renal dialysis: Secondary | ICD-10-CM | POA: Diagnosis not present

## 2016-04-29 DIAGNOSIS — I120 Hypertensive chronic kidney disease with stage 5 chronic kidney disease or end stage renal disease: Secondary | ICD-10-CM | POA: Diagnosis not present

## 2016-04-29 DIAGNOSIS — Z940 Kidney transplant status: Secondary | ICD-10-CM | POA: Diagnosis not present

## 2016-04-29 DIAGNOSIS — Z4822 Encounter for aftercare following kidney transplant: Secondary | ICD-10-CM | POA: Diagnosis not present

## 2016-04-29 DIAGNOSIS — N186 End stage renal disease: Secondary | ICD-10-CM | POA: Diagnosis not present

## 2016-04-29 DIAGNOSIS — Z79899 Other long term (current) drug therapy: Secondary | ICD-10-CM | POA: Diagnosis not present

## 2016-04-29 DIAGNOSIS — E8809 Other disorders of plasma-protein metabolism, not elsewhere classified: Secondary | ICD-10-CM | POA: Diagnosis not present

## 2016-04-30 DIAGNOSIS — Z23 Encounter for immunization: Secondary | ICD-10-CM | POA: Diagnosis not present

## 2016-04-30 DIAGNOSIS — N2581 Secondary hyperparathyroidism of renal origin: Secondary | ICD-10-CM | POA: Diagnosis not present

## 2016-04-30 DIAGNOSIS — D631 Anemia in chronic kidney disease: Secondary | ICD-10-CM | POA: Diagnosis not present

## 2016-04-30 DIAGNOSIS — D509 Iron deficiency anemia, unspecified: Secondary | ICD-10-CM | POA: Diagnosis not present

## 2016-04-30 DIAGNOSIS — N186 End stage renal disease: Secondary | ICD-10-CM | POA: Diagnosis not present

## 2016-05-03 DIAGNOSIS — N2581 Secondary hyperparathyroidism of renal origin: Secondary | ICD-10-CM | POA: Diagnosis not present

## 2016-05-03 DIAGNOSIS — D631 Anemia in chronic kidney disease: Secondary | ICD-10-CM | POA: Diagnosis not present

## 2016-05-03 DIAGNOSIS — D509 Iron deficiency anemia, unspecified: Secondary | ICD-10-CM | POA: Diagnosis not present

## 2016-05-03 DIAGNOSIS — N186 End stage renal disease: Secondary | ICD-10-CM | POA: Diagnosis not present

## 2016-05-03 DIAGNOSIS — Z23 Encounter for immunization: Secondary | ICD-10-CM | POA: Diagnosis not present

## 2016-05-05 DIAGNOSIS — Z23 Encounter for immunization: Secondary | ICD-10-CM | POA: Diagnosis not present

## 2016-05-05 DIAGNOSIS — D509 Iron deficiency anemia, unspecified: Secondary | ICD-10-CM | POA: Diagnosis not present

## 2016-05-05 DIAGNOSIS — D631 Anemia in chronic kidney disease: Secondary | ICD-10-CM | POA: Diagnosis not present

## 2016-05-05 DIAGNOSIS — N2581 Secondary hyperparathyroidism of renal origin: Secondary | ICD-10-CM | POA: Diagnosis not present

## 2016-05-05 DIAGNOSIS — N186 End stage renal disease: Secondary | ICD-10-CM | POA: Diagnosis not present

## 2016-05-07 DIAGNOSIS — Z23 Encounter for immunization: Secondary | ICD-10-CM | POA: Diagnosis not present

## 2016-05-07 DIAGNOSIS — D631 Anemia in chronic kidney disease: Secondary | ICD-10-CM | POA: Diagnosis not present

## 2016-05-07 DIAGNOSIS — N186 End stage renal disease: Secondary | ICD-10-CM | POA: Diagnosis not present

## 2016-05-07 DIAGNOSIS — N2581 Secondary hyperparathyroidism of renal origin: Secondary | ICD-10-CM | POA: Diagnosis not present

## 2016-05-07 DIAGNOSIS — D509 Iron deficiency anemia, unspecified: Secondary | ICD-10-CM | POA: Diagnosis not present

## 2016-05-10 DIAGNOSIS — Z23 Encounter for immunization: Secondary | ICD-10-CM | POA: Diagnosis not present

## 2016-05-10 DIAGNOSIS — D509 Iron deficiency anemia, unspecified: Secondary | ICD-10-CM | POA: Diagnosis not present

## 2016-05-10 DIAGNOSIS — N186 End stage renal disease: Secondary | ICD-10-CM | POA: Diagnosis not present

## 2016-05-10 DIAGNOSIS — N2581 Secondary hyperparathyroidism of renal origin: Secondary | ICD-10-CM | POA: Diagnosis not present

## 2016-05-10 DIAGNOSIS — D631 Anemia in chronic kidney disease: Secondary | ICD-10-CM | POA: Diagnosis not present

## 2016-05-12 DIAGNOSIS — D509 Iron deficiency anemia, unspecified: Secondary | ICD-10-CM | POA: Diagnosis not present

## 2016-05-12 DIAGNOSIS — Z23 Encounter for immunization: Secondary | ICD-10-CM | POA: Diagnosis not present

## 2016-05-12 DIAGNOSIS — N2581 Secondary hyperparathyroidism of renal origin: Secondary | ICD-10-CM | POA: Diagnosis not present

## 2016-05-12 DIAGNOSIS — N186 End stage renal disease: Secondary | ICD-10-CM | POA: Diagnosis not present

## 2016-05-12 DIAGNOSIS — D631 Anemia in chronic kidney disease: Secondary | ICD-10-CM | POA: Diagnosis not present

## 2016-05-14 DIAGNOSIS — D509 Iron deficiency anemia, unspecified: Secondary | ICD-10-CM | POA: Diagnosis not present

## 2016-05-14 DIAGNOSIS — N2581 Secondary hyperparathyroidism of renal origin: Secondary | ICD-10-CM | POA: Diagnosis not present

## 2016-05-14 DIAGNOSIS — D631 Anemia in chronic kidney disease: Secondary | ICD-10-CM | POA: Diagnosis not present

## 2016-05-14 DIAGNOSIS — Z23 Encounter for immunization: Secondary | ICD-10-CM | POA: Diagnosis not present

## 2016-05-14 DIAGNOSIS — N186 End stage renal disease: Secondary | ICD-10-CM | POA: Diagnosis not present

## 2016-05-17 DIAGNOSIS — N186 End stage renal disease: Secondary | ICD-10-CM | POA: Diagnosis not present

## 2016-05-17 DIAGNOSIS — N2581 Secondary hyperparathyroidism of renal origin: Secondary | ICD-10-CM | POA: Diagnosis not present

## 2016-05-17 DIAGNOSIS — D631 Anemia in chronic kidney disease: Secondary | ICD-10-CM | POA: Diagnosis not present

## 2016-05-17 DIAGNOSIS — D509 Iron deficiency anemia, unspecified: Secondary | ICD-10-CM | POA: Diagnosis not present

## 2016-05-17 DIAGNOSIS — Z23 Encounter for immunization: Secondary | ICD-10-CM | POA: Diagnosis not present

## 2016-05-19 DIAGNOSIS — N2581 Secondary hyperparathyroidism of renal origin: Secondary | ICD-10-CM | POA: Diagnosis not present

## 2016-05-19 DIAGNOSIS — D509 Iron deficiency anemia, unspecified: Secondary | ICD-10-CM | POA: Diagnosis not present

## 2016-05-19 DIAGNOSIS — Z23 Encounter for immunization: Secondary | ICD-10-CM | POA: Diagnosis not present

## 2016-05-19 DIAGNOSIS — D631 Anemia in chronic kidney disease: Secondary | ICD-10-CM | POA: Diagnosis not present

## 2016-05-19 DIAGNOSIS — N186 End stage renal disease: Secondary | ICD-10-CM | POA: Diagnosis not present

## 2016-05-20 DIAGNOSIS — N186 End stage renal disease: Secondary | ICD-10-CM | POA: Diagnosis not present

## 2016-05-20 DIAGNOSIS — Z992 Dependence on renal dialysis: Secondary | ICD-10-CM | POA: Diagnosis not present

## 2016-05-21 DIAGNOSIS — N2581 Secondary hyperparathyroidism of renal origin: Secondary | ICD-10-CM | POA: Diagnosis not present

## 2016-05-21 DIAGNOSIS — N186 End stage renal disease: Secondary | ICD-10-CM | POA: Diagnosis not present

## 2016-05-21 DIAGNOSIS — D631 Anemia in chronic kidney disease: Secondary | ICD-10-CM | POA: Diagnosis not present

## 2016-05-21 DIAGNOSIS — Z23 Encounter for immunization: Secondary | ICD-10-CM | POA: Diagnosis not present

## 2016-05-21 DIAGNOSIS — D509 Iron deficiency anemia, unspecified: Secondary | ICD-10-CM | POA: Diagnosis not present

## 2016-05-24 DIAGNOSIS — D509 Iron deficiency anemia, unspecified: Secondary | ICD-10-CM | POA: Diagnosis not present

## 2016-05-24 DIAGNOSIS — D631 Anemia in chronic kidney disease: Secondary | ICD-10-CM | POA: Diagnosis not present

## 2016-05-24 DIAGNOSIS — Z23 Encounter for immunization: Secondary | ICD-10-CM | POA: Diagnosis not present

## 2016-05-24 DIAGNOSIS — N2581 Secondary hyperparathyroidism of renal origin: Secondary | ICD-10-CM | POA: Diagnosis not present

## 2016-05-24 DIAGNOSIS — N186 End stage renal disease: Secondary | ICD-10-CM | POA: Diagnosis not present

## 2016-05-26 DIAGNOSIS — Z23 Encounter for immunization: Secondary | ICD-10-CM | POA: Diagnosis not present

## 2016-05-26 DIAGNOSIS — N186 End stage renal disease: Secondary | ICD-10-CM | POA: Diagnosis not present

## 2016-05-26 DIAGNOSIS — D509 Iron deficiency anemia, unspecified: Secondary | ICD-10-CM | POA: Diagnosis not present

## 2016-05-26 DIAGNOSIS — N2581 Secondary hyperparathyroidism of renal origin: Secondary | ICD-10-CM | POA: Diagnosis not present

## 2016-05-26 DIAGNOSIS — D631 Anemia in chronic kidney disease: Secondary | ICD-10-CM | POA: Diagnosis not present

## 2016-05-28 DIAGNOSIS — N186 End stage renal disease: Secondary | ICD-10-CM | POA: Diagnosis not present

## 2016-05-28 DIAGNOSIS — N2581 Secondary hyperparathyroidism of renal origin: Secondary | ICD-10-CM | POA: Diagnosis not present

## 2016-05-28 DIAGNOSIS — Z23 Encounter for immunization: Secondary | ICD-10-CM | POA: Diagnosis not present

## 2016-05-28 DIAGNOSIS — D509 Iron deficiency anemia, unspecified: Secondary | ICD-10-CM | POA: Diagnosis not present

## 2016-05-28 DIAGNOSIS — D631 Anemia in chronic kidney disease: Secondary | ICD-10-CM | POA: Diagnosis not present

## 2016-05-31 DIAGNOSIS — N2581 Secondary hyperparathyroidism of renal origin: Secondary | ICD-10-CM | POA: Diagnosis not present

## 2016-05-31 DIAGNOSIS — D509 Iron deficiency anemia, unspecified: Secondary | ICD-10-CM | POA: Diagnosis not present

## 2016-05-31 DIAGNOSIS — D631 Anemia in chronic kidney disease: Secondary | ICD-10-CM | POA: Diagnosis not present

## 2016-05-31 DIAGNOSIS — N186 End stage renal disease: Secondary | ICD-10-CM | POA: Diagnosis not present

## 2016-05-31 DIAGNOSIS — Z23 Encounter for immunization: Secondary | ICD-10-CM | POA: Diagnosis not present

## 2016-06-02 DIAGNOSIS — D509 Iron deficiency anemia, unspecified: Secondary | ICD-10-CM | POA: Diagnosis not present

## 2016-06-02 DIAGNOSIS — Z23 Encounter for immunization: Secondary | ICD-10-CM | POA: Diagnosis not present

## 2016-06-02 DIAGNOSIS — N2581 Secondary hyperparathyroidism of renal origin: Secondary | ICD-10-CM | POA: Diagnosis not present

## 2016-06-02 DIAGNOSIS — D631 Anemia in chronic kidney disease: Secondary | ICD-10-CM | POA: Diagnosis not present

## 2016-06-02 DIAGNOSIS — N186 End stage renal disease: Secondary | ICD-10-CM | POA: Diagnosis not present

## 2016-06-04 DIAGNOSIS — Z23 Encounter for immunization: Secondary | ICD-10-CM | POA: Diagnosis not present

## 2016-06-04 DIAGNOSIS — D631 Anemia in chronic kidney disease: Secondary | ICD-10-CM | POA: Diagnosis not present

## 2016-06-04 DIAGNOSIS — N186 End stage renal disease: Secondary | ICD-10-CM | POA: Diagnosis not present

## 2016-06-04 DIAGNOSIS — D509 Iron deficiency anemia, unspecified: Secondary | ICD-10-CM | POA: Diagnosis not present

## 2016-06-04 DIAGNOSIS — N2581 Secondary hyperparathyroidism of renal origin: Secondary | ICD-10-CM | POA: Diagnosis not present

## 2016-06-07 DIAGNOSIS — D631 Anemia in chronic kidney disease: Secondary | ICD-10-CM | POA: Diagnosis not present

## 2016-06-07 DIAGNOSIS — N2581 Secondary hyperparathyroidism of renal origin: Secondary | ICD-10-CM | POA: Diagnosis not present

## 2016-06-07 DIAGNOSIS — Z23 Encounter for immunization: Secondary | ICD-10-CM | POA: Diagnosis not present

## 2016-06-07 DIAGNOSIS — N186 End stage renal disease: Secondary | ICD-10-CM | POA: Diagnosis not present

## 2016-06-07 DIAGNOSIS — D509 Iron deficiency anemia, unspecified: Secondary | ICD-10-CM | POA: Diagnosis not present

## 2016-06-09 DIAGNOSIS — Z23 Encounter for immunization: Secondary | ICD-10-CM | POA: Diagnosis not present

## 2016-06-09 DIAGNOSIS — D631 Anemia in chronic kidney disease: Secondary | ICD-10-CM | POA: Diagnosis not present

## 2016-06-09 DIAGNOSIS — N186 End stage renal disease: Secondary | ICD-10-CM | POA: Diagnosis not present

## 2016-06-09 DIAGNOSIS — N2581 Secondary hyperparathyroidism of renal origin: Secondary | ICD-10-CM | POA: Diagnosis not present

## 2016-06-09 DIAGNOSIS — D509 Iron deficiency anemia, unspecified: Secondary | ICD-10-CM | POA: Diagnosis not present

## 2016-06-11 DIAGNOSIS — Z23 Encounter for immunization: Secondary | ICD-10-CM | POA: Diagnosis not present

## 2016-06-11 DIAGNOSIS — D631 Anemia in chronic kidney disease: Secondary | ICD-10-CM | POA: Diagnosis not present

## 2016-06-11 DIAGNOSIS — N2581 Secondary hyperparathyroidism of renal origin: Secondary | ICD-10-CM | POA: Diagnosis not present

## 2016-06-11 DIAGNOSIS — D509 Iron deficiency anemia, unspecified: Secondary | ICD-10-CM | POA: Diagnosis not present

## 2016-06-11 DIAGNOSIS — N186 End stage renal disease: Secondary | ICD-10-CM | POA: Diagnosis not present

## 2016-06-14 DIAGNOSIS — I120 Hypertensive chronic kidney disease with stage 5 chronic kidney disease or end stage renal disease: Secondary | ICD-10-CM | POA: Diagnosis not present

## 2016-06-14 DIAGNOSIS — Z79899 Other long term (current) drug therapy: Secondary | ICD-10-CM | POA: Diagnosis not present

## 2016-06-14 DIAGNOSIS — D899 Disorder involving the immune mechanism, unspecified: Secondary | ICD-10-CM | POA: Diagnosis not present

## 2016-06-14 DIAGNOSIS — D509 Iron deficiency anemia, unspecified: Secondary | ICD-10-CM | POA: Diagnosis not present

## 2016-06-14 DIAGNOSIS — Z992 Dependence on renal dialysis: Secondary | ICD-10-CM | POA: Diagnosis not present

## 2016-06-14 DIAGNOSIS — G510 Bell's palsy: Secondary | ICD-10-CM | POA: Diagnosis not present

## 2016-06-14 DIAGNOSIS — N2581 Secondary hyperparathyroidism of renal origin: Secondary | ICD-10-CM | POA: Diagnosis not present

## 2016-06-14 DIAGNOSIS — Z888 Allergy status to other drugs, medicaments and biological substances status: Secondary | ICD-10-CM | POA: Diagnosis not present

## 2016-06-14 DIAGNOSIS — R5382 Chronic fatigue, unspecified: Secondary | ICD-10-CM | POA: Diagnosis not present

## 2016-06-14 DIAGNOSIS — N186 End stage renal disease: Secondary | ICD-10-CM | POA: Diagnosis not present

## 2016-06-14 DIAGNOSIS — Z23 Encounter for immunization: Secondary | ICD-10-CM | POA: Diagnosis not present

## 2016-06-14 DIAGNOSIS — I10 Essential (primary) hypertension: Secondary | ICD-10-CM | POA: Diagnosis not present

## 2016-06-14 DIAGNOSIS — D631 Anemia in chronic kidney disease: Secondary | ICD-10-CM | POA: Diagnosis not present

## 2016-06-16 DIAGNOSIS — Z23 Encounter for immunization: Secondary | ICD-10-CM | POA: Diagnosis not present

## 2016-06-16 DIAGNOSIS — D509 Iron deficiency anemia, unspecified: Secondary | ICD-10-CM | POA: Diagnosis not present

## 2016-06-16 DIAGNOSIS — D631 Anemia in chronic kidney disease: Secondary | ICD-10-CM | POA: Diagnosis not present

## 2016-06-16 DIAGNOSIS — N186 End stage renal disease: Secondary | ICD-10-CM | POA: Diagnosis not present

## 2016-06-16 DIAGNOSIS — N2581 Secondary hyperparathyroidism of renal origin: Secondary | ICD-10-CM | POA: Diagnosis not present

## 2016-06-18 DIAGNOSIS — Z23 Encounter for immunization: Secondary | ICD-10-CM | POA: Diagnosis not present

## 2016-06-18 DIAGNOSIS — D509 Iron deficiency anemia, unspecified: Secondary | ICD-10-CM | POA: Diagnosis not present

## 2016-06-18 DIAGNOSIS — N186 End stage renal disease: Secondary | ICD-10-CM | POA: Diagnosis not present

## 2016-06-18 DIAGNOSIS — D631 Anemia in chronic kidney disease: Secondary | ICD-10-CM | POA: Diagnosis not present

## 2016-06-18 DIAGNOSIS — N2581 Secondary hyperparathyroidism of renal origin: Secondary | ICD-10-CM | POA: Diagnosis not present

## 2016-06-20 DIAGNOSIS — N186 End stage renal disease: Secondary | ICD-10-CM | POA: Diagnosis not present

## 2016-06-20 DIAGNOSIS — Z992 Dependence on renal dialysis: Secondary | ICD-10-CM | POA: Diagnosis not present

## 2016-06-21 DIAGNOSIS — D509 Iron deficiency anemia, unspecified: Secondary | ICD-10-CM | POA: Diagnosis not present

## 2016-06-21 DIAGNOSIS — D631 Anemia in chronic kidney disease: Secondary | ICD-10-CM | POA: Diagnosis not present

## 2016-06-21 DIAGNOSIS — Z23 Encounter for immunization: Secondary | ICD-10-CM | POA: Diagnosis not present

## 2016-06-21 DIAGNOSIS — N186 End stage renal disease: Secondary | ICD-10-CM | POA: Diagnosis not present

## 2016-06-28 DIAGNOSIS — D631 Anemia in chronic kidney disease: Secondary | ICD-10-CM | POA: Diagnosis not present

## 2016-06-28 DIAGNOSIS — D509 Iron deficiency anemia, unspecified: Secondary | ICD-10-CM | POA: Diagnosis not present

## 2016-06-28 DIAGNOSIS — N186 End stage renal disease: Secondary | ICD-10-CM | POA: Diagnosis not present

## 2016-06-30 DIAGNOSIS — Z992 Dependence on renal dialysis: Secondary | ICD-10-CM | POA: Diagnosis not present

## 2016-06-30 DIAGNOSIS — N186 End stage renal disease: Secondary | ICD-10-CM | POA: Diagnosis not present

## 2016-06-30 DIAGNOSIS — D509 Iron deficiency anemia, unspecified: Secondary | ICD-10-CM | POA: Diagnosis not present

## 2016-06-30 DIAGNOSIS — D631 Anemia in chronic kidney disease: Secondary | ICD-10-CM | POA: Diagnosis not present

## 2016-07-02 DIAGNOSIS — D509 Iron deficiency anemia, unspecified: Secondary | ICD-10-CM | POA: Diagnosis not present

## 2016-07-02 DIAGNOSIS — N186 End stage renal disease: Secondary | ICD-10-CM | POA: Diagnosis not present

## 2016-07-02 DIAGNOSIS — D631 Anemia in chronic kidney disease: Secondary | ICD-10-CM | POA: Diagnosis not present

## 2016-07-14 DIAGNOSIS — T8610 Unspecified complication of kidney transplant: Secondary | ICD-10-CM | POA: Diagnosis not present

## 2016-07-15 DIAGNOSIS — A312 Disseminated mycobacterium avium-intracellulare complex (DMAC): Secondary | ICD-10-CM | POA: Diagnosis not present

## 2016-07-15 DIAGNOSIS — Z4822 Encounter for aftercare following kidney transplant: Secondary | ICD-10-CM | POA: Diagnosis not present

## 2016-07-15 DIAGNOSIS — D631 Anemia in chronic kidney disease: Secondary | ICD-10-CM | POA: Diagnosis not present

## 2016-07-15 DIAGNOSIS — Z79899 Other long term (current) drug therapy: Secondary | ICD-10-CM | POA: Diagnosis not present

## 2016-07-15 DIAGNOSIS — I120 Hypertensive chronic kidney disease with stage 5 chronic kidney disease or end stage renal disease: Secondary | ICD-10-CM | POA: Diagnosis not present

## 2016-07-15 DIAGNOSIS — Z888 Allergy status to other drugs, medicaments and biological substances status: Secondary | ICD-10-CM | POA: Diagnosis not present

## 2016-07-15 DIAGNOSIS — G510 Bell's palsy: Secondary | ICD-10-CM | POA: Diagnosis not present

## 2016-07-15 DIAGNOSIS — N186 End stage renal disease: Secondary | ICD-10-CM | POA: Diagnosis not present

## 2016-07-18 DIAGNOSIS — I120 Hypertensive chronic kidney disease with stage 5 chronic kidney disease or end stage renal disease: Secondary | ICD-10-CM | POA: Diagnosis not present

## 2016-07-18 DIAGNOSIS — Z79899 Other long term (current) drug therapy: Secondary | ICD-10-CM | POA: Diagnosis not present

## 2016-07-18 DIAGNOSIS — G513 Clonic hemifacial spasm: Secondary | ICD-10-CM | POA: Diagnosis not present

## 2016-07-18 DIAGNOSIS — R29818 Other symptoms and signs involving the nervous system: Secondary | ICD-10-CM | POA: Diagnosis not present

## 2016-07-18 DIAGNOSIS — G510 Bell's palsy: Secondary | ICD-10-CM | POA: Diagnosis not present

## 2016-07-18 DIAGNOSIS — R258 Other abnormal involuntary movements: Secondary | ICD-10-CM | POA: Diagnosis not present

## 2016-07-18 DIAGNOSIS — Z888 Allergy status to other drugs, medicaments and biological substances status: Secondary | ICD-10-CM | POA: Diagnosis not present

## 2016-07-18 DIAGNOSIS — D631 Anemia in chronic kidney disease: Secondary | ICD-10-CM | POA: Diagnosis not present

## 2016-07-18 DIAGNOSIS — N186 End stage renal disease: Secondary | ICD-10-CM | POA: Diagnosis not present

## 2016-07-21 DIAGNOSIS — Z992 Dependence on renal dialysis: Secondary | ICD-10-CM | POA: Diagnosis not present

## 2016-07-21 DIAGNOSIS — N186 End stage renal disease: Secondary | ICD-10-CM | POA: Diagnosis not present

## 2016-07-23 DIAGNOSIS — D631 Anemia in chronic kidney disease: Secondary | ICD-10-CM | POA: Diagnosis not present

## 2016-07-23 DIAGNOSIS — E8779 Other fluid overload: Secondary | ICD-10-CM | POA: Diagnosis not present

## 2016-07-23 DIAGNOSIS — D509 Iron deficiency anemia, unspecified: Secondary | ICD-10-CM | POA: Diagnosis not present

## 2016-07-23 DIAGNOSIS — N186 End stage renal disease: Secondary | ICD-10-CM | POA: Diagnosis not present

## 2016-07-26 DIAGNOSIS — D631 Anemia in chronic kidney disease: Secondary | ICD-10-CM | POA: Diagnosis not present

## 2016-07-26 DIAGNOSIS — D509 Iron deficiency anemia, unspecified: Secondary | ICD-10-CM | POA: Diagnosis not present

## 2016-07-26 DIAGNOSIS — N186 End stage renal disease: Secondary | ICD-10-CM | POA: Diagnosis not present

## 2016-07-26 DIAGNOSIS — E8779 Other fluid overload: Secondary | ICD-10-CM | POA: Diagnosis not present

## 2016-07-28 DIAGNOSIS — D509 Iron deficiency anemia, unspecified: Secondary | ICD-10-CM | POA: Diagnosis not present

## 2016-07-28 DIAGNOSIS — E8779 Other fluid overload: Secondary | ICD-10-CM | POA: Diagnosis not present

## 2016-07-28 DIAGNOSIS — N186 End stage renal disease: Secondary | ICD-10-CM | POA: Diagnosis not present

## 2016-07-28 DIAGNOSIS — D631 Anemia in chronic kidney disease: Secondary | ICD-10-CM | POA: Diagnosis not present

## 2016-07-30 DIAGNOSIS — D509 Iron deficiency anemia, unspecified: Secondary | ICD-10-CM | POA: Diagnosis not present

## 2016-07-30 DIAGNOSIS — D631 Anemia in chronic kidney disease: Secondary | ICD-10-CM | POA: Diagnosis not present

## 2016-07-30 DIAGNOSIS — N186 End stage renal disease: Secondary | ICD-10-CM | POA: Diagnosis not present

## 2016-07-30 DIAGNOSIS — E8779 Other fluid overload: Secondary | ICD-10-CM | POA: Diagnosis not present

## 2016-08-01 DIAGNOSIS — Z79899 Other long term (current) drug therapy: Secondary | ICD-10-CM | POA: Diagnosis not present

## 2016-08-01 DIAGNOSIS — Z4822 Encounter for aftercare following kidney transplant: Secondary | ICD-10-CM | POA: Diagnosis not present

## 2016-08-01 DIAGNOSIS — I120 Hypertensive chronic kidney disease with stage 5 chronic kidney disease or end stage renal disease: Secondary | ICD-10-CM | POA: Diagnosis not present

## 2016-08-01 DIAGNOSIS — Z992 Dependence on renal dialysis: Secondary | ICD-10-CM | POA: Diagnosis not present

## 2016-08-01 DIAGNOSIS — D631 Anemia in chronic kidney disease: Secondary | ICD-10-CM | POA: Diagnosis not present

## 2016-08-01 DIAGNOSIS — N186 End stage renal disease: Secondary | ICD-10-CM | POA: Diagnosis not present

## 2016-08-02 DIAGNOSIS — E8779 Other fluid overload: Secondary | ICD-10-CM | POA: Diagnosis not present

## 2016-08-02 DIAGNOSIS — D631 Anemia in chronic kidney disease: Secondary | ICD-10-CM | POA: Diagnosis not present

## 2016-08-02 DIAGNOSIS — D509 Iron deficiency anemia, unspecified: Secondary | ICD-10-CM | POA: Diagnosis not present

## 2016-08-02 DIAGNOSIS — N186 End stage renal disease: Secondary | ICD-10-CM | POA: Diagnosis not present

## 2016-08-04 DIAGNOSIS — D509 Iron deficiency anemia, unspecified: Secondary | ICD-10-CM | POA: Diagnosis not present

## 2016-08-04 DIAGNOSIS — N186 End stage renal disease: Secondary | ICD-10-CM | POA: Diagnosis not present

## 2016-08-04 DIAGNOSIS — D631 Anemia in chronic kidney disease: Secondary | ICD-10-CM | POA: Diagnosis not present

## 2016-08-04 DIAGNOSIS — E8779 Other fluid overload: Secondary | ICD-10-CM | POA: Diagnosis not present

## 2016-08-05 DIAGNOSIS — H527 Unspecified disorder of refraction: Secondary | ICD-10-CM | POA: Diagnosis not present

## 2016-08-05 DIAGNOSIS — Z79899 Other long term (current) drug therapy: Secondary | ICD-10-CM | POA: Diagnosis not present

## 2016-08-05 DIAGNOSIS — H31103 Choroidal degeneration, unspecified, bilateral: Secondary | ICD-10-CM | POA: Diagnosis not present

## 2016-08-05 DIAGNOSIS — G510 Bell's palsy: Secondary | ICD-10-CM | POA: Diagnosis not present

## 2016-08-06 DIAGNOSIS — D509 Iron deficiency anemia, unspecified: Secondary | ICD-10-CM | POA: Diagnosis not present

## 2016-08-06 DIAGNOSIS — N186 End stage renal disease: Secondary | ICD-10-CM | POA: Diagnosis not present

## 2016-08-06 DIAGNOSIS — E8779 Other fluid overload: Secondary | ICD-10-CM | POA: Diagnosis not present

## 2016-08-06 DIAGNOSIS — D631 Anemia in chronic kidney disease: Secondary | ICD-10-CM | POA: Diagnosis not present

## 2016-08-09 DIAGNOSIS — E8779 Other fluid overload: Secondary | ICD-10-CM | POA: Diagnosis not present

## 2016-08-09 DIAGNOSIS — N186 End stage renal disease: Secondary | ICD-10-CM | POA: Diagnosis not present

## 2016-08-09 DIAGNOSIS — D509 Iron deficiency anemia, unspecified: Secondary | ICD-10-CM | POA: Diagnosis not present

## 2016-08-09 DIAGNOSIS — D631 Anemia in chronic kidney disease: Secondary | ICD-10-CM | POA: Diagnosis not present

## 2016-08-11 DIAGNOSIS — R197 Diarrhea, unspecified: Secondary | ICD-10-CM | POA: Diagnosis not present

## 2016-08-11 DIAGNOSIS — D509 Iron deficiency anemia, unspecified: Secondary | ICD-10-CM | POA: Diagnosis not present

## 2016-08-11 DIAGNOSIS — N186 End stage renal disease: Secondary | ICD-10-CM | POA: Diagnosis not present

## 2016-08-11 DIAGNOSIS — D631 Anemia in chronic kidney disease: Secondary | ICD-10-CM | POA: Diagnosis not present

## 2016-08-11 DIAGNOSIS — K439 Ventral hernia without obstruction or gangrene: Secondary | ICD-10-CM | POA: Diagnosis not present

## 2016-08-11 DIAGNOSIS — E8779 Other fluid overload: Secondary | ICD-10-CM | POA: Diagnosis not present

## 2016-08-13 DIAGNOSIS — D509 Iron deficiency anemia, unspecified: Secondary | ICD-10-CM | POA: Diagnosis not present

## 2016-08-13 DIAGNOSIS — E8779 Other fluid overload: Secondary | ICD-10-CM | POA: Diagnosis not present

## 2016-08-13 DIAGNOSIS — D631 Anemia in chronic kidney disease: Secondary | ICD-10-CM | POA: Diagnosis not present

## 2016-08-13 DIAGNOSIS — N186 End stage renal disease: Secondary | ICD-10-CM | POA: Diagnosis not present

## 2016-08-16 DIAGNOSIS — N186 End stage renal disease: Secondary | ICD-10-CM | POA: Diagnosis not present

## 2016-08-16 DIAGNOSIS — D631 Anemia in chronic kidney disease: Secondary | ICD-10-CM | POA: Diagnosis not present

## 2016-08-16 DIAGNOSIS — E8779 Other fluid overload: Secondary | ICD-10-CM | POA: Diagnosis not present

## 2016-08-16 DIAGNOSIS — D509 Iron deficiency anemia, unspecified: Secondary | ICD-10-CM | POA: Diagnosis not present

## 2016-08-18 DIAGNOSIS — D509 Iron deficiency anemia, unspecified: Secondary | ICD-10-CM | POA: Diagnosis not present

## 2016-08-18 DIAGNOSIS — D631 Anemia in chronic kidney disease: Secondary | ICD-10-CM | POA: Diagnosis not present

## 2016-08-18 DIAGNOSIS — N186 End stage renal disease: Secondary | ICD-10-CM | POA: Diagnosis not present

## 2016-08-18 DIAGNOSIS — E8779 Other fluid overload: Secondary | ICD-10-CM | POA: Diagnosis not present

## 2016-08-19 DIAGNOSIS — E8779 Other fluid overload: Secondary | ICD-10-CM | POA: Diagnosis not present

## 2016-08-19 DIAGNOSIS — D631 Anemia in chronic kidney disease: Secondary | ICD-10-CM | POA: Diagnosis not present

## 2016-08-19 DIAGNOSIS — D509 Iron deficiency anemia, unspecified: Secondary | ICD-10-CM | POA: Diagnosis not present

## 2016-08-19 DIAGNOSIS — N186 End stage renal disease: Secondary | ICD-10-CM | POA: Diagnosis not present

## 2016-08-20 DIAGNOSIS — N186 End stage renal disease: Secondary | ICD-10-CM | POA: Diagnosis not present

## 2016-08-20 DIAGNOSIS — Z992 Dependence on renal dialysis: Secondary | ICD-10-CM | POA: Diagnosis not present

## 2016-08-22 DIAGNOSIS — N186 End stage renal disease: Secondary | ICD-10-CM | POA: Diagnosis not present

## 2016-08-22 DIAGNOSIS — D631 Anemia in chronic kidney disease: Secondary | ICD-10-CM | POA: Diagnosis not present

## 2016-08-22 DIAGNOSIS — N2581 Secondary hyperparathyroidism of renal origin: Secondary | ICD-10-CM | POA: Diagnosis not present

## 2016-08-25 DIAGNOSIS — N2581 Secondary hyperparathyroidism of renal origin: Secondary | ICD-10-CM | POA: Diagnosis not present

## 2016-08-25 DIAGNOSIS — N186 End stage renal disease: Secondary | ICD-10-CM | POA: Diagnosis not present

## 2016-08-25 DIAGNOSIS — G518 Other disorders of facial nerve: Secondary | ICD-10-CM | POA: Diagnosis not present

## 2016-08-25 DIAGNOSIS — D631 Anemia in chronic kidney disease: Secondary | ICD-10-CM | POA: Diagnosis not present

## 2016-08-25 DIAGNOSIS — G510 Bell's palsy: Secondary | ICD-10-CM | POA: Diagnosis not present

## 2016-08-25 DIAGNOSIS — G513 Clonic hemifacial spasm: Secondary | ICD-10-CM | POA: Diagnosis not present

## 2016-08-27 DIAGNOSIS — D631 Anemia in chronic kidney disease: Secondary | ICD-10-CM | POA: Diagnosis not present

## 2016-08-27 DIAGNOSIS — N2581 Secondary hyperparathyroidism of renal origin: Secondary | ICD-10-CM | POA: Diagnosis not present

## 2016-08-27 DIAGNOSIS — N186 End stage renal disease: Secondary | ICD-10-CM | POA: Diagnosis not present

## 2016-08-30 DIAGNOSIS — N186 End stage renal disease: Secondary | ICD-10-CM | POA: Diagnosis not present

## 2016-08-30 DIAGNOSIS — D631 Anemia in chronic kidney disease: Secondary | ICD-10-CM | POA: Diagnosis not present

## 2016-08-30 DIAGNOSIS — N2581 Secondary hyperparathyroidism of renal origin: Secondary | ICD-10-CM | POA: Diagnosis not present

## 2016-09-01 DIAGNOSIS — N2581 Secondary hyperparathyroidism of renal origin: Secondary | ICD-10-CM | POA: Diagnosis not present

## 2016-09-01 DIAGNOSIS — N186 End stage renal disease: Secondary | ICD-10-CM | POA: Diagnosis not present

## 2016-09-01 DIAGNOSIS — D631 Anemia in chronic kidney disease: Secondary | ICD-10-CM | POA: Diagnosis not present

## 2016-09-02 DIAGNOSIS — Z79899 Other long term (current) drug therapy: Secondary | ICD-10-CM | POA: Diagnosis not present

## 2016-09-02 DIAGNOSIS — R208 Other disturbances of skin sensation: Secondary | ICD-10-CM | POA: Diagnosis not present

## 2016-09-02 DIAGNOSIS — G629 Polyneuropathy, unspecified: Secondary | ICD-10-CM | POA: Diagnosis not present

## 2016-09-02 DIAGNOSIS — N186 End stage renal disease: Secondary | ICD-10-CM | POA: Diagnosis not present

## 2016-09-02 DIAGNOSIS — M792 Neuralgia and neuritis, unspecified: Secondary | ICD-10-CM | POA: Diagnosis not present

## 2016-09-02 DIAGNOSIS — G510 Bell's palsy: Secondary | ICD-10-CM | POA: Diagnosis not present

## 2016-09-02 DIAGNOSIS — Z23 Encounter for immunization: Secondary | ICD-10-CM | POA: Diagnosis not present

## 2016-09-02 DIAGNOSIS — G513 Clonic hemifacial spasm: Secondary | ICD-10-CM | POA: Diagnosis not present

## 2016-09-02 DIAGNOSIS — I120 Hypertensive chronic kidney disease with stage 5 chronic kidney disease or end stage renal disease: Secondary | ICD-10-CM | POA: Diagnosis not present

## 2016-09-02 DIAGNOSIS — Z888 Allergy status to other drugs, medicaments and biological substances status: Secondary | ICD-10-CM | POA: Diagnosis not present

## 2016-09-02 DIAGNOSIS — Z940 Kidney transplant status: Secondary | ICD-10-CM | POA: Diagnosis not present

## 2016-09-03 DIAGNOSIS — N186 End stage renal disease: Secondary | ICD-10-CM | POA: Diagnosis not present

## 2016-09-03 DIAGNOSIS — D631 Anemia in chronic kidney disease: Secondary | ICD-10-CM | POA: Diagnosis not present

## 2016-09-03 DIAGNOSIS — N2581 Secondary hyperparathyroidism of renal origin: Secondary | ICD-10-CM | POA: Diagnosis not present

## 2016-09-05 DIAGNOSIS — I110 Hypertensive heart disease with heart failure: Secondary | ICD-10-CM | POA: Diagnosis not present

## 2016-09-05 DIAGNOSIS — Z992 Dependence on renal dialysis: Secondary | ICD-10-CM | POA: Diagnosis not present

## 2016-09-05 DIAGNOSIS — Z888 Allergy status to other drugs, medicaments and biological substances status: Secondary | ICD-10-CM | POA: Diagnosis not present

## 2016-09-05 DIAGNOSIS — H527 Unspecified disorder of refraction: Secondary | ICD-10-CM | POA: Diagnosis not present

## 2016-09-05 DIAGNOSIS — D631 Anemia in chronic kidney disease: Secondary | ICD-10-CM | POA: Diagnosis not present

## 2016-09-05 DIAGNOSIS — Z882 Allergy status to sulfonamides status: Secondary | ICD-10-CM | POA: Diagnosis not present

## 2016-09-05 DIAGNOSIS — Z79899 Other long term (current) drug therapy: Secondary | ICD-10-CM | POA: Diagnosis not present

## 2016-09-05 DIAGNOSIS — G510 Bell's palsy: Secondary | ICD-10-CM | POA: Diagnosis not present

## 2016-09-05 DIAGNOSIS — N186 End stage renal disease: Secondary | ICD-10-CM | POA: Diagnosis not present

## 2016-09-06 DIAGNOSIS — N186 End stage renal disease: Secondary | ICD-10-CM | POA: Diagnosis not present

## 2016-09-06 DIAGNOSIS — N2581 Secondary hyperparathyroidism of renal origin: Secondary | ICD-10-CM | POA: Diagnosis not present

## 2016-09-06 DIAGNOSIS — D631 Anemia in chronic kidney disease: Secondary | ICD-10-CM | POA: Diagnosis not present

## 2016-09-08 DIAGNOSIS — N186 End stage renal disease: Secondary | ICD-10-CM | POA: Diagnosis not present

## 2016-09-08 DIAGNOSIS — N2581 Secondary hyperparathyroidism of renal origin: Secondary | ICD-10-CM | POA: Diagnosis not present

## 2016-09-08 DIAGNOSIS — D631 Anemia in chronic kidney disease: Secondary | ICD-10-CM | POA: Diagnosis not present

## 2016-09-08 DIAGNOSIS — G510 Bell's palsy: Secondary | ICD-10-CM | POA: Diagnosis not present

## 2016-09-10 DIAGNOSIS — N186 End stage renal disease: Secondary | ICD-10-CM | POA: Diagnosis not present

## 2016-09-10 DIAGNOSIS — N2581 Secondary hyperparathyroidism of renal origin: Secondary | ICD-10-CM | POA: Diagnosis not present

## 2016-09-10 DIAGNOSIS — D631 Anemia in chronic kidney disease: Secondary | ICD-10-CM | POA: Diagnosis not present

## 2016-09-13 DIAGNOSIS — D631 Anemia in chronic kidney disease: Secondary | ICD-10-CM | POA: Diagnosis not present

## 2016-09-13 DIAGNOSIS — N2581 Secondary hyperparathyroidism of renal origin: Secondary | ICD-10-CM | POA: Diagnosis not present

## 2016-09-13 DIAGNOSIS — N186 End stage renal disease: Secondary | ICD-10-CM | POA: Diagnosis not present

## 2016-09-14 DIAGNOSIS — G6289 Other specified polyneuropathies: Secondary | ICD-10-CM | POA: Diagnosis not present

## 2016-09-15 DIAGNOSIS — N2581 Secondary hyperparathyroidism of renal origin: Secondary | ICD-10-CM | POA: Diagnosis not present

## 2016-09-15 DIAGNOSIS — Z9889 Other specified postprocedural states: Secondary | ICD-10-CM | POA: Diagnosis not present

## 2016-09-15 DIAGNOSIS — D631 Anemia in chronic kidney disease: Secondary | ICD-10-CM | POA: Diagnosis not present

## 2016-09-15 DIAGNOSIS — K439 Ventral hernia without obstruction or gangrene: Secondary | ICD-10-CM | POA: Diagnosis not present

## 2016-09-15 DIAGNOSIS — R197 Diarrhea, unspecified: Secondary | ICD-10-CM | POA: Diagnosis not present

## 2016-09-15 DIAGNOSIS — N186 End stage renal disease: Secondary | ICD-10-CM | POA: Diagnosis not present

## 2016-09-17 DIAGNOSIS — N186 End stage renal disease: Secondary | ICD-10-CM | POA: Diagnosis not present

## 2016-09-17 DIAGNOSIS — N2581 Secondary hyperparathyroidism of renal origin: Secondary | ICD-10-CM | POA: Diagnosis not present

## 2016-09-17 DIAGNOSIS — D631 Anemia in chronic kidney disease: Secondary | ICD-10-CM | POA: Diagnosis not present

## 2016-09-19 DIAGNOSIS — I120 Hypertensive chronic kidney disease with stage 5 chronic kidney disease or end stage renal disease: Secondary | ICD-10-CM | POA: Diagnosis not present

## 2016-09-19 DIAGNOSIS — K219 Gastro-esophageal reflux disease without esophagitis: Secondary | ICD-10-CM | POA: Diagnosis not present

## 2016-09-19 DIAGNOSIS — Z940 Kidney transplant status: Secondary | ICD-10-CM | POA: Diagnosis not present

## 2016-09-19 DIAGNOSIS — N186 End stage renal disease: Secondary | ICD-10-CM | POA: Diagnosis not present

## 2016-09-19 DIAGNOSIS — D631 Anemia in chronic kidney disease: Secondary | ICD-10-CM | POA: Diagnosis not present

## 2016-09-19 DIAGNOSIS — G510 Bell's palsy: Secondary | ICD-10-CM | POA: Diagnosis not present

## 2016-09-19 DIAGNOSIS — Z79899 Other long term (current) drug therapy: Secondary | ICD-10-CM | POA: Diagnosis not present

## 2016-09-19 DIAGNOSIS — R197 Diarrhea, unspecified: Secondary | ICD-10-CM | POA: Diagnosis not present

## 2016-09-19 DIAGNOSIS — G513 Clonic hemifacial spasm: Secondary | ICD-10-CM | POA: Diagnosis not present

## 2016-09-19 DIAGNOSIS — K439 Ventral hernia without obstruction or gangrene: Secondary | ICD-10-CM | POA: Diagnosis not present

## 2016-09-19 DIAGNOSIS — Z888 Allergy status to other drugs, medicaments and biological substances status: Secondary | ICD-10-CM | POA: Diagnosis not present

## 2016-09-19 DIAGNOSIS — M069 Rheumatoid arthritis, unspecified: Secondary | ICD-10-CM | POA: Diagnosis not present

## 2016-09-19 DIAGNOSIS — Z7952 Long term (current) use of systemic steroids: Secondary | ICD-10-CM | POA: Diagnosis not present

## 2016-09-19 DIAGNOSIS — A312 Disseminated mycobacterium avium-intracellulare complex (DMAC): Secondary | ICD-10-CM | POA: Diagnosis not present

## 2016-09-19 DIAGNOSIS — E639 Nutritional deficiency, unspecified: Secondary | ICD-10-CM | POA: Diagnosis not present

## 2016-09-20 DIAGNOSIS — N186 End stage renal disease: Secondary | ICD-10-CM | POA: Diagnosis not present

## 2016-09-20 DIAGNOSIS — D631 Anemia in chronic kidney disease: Secondary | ICD-10-CM | POA: Diagnosis not present

## 2016-09-20 DIAGNOSIS — N2581 Secondary hyperparathyroidism of renal origin: Secondary | ICD-10-CM | POA: Diagnosis not present

## 2016-09-20 DIAGNOSIS — Z992 Dependence on renal dialysis: Secondary | ICD-10-CM | POA: Diagnosis not present

## 2016-09-22 DIAGNOSIS — N186 End stage renal disease: Secondary | ICD-10-CM | POA: Diagnosis not present

## 2016-09-22 DIAGNOSIS — Z23 Encounter for immunization: Secondary | ICD-10-CM | POA: Diagnosis not present

## 2016-09-22 DIAGNOSIS — D631 Anemia in chronic kidney disease: Secondary | ICD-10-CM | POA: Diagnosis not present

## 2016-09-22 DIAGNOSIS — N2581 Secondary hyperparathyroidism of renal origin: Secondary | ICD-10-CM | POA: Diagnosis not present

## 2016-09-23 ENCOUNTER — Other Ambulatory Visit: Payer: Self-pay | Admitting: Emergency Medicine

## 2016-09-23 DIAGNOSIS — Z1231 Encounter for screening mammogram for malignant neoplasm of breast: Secondary | ICD-10-CM

## 2016-09-24 DIAGNOSIS — N2581 Secondary hyperparathyroidism of renal origin: Secondary | ICD-10-CM | POA: Diagnosis not present

## 2016-09-24 DIAGNOSIS — N186 End stage renal disease: Secondary | ICD-10-CM | POA: Diagnosis not present

## 2016-09-24 DIAGNOSIS — Z23 Encounter for immunization: Secondary | ICD-10-CM | POA: Diagnosis not present

## 2016-09-24 DIAGNOSIS — D631 Anemia in chronic kidney disease: Secondary | ICD-10-CM | POA: Diagnosis not present

## 2016-09-27 DIAGNOSIS — Z23 Encounter for immunization: Secondary | ICD-10-CM | POA: Diagnosis not present

## 2016-09-27 DIAGNOSIS — N186 End stage renal disease: Secondary | ICD-10-CM | POA: Diagnosis not present

## 2016-09-27 DIAGNOSIS — N2581 Secondary hyperparathyroidism of renal origin: Secondary | ICD-10-CM | POA: Diagnosis not present

## 2016-09-27 DIAGNOSIS — D631 Anemia in chronic kidney disease: Secondary | ICD-10-CM | POA: Diagnosis not present

## 2016-09-29 DIAGNOSIS — N2581 Secondary hyperparathyroidism of renal origin: Secondary | ICD-10-CM | POA: Diagnosis not present

## 2016-09-29 DIAGNOSIS — K219 Gastro-esophageal reflux disease without esophagitis: Secondary | ICD-10-CM | POA: Diagnosis not present

## 2016-09-29 DIAGNOSIS — M069 Rheumatoid arthritis, unspecified: Secondary | ICD-10-CM | POA: Diagnosis not present

## 2016-09-29 DIAGNOSIS — E1122 Type 2 diabetes mellitus with diabetic chronic kidney disease: Secondary | ICD-10-CM | POA: Diagnosis not present

## 2016-09-29 DIAGNOSIS — H539 Unspecified visual disturbance: Secondary | ICD-10-CM | POA: Diagnosis not present

## 2016-09-29 DIAGNOSIS — I120 Hypertensive chronic kidney disease with stage 5 chronic kidney disease or end stage renal disease: Secondary | ICD-10-CM | POA: Diagnosis not present

## 2016-09-29 DIAGNOSIS — Z992 Dependence on renal dialysis: Secondary | ICD-10-CM | POA: Diagnosis not present

## 2016-09-29 DIAGNOSIS — D631 Anemia in chronic kidney disease: Secondary | ICD-10-CM | POA: Diagnosis not present

## 2016-09-29 DIAGNOSIS — G510 Bell's palsy: Secondary | ICD-10-CM | POA: Diagnosis not present

## 2016-09-29 DIAGNOSIS — N186 End stage renal disease: Secondary | ICD-10-CM | POA: Diagnosis not present

## 2016-09-29 DIAGNOSIS — H527 Unspecified disorder of refraction: Secondary | ICD-10-CM | POA: Diagnosis not present

## 2016-09-29 DIAGNOSIS — Z23 Encounter for immunization: Secondary | ICD-10-CM | POA: Diagnosis not present

## 2016-09-29 DIAGNOSIS — H35363 Drusen (degenerative) of macula, bilateral: Secondary | ICD-10-CM | POA: Diagnosis not present

## 2016-09-29 DIAGNOSIS — H538 Other visual disturbances: Secondary | ICD-10-CM | POA: Diagnosis not present

## 2016-09-30 DIAGNOSIS — G510 Bell's palsy: Secondary | ICD-10-CM | POA: Diagnosis not present

## 2016-09-30 DIAGNOSIS — H04123 Dry eye syndrome of bilateral lacrimal glands: Secondary | ICD-10-CM | POA: Diagnosis not present

## 2016-09-30 DIAGNOSIS — H35363 Drusen (degenerative) of macula, bilateral: Secondary | ICD-10-CM | POA: Diagnosis not present

## 2016-09-30 DIAGNOSIS — Z79899 Other long term (current) drug therapy: Secondary | ICD-10-CM | POA: Diagnosis not present

## 2016-09-30 DIAGNOSIS — H527 Unspecified disorder of refraction: Secondary | ICD-10-CM | POA: Diagnosis not present

## 2016-10-01 DIAGNOSIS — N2581 Secondary hyperparathyroidism of renal origin: Secondary | ICD-10-CM | POA: Diagnosis not present

## 2016-10-01 DIAGNOSIS — Z23 Encounter for immunization: Secondary | ICD-10-CM | POA: Diagnosis not present

## 2016-10-01 DIAGNOSIS — N186 End stage renal disease: Secondary | ICD-10-CM | POA: Diagnosis not present

## 2016-10-01 DIAGNOSIS — D631 Anemia in chronic kidney disease: Secondary | ICD-10-CM | POA: Diagnosis not present

## 2016-10-03 DIAGNOSIS — R221 Localized swelling, mass and lump, neck: Secondary | ICD-10-CM | POA: Diagnosis not present

## 2016-10-03 DIAGNOSIS — R202 Paresthesia of skin: Secondary | ICD-10-CM | POA: Diagnosis not present

## 2016-10-03 DIAGNOSIS — Z7952 Long term (current) use of systemic steroids: Secondary | ICD-10-CM | POA: Diagnosis not present

## 2016-10-03 DIAGNOSIS — A312 Disseminated mycobacterium avium-intracellulare complex (DMAC): Secondary | ICD-10-CM | POA: Diagnosis not present

## 2016-10-04 DIAGNOSIS — N2581 Secondary hyperparathyroidism of renal origin: Secondary | ICD-10-CM | POA: Diagnosis not present

## 2016-10-04 DIAGNOSIS — D631 Anemia in chronic kidney disease: Secondary | ICD-10-CM | POA: Diagnosis not present

## 2016-10-04 DIAGNOSIS — N186 End stage renal disease: Secondary | ICD-10-CM | POA: Diagnosis not present

## 2016-10-04 DIAGNOSIS — Z23 Encounter for immunization: Secondary | ICD-10-CM | POA: Diagnosis not present

## 2016-10-06 DIAGNOSIS — N186 End stage renal disease: Secondary | ICD-10-CM | POA: Diagnosis not present

## 2016-10-06 DIAGNOSIS — D631 Anemia in chronic kidney disease: Secondary | ICD-10-CM | POA: Diagnosis not present

## 2016-10-06 DIAGNOSIS — Z23 Encounter for immunization: Secondary | ICD-10-CM | POA: Diagnosis not present

## 2016-10-06 DIAGNOSIS — N2581 Secondary hyperparathyroidism of renal origin: Secondary | ICD-10-CM | POA: Diagnosis not present

## 2016-10-07 DIAGNOSIS — N186 End stage renal disease: Secondary | ICD-10-CM | POA: Diagnosis not present

## 2016-10-07 DIAGNOSIS — H527 Unspecified disorder of refraction: Secondary | ICD-10-CM | POA: Diagnosis not present

## 2016-10-07 DIAGNOSIS — Z888 Allergy status to other drugs, medicaments and biological substances status: Secondary | ICD-10-CM | POA: Diagnosis not present

## 2016-10-07 DIAGNOSIS — H01024 Squamous blepharitis left upper eyelid: Secondary | ICD-10-CM | POA: Diagnosis not present

## 2016-10-07 DIAGNOSIS — H01021 Squamous blepharitis right upper eyelid: Secondary | ICD-10-CM | POA: Diagnosis not present

## 2016-10-07 DIAGNOSIS — H04123 Dry eye syndrome of bilateral lacrimal glands: Secondary | ICD-10-CM | POA: Diagnosis not present

## 2016-10-07 DIAGNOSIS — Z940 Kidney transplant status: Secondary | ICD-10-CM | POA: Diagnosis not present

## 2016-10-07 DIAGNOSIS — Z8739 Personal history of other diseases of the musculoskeletal system and connective tissue: Secondary | ICD-10-CM | POA: Diagnosis not present

## 2016-10-07 DIAGNOSIS — Z79899 Other long term (current) drug therapy: Secondary | ICD-10-CM | POA: Diagnosis not present

## 2016-10-07 DIAGNOSIS — I120 Hypertensive chronic kidney disease with stage 5 chronic kidney disease or end stage renal disease: Secondary | ICD-10-CM | POA: Diagnosis not present

## 2016-10-08 DIAGNOSIS — N186 End stage renal disease: Secondary | ICD-10-CM | POA: Diagnosis not present

## 2016-10-08 DIAGNOSIS — D631 Anemia in chronic kidney disease: Secondary | ICD-10-CM | POA: Diagnosis not present

## 2016-10-08 DIAGNOSIS — Z23 Encounter for immunization: Secondary | ICD-10-CM | POA: Diagnosis not present

## 2016-10-08 DIAGNOSIS — N2581 Secondary hyperparathyroidism of renal origin: Secondary | ICD-10-CM | POA: Diagnosis not present

## 2016-10-10 DIAGNOSIS — Z23 Encounter for immunization: Secondary | ICD-10-CM | POA: Diagnosis not present

## 2016-10-10 DIAGNOSIS — N186 End stage renal disease: Secondary | ICD-10-CM | POA: Diagnosis not present

## 2016-10-10 DIAGNOSIS — D631 Anemia in chronic kidney disease: Secondary | ICD-10-CM | POA: Diagnosis not present

## 2016-10-10 DIAGNOSIS — N2581 Secondary hyperparathyroidism of renal origin: Secondary | ICD-10-CM | POA: Diagnosis not present

## 2016-10-12 DIAGNOSIS — Z23 Encounter for immunization: Secondary | ICD-10-CM | POA: Diagnosis not present

## 2016-10-12 DIAGNOSIS — D631 Anemia in chronic kidney disease: Secondary | ICD-10-CM | POA: Diagnosis not present

## 2016-10-12 DIAGNOSIS — N2581 Secondary hyperparathyroidism of renal origin: Secondary | ICD-10-CM | POA: Diagnosis not present

## 2016-10-12 DIAGNOSIS — N186 End stage renal disease: Secondary | ICD-10-CM | POA: Diagnosis not present

## 2016-10-14 DIAGNOSIS — H01003 Unspecified blepharitis right eye, unspecified eyelid: Secondary | ICD-10-CM | POA: Diagnosis not present

## 2016-10-14 DIAGNOSIS — H3589 Other specified retinal disorders: Secondary | ICD-10-CM | POA: Diagnosis not present

## 2016-10-14 DIAGNOSIS — H04123 Dry eye syndrome of bilateral lacrimal glands: Secondary | ICD-10-CM | POA: Diagnosis not present

## 2016-10-14 DIAGNOSIS — G510 Bell's palsy: Secondary | ICD-10-CM | POA: Diagnosis not present

## 2016-10-14 DIAGNOSIS — H01006 Unspecified blepharitis left eye, unspecified eyelid: Secondary | ICD-10-CM | POA: Diagnosis not present

## 2016-10-14 DIAGNOSIS — T371X5D Adverse effect of antimycobacterial drugs, subsequent encounter: Secondary | ICD-10-CM | POA: Diagnosis not present

## 2016-10-15 DIAGNOSIS — N2581 Secondary hyperparathyroidism of renal origin: Secondary | ICD-10-CM | POA: Diagnosis not present

## 2016-10-15 DIAGNOSIS — N186 End stage renal disease: Secondary | ICD-10-CM | POA: Diagnosis not present

## 2016-10-15 DIAGNOSIS — D631 Anemia in chronic kidney disease: Secondary | ICD-10-CM | POA: Diagnosis not present

## 2016-10-15 DIAGNOSIS — Z23 Encounter for immunization: Secondary | ICD-10-CM | POA: Diagnosis not present

## 2016-10-18 DIAGNOSIS — D631 Anemia in chronic kidney disease: Secondary | ICD-10-CM | POA: Diagnosis not present

## 2016-10-18 DIAGNOSIS — N2581 Secondary hyperparathyroidism of renal origin: Secondary | ICD-10-CM | POA: Diagnosis not present

## 2016-10-18 DIAGNOSIS — Z23 Encounter for immunization: Secondary | ICD-10-CM | POA: Diagnosis not present

## 2016-10-18 DIAGNOSIS — N186 End stage renal disease: Secondary | ICD-10-CM | POA: Diagnosis not present

## 2016-10-20 DIAGNOSIS — N2581 Secondary hyperparathyroidism of renal origin: Secondary | ICD-10-CM | POA: Diagnosis not present

## 2016-10-20 DIAGNOSIS — Z992 Dependence on renal dialysis: Secondary | ICD-10-CM | POA: Diagnosis not present

## 2016-10-20 DIAGNOSIS — N186 End stage renal disease: Secondary | ICD-10-CM | POA: Diagnosis not present

## 2016-10-20 DIAGNOSIS — Z23 Encounter for immunization: Secondary | ICD-10-CM | POA: Diagnosis not present

## 2016-10-20 DIAGNOSIS — D631 Anemia in chronic kidney disease: Secondary | ICD-10-CM | POA: Diagnosis not present

## 2016-10-21 DIAGNOSIS — H04123 Dry eye syndrome of bilateral lacrimal glands: Secondary | ICD-10-CM | POA: Diagnosis not present

## 2016-10-21 DIAGNOSIS — T485X5D Adverse effect of other anti-common-cold drugs, subsequent encounter: Secondary | ICD-10-CM | POA: Diagnosis not present

## 2016-10-21 DIAGNOSIS — H538 Other visual disturbances: Secondary | ICD-10-CM | POA: Diagnosis not present

## 2016-10-21 DIAGNOSIS — T371X5D Adverse effect of antimycobacterial drugs, subsequent encounter: Secondary | ICD-10-CM | POA: Diagnosis not present

## 2016-10-22 DIAGNOSIS — D631 Anemia in chronic kidney disease: Secondary | ICD-10-CM | POA: Diagnosis not present

## 2016-10-22 DIAGNOSIS — D509 Iron deficiency anemia, unspecified: Secondary | ICD-10-CM | POA: Diagnosis not present

## 2016-10-22 DIAGNOSIS — N186 End stage renal disease: Secondary | ICD-10-CM | POA: Diagnosis not present

## 2016-10-25 DIAGNOSIS — N186 End stage renal disease: Secondary | ICD-10-CM | POA: Diagnosis not present

## 2016-10-25 DIAGNOSIS — D509 Iron deficiency anemia, unspecified: Secondary | ICD-10-CM | POA: Diagnosis not present

## 2016-10-25 DIAGNOSIS — D631 Anemia in chronic kidney disease: Secondary | ICD-10-CM | POA: Diagnosis not present

## 2016-10-27 DIAGNOSIS — N186 End stage renal disease: Secondary | ICD-10-CM | POA: Diagnosis not present

## 2016-10-27 DIAGNOSIS — D509 Iron deficiency anemia, unspecified: Secondary | ICD-10-CM | POA: Diagnosis not present

## 2016-10-27 DIAGNOSIS — D631 Anemia in chronic kidney disease: Secondary | ICD-10-CM | POA: Diagnosis not present

## 2016-10-28 ENCOUNTER — Ambulatory Visit: Payer: Medicare Other

## 2016-10-28 DIAGNOSIS — D631 Anemia in chronic kidney disease: Secondary | ICD-10-CM | POA: Diagnosis not present

## 2016-10-28 DIAGNOSIS — Z940 Kidney transplant status: Secondary | ICD-10-CM | POA: Diagnosis not present

## 2016-10-28 DIAGNOSIS — R221 Localized swelling, mass and lump, neck: Secondary | ICD-10-CM | POA: Diagnosis not present

## 2016-10-28 DIAGNOSIS — T371X5S Adverse effect of antimycobacterial drugs, sequela: Secondary | ICD-10-CM | POA: Diagnosis not present

## 2016-10-28 DIAGNOSIS — A312 Disseminated mycobacterium avium-intracellulare complex (DMAC): Secondary | ICD-10-CM | POA: Diagnosis not present

## 2016-10-28 DIAGNOSIS — Z79899 Other long term (current) drug therapy: Secondary | ICD-10-CM | POA: Diagnosis not present

## 2016-10-28 DIAGNOSIS — Z888 Allergy status to other drugs, medicaments and biological substances status: Secondary | ICD-10-CM | POA: Diagnosis not present

## 2016-10-28 DIAGNOSIS — N186 End stage renal disease: Secondary | ICD-10-CM | POA: Diagnosis not present

## 2016-10-28 DIAGNOSIS — D899 Disorder involving the immune mechanism, unspecified: Secondary | ICD-10-CM | POA: Diagnosis not present

## 2016-10-28 DIAGNOSIS — Z7952 Long term (current) use of systemic steroids: Secondary | ICD-10-CM | POA: Diagnosis not present

## 2016-10-28 DIAGNOSIS — I120 Hypertensive chronic kidney disease with stage 5 chronic kidney disease or end stage renal disease: Secondary | ICD-10-CM | POA: Diagnosis not present

## 2016-10-29 DIAGNOSIS — D631 Anemia in chronic kidney disease: Secondary | ICD-10-CM | POA: Diagnosis not present

## 2016-10-29 DIAGNOSIS — N186 End stage renal disease: Secondary | ICD-10-CM | POA: Diagnosis not present

## 2016-10-29 DIAGNOSIS — D509 Iron deficiency anemia, unspecified: Secondary | ICD-10-CM | POA: Diagnosis not present

## 2016-11-01 DIAGNOSIS — D631 Anemia in chronic kidney disease: Secondary | ICD-10-CM | POA: Diagnosis not present

## 2016-11-01 DIAGNOSIS — N186 End stage renal disease: Secondary | ICD-10-CM | POA: Diagnosis not present

## 2016-11-01 DIAGNOSIS — D509 Iron deficiency anemia, unspecified: Secondary | ICD-10-CM | POA: Diagnosis not present

## 2016-11-02 ENCOUNTER — Ambulatory Visit
Admission: RE | Admit: 2016-11-02 | Discharge: 2016-11-02 | Disposition: A | Discharge status: Home / Self Care | Payer: Medicare Other | Source: Ambulatory Visit | Attending: Emergency Medicine | Admitting: Emergency Medicine

## 2016-11-02 DIAGNOSIS — Z1231 Encounter for screening mammogram for malignant neoplasm of breast: Secondary | ICD-10-CM

## 2016-11-03 DIAGNOSIS — D631 Anemia in chronic kidney disease: Secondary | ICD-10-CM | POA: Diagnosis not present

## 2016-11-03 DIAGNOSIS — N186 End stage renal disease: Secondary | ICD-10-CM | POA: Diagnosis not present

## 2016-11-03 DIAGNOSIS — D509 Iron deficiency anemia, unspecified: Secondary | ICD-10-CM | POA: Diagnosis not present

## 2016-11-05 DIAGNOSIS — N186 End stage renal disease: Secondary | ICD-10-CM | POA: Diagnosis not present

## 2016-11-05 DIAGNOSIS — D509 Iron deficiency anemia, unspecified: Secondary | ICD-10-CM | POA: Diagnosis not present

## 2016-11-05 DIAGNOSIS — D631 Anemia in chronic kidney disease: Secondary | ICD-10-CM | POA: Diagnosis not present

## 2016-11-07 DIAGNOSIS — D509 Iron deficiency anemia, unspecified: Secondary | ICD-10-CM | POA: Diagnosis not present

## 2016-11-07 DIAGNOSIS — D631 Anemia in chronic kidney disease: Secondary | ICD-10-CM | POA: Diagnosis not present

## 2016-11-07 DIAGNOSIS — R221 Localized swelling, mass and lump, neck: Secondary | ICD-10-CM | POA: Diagnosis not present

## 2016-11-07 DIAGNOSIS — A312 Disseminated mycobacterium avium-intracellulare complex (DMAC): Secondary | ICD-10-CM | POA: Diagnosis not present

## 2016-11-07 DIAGNOSIS — N186 End stage renal disease: Secondary | ICD-10-CM | POA: Diagnosis not present

## 2016-11-08 DIAGNOSIS — L0211 Cutaneous abscess of neck: Secondary | ICD-10-CM | POA: Diagnosis not present

## 2016-11-08 DIAGNOSIS — Z940 Kidney transplant status: Secondary | ICD-10-CM | POA: Diagnosis not present

## 2016-11-08 DIAGNOSIS — A4152 Sepsis due to Pseudomonas: Secondary | ICD-10-CM | POA: Diagnosis not present

## 2016-11-08 DIAGNOSIS — I469 Cardiac arrest, cause unspecified: Secondary | ICD-10-CM | POA: Diagnosis not present

## 2016-11-08 DIAGNOSIS — J151 Pneumonia due to Pseudomonas: Secondary | ICD-10-CM | POA: Diagnosis not present

## 2016-11-08 DIAGNOSIS — L040 Acute lymphadenitis of face, head and neck: Secondary | ICD-10-CM | POA: Diagnosis not present

## 2016-11-08 DIAGNOSIS — R590 Localized enlarged lymph nodes: Secondary | ICD-10-CM | POA: Diagnosis not present

## 2016-11-08 DIAGNOSIS — T1490XA Injury, unspecified, initial encounter: Secondary | ICD-10-CM | POA: Diagnosis not present

## 2016-11-08 DIAGNOSIS — L7632 Postprocedural hematoma of skin and subcutaneous tissue following other procedure: Secondary | ICD-10-CM | POA: Diagnosis not present

## 2016-11-08 DIAGNOSIS — G931 Anoxic brain damage, not elsewhere classified: Secondary | ICD-10-CM | POA: Diagnosis not present

## 2016-11-08 DIAGNOSIS — R221 Localized swelling, mass and lump, neck: Secondary | ICD-10-CM | POA: Diagnosis not present

## 2016-11-08 DIAGNOSIS — I468 Cardiac arrest due to other underlying condition: Secondary | ICD-10-CM | POA: Diagnosis not present

## 2016-11-08 DIAGNOSIS — Z930 Tracheostomy status: Secondary | ICD-10-CM | POA: Diagnosis not present

## 2016-11-08 DIAGNOSIS — A312 Disseminated mycobacterium avium-intracellulare complex (DMAC): Secondary | ICD-10-CM | POA: Diagnosis not present

## 2016-11-08 DIAGNOSIS — R092 Respiratory arrest: Secondary | ICD-10-CM | POA: Diagnosis not present

## 2016-11-08 DIAGNOSIS — J95851 Ventilator associated pneumonia: Secondary | ICD-10-CM | POA: Diagnosis not present

## 2016-11-09 DIAGNOSIS — J810 Acute pulmonary edema: Secondary | ICD-10-CM | POA: Diagnosis not present

## 2016-11-09 DIAGNOSIS — R52 Pain, unspecified: Secondary | ICD-10-CM | POA: Diagnosis not present

## 2016-11-09 DIAGNOSIS — R0682 Tachypnea, not elsewhere classified: Secondary | ICD-10-CM | POA: Diagnosis not present

## 2016-11-09 DIAGNOSIS — E8809 Other disorders of plasma-protein metabolism, not elsewhere classified: Secondary | ICD-10-CM | POA: Diagnosis not present

## 2016-11-09 DIAGNOSIS — G931 Anoxic brain damage, not elsewhere classified: Secondary | ICD-10-CM | POA: Diagnosis not present

## 2016-11-09 DIAGNOSIS — B965 Pseudomonas (aeruginosa) (mallei) (pseudomallei) as the cause of diseases classified elsewhere: Secondary | ICD-10-CM | POA: Diagnosis not present

## 2016-11-09 DIAGNOSIS — A312 Disseminated mycobacterium avium-intracellulare complex (DMAC): Secondary | ICD-10-CM | POA: Diagnosis not present

## 2016-11-09 DIAGNOSIS — Z992 Dependence on renal dialysis: Secondary | ICD-10-CM | POA: Diagnosis not present

## 2016-11-09 DIAGNOSIS — T8611 Kidney transplant rejection: Secondary | ICD-10-CM | POA: Diagnosis not present

## 2016-11-09 DIAGNOSIS — M1990 Unspecified osteoarthritis, unspecified site: Secondary | ICD-10-CM | POA: Diagnosis present

## 2016-11-09 DIAGNOSIS — I120 Hypertensive chronic kidney disease with stage 5 chronic kidney disease or end stage renal disease: Secondary | ICD-10-CM | POA: Diagnosis not present

## 2016-11-09 DIAGNOSIS — I469 Cardiac arrest, cause unspecified: Secondary | ICD-10-CM | POA: Diagnosis not present

## 2016-11-09 DIAGNOSIS — D72829 Elevated white blood cell count, unspecified: Secondary | ICD-10-CM | POA: Diagnosis not present

## 2016-11-09 DIAGNOSIS — R6510 Systemic inflammatory response syndrome (SIRS) of non-infectious origin without acute organ dysfunction: Secondary | ICD-10-CM | POA: Diagnosis not present

## 2016-11-09 DIAGNOSIS — I82411 Acute embolism and thrombosis of right femoral vein: Secondary | ICD-10-CM | POA: Diagnosis not present

## 2016-11-09 DIAGNOSIS — R001 Bradycardia, unspecified: Secondary | ICD-10-CM | POA: Diagnosis not present

## 2016-11-09 DIAGNOSIS — R591 Generalized enlarged lymph nodes: Secondary | ICD-10-CM | POA: Diagnosis not present

## 2016-11-09 DIAGNOSIS — R918 Other nonspecific abnormal finding of lung field: Secondary | ICD-10-CM | POA: Diagnosis not present

## 2016-11-09 DIAGNOSIS — J9601 Acute respiratory failure with hypoxia: Secondary | ICD-10-CM | POA: Diagnosis not present

## 2016-11-09 DIAGNOSIS — I468 Cardiac arrest due to other underlying condition: Secondary | ICD-10-CM | POA: Diagnosis present

## 2016-11-09 DIAGNOSIS — S2243XA Multiple fractures of ribs, bilateral, initial encounter for closed fracture: Secondary | ICD-10-CM | POA: Diagnosis present

## 2016-11-09 DIAGNOSIS — J988 Other specified respiratory disorders: Secondary | ICD-10-CM | POA: Diagnosis not present

## 2016-11-09 DIAGNOSIS — R030 Elevated blood-pressure reading, without diagnosis of hypertension: Secondary | ICD-10-CM | POA: Diagnosis not present

## 2016-11-09 DIAGNOSIS — D6489 Other specified anemias: Secondary | ICD-10-CM | POA: Diagnosis not present

## 2016-11-09 DIAGNOSIS — G9340 Encephalopathy, unspecified: Secondary | ICD-10-CM | POA: Diagnosis not present

## 2016-11-09 DIAGNOSIS — R509 Fever, unspecified: Secondary | ICD-10-CM | POA: Diagnosis not present

## 2016-11-09 DIAGNOSIS — I517 Cardiomegaly: Secondary | ICD-10-CM | POA: Diagnosis not present

## 2016-11-09 DIAGNOSIS — R9401 Abnormal electroencephalogram [EEG]: Secondary | ICD-10-CM | POA: Diagnosis not present

## 2016-11-09 DIAGNOSIS — Z940 Kidney transplant status: Secondary | ICD-10-CM | POA: Diagnosis not present

## 2016-11-09 DIAGNOSIS — J9501 Hemorrhage from tracheostomy stoma: Secondary | ICD-10-CM | POA: Diagnosis not present

## 2016-11-09 DIAGNOSIS — S2220XA Unspecified fracture of sternum, initial encounter for closed fracture: Secondary | ICD-10-CM | POA: Diagnosis present

## 2016-11-09 DIAGNOSIS — T8612 Kidney transplant failure: Secondary | ICD-10-CM | POA: Diagnosis not present

## 2016-11-09 DIAGNOSIS — M898X8 Other specified disorders of bone, other site: Secondary | ICD-10-CM | POA: Diagnosis not present

## 2016-11-09 DIAGNOSIS — G40909 Epilepsy, unspecified, not intractable, without status epilepticus: Secondary | ICD-10-CM | POA: Diagnosis not present

## 2016-11-09 DIAGNOSIS — A419 Sepsis, unspecified organism: Secondary | ICD-10-CM | POA: Diagnosis not present

## 2016-11-09 DIAGNOSIS — J90 Pleural effusion, not elsewhere classified: Secondary | ICD-10-CM | POA: Diagnosis not present

## 2016-11-09 DIAGNOSIS — I82401 Acute embolism and thrombosis of unspecified deep veins of right lower extremity: Secondary | ICD-10-CM | POA: Diagnosis not present

## 2016-11-09 DIAGNOSIS — I888 Other nonspecific lymphadenitis: Secondary | ICD-10-CM | POA: Diagnosis not present

## 2016-11-09 DIAGNOSIS — D62 Acute posthemorrhagic anemia: Secondary | ICD-10-CM | POA: Diagnosis not present

## 2016-11-09 DIAGNOSIS — J95851 Ventilator associated pneumonia: Secondary | ICD-10-CM | POA: Diagnosis not present

## 2016-11-09 DIAGNOSIS — R221 Localized swelling, mass and lump, neck: Secondary | ICD-10-CM | POA: Diagnosis not present

## 2016-11-09 DIAGNOSIS — R000 Tachycardia, unspecified: Secondary | ICD-10-CM | POA: Diagnosis not present

## 2016-11-09 DIAGNOSIS — E875 Hyperkalemia: Secondary | ICD-10-CM | POA: Diagnosis not present

## 2016-11-09 DIAGNOSIS — Z8709 Personal history of other diseases of the respiratory system: Secondary | ICD-10-CM | POA: Diagnosis not present

## 2016-11-09 DIAGNOSIS — R5081 Fever presenting with conditions classified elsewhere: Secondary | ICD-10-CM | POA: Diagnosis not present

## 2016-11-09 DIAGNOSIS — D631 Anemia in chronic kidney disease: Secondary | ICD-10-CM | POA: Diagnosis not present

## 2016-11-09 DIAGNOSIS — K219 Gastro-esophageal reflux disease without esophagitis: Secondary | ICD-10-CM | POA: Diagnosis present

## 2016-11-09 DIAGNOSIS — R680 Hypothermia, not associated with low environmental temperature: Secondary | ICD-10-CM | POA: Diagnosis not present

## 2016-11-09 DIAGNOSIS — R092 Respiratory arrest: Secondary | ICD-10-CM | POA: Diagnosis not present

## 2016-11-09 DIAGNOSIS — R569 Unspecified convulsions: Secondary | ICD-10-CM | POA: Diagnosis not present

## 2016-11-09 DIAGNOSIS — E871 Hypo-osmolality and hyponatremia: Secondary | ICD-10-CM | POA: Diagnosis not present

## 2016-11-09 DIAGNOSIS — D649 Anemia, unspecified: Secondary | ICD-10-CM | POA: Diagnosis not present

## 2016-11-09 DIAGNOSIS — A4152 Sepsis due to Pseudomonas: Secondary | ICD-10-CM | POA: Diagnosis not present

## 2016-11-09 DIAGNOSIS — Z4682 Encounter for fitting and adjustment of non-vascular catheter: Secondary | ICD-10-CM | POA: Diagnosis not present

## 2016-11-09 DIAGNOSIS — I82409 Acute embolism and thrombosis of unspecified deep veins of unspecified lower extremity: Secondary | ICD-10-CM | POA: Diagnosis not present

## 2016-11-09 DIAGNOSIS — R1310 Dysphagia, unspecified: Secondary | ICD-10-CM | POA: Diagnosis not present

## 2016-11-09 DIAGNOSIS — E876 Hypokalemia: Secondary | ICD-10-CM | POA: Diagnosis not present

## 2016-11-09 DIAGNOSIS — R140 Abdominal distension (gaseous): Secondary | ICD-10-CM | POA: Diagnosis not present

## 2016-11-09 DIAGNOSIS — L7632 Postprocedural hematoma of skin and subcutaneous tissue following other procedure: Secondary | ICD-10-CM | POA: Diagnosis present

## 2016-11-09 DIAGNOSIS — L0211 Cutaneous abscess of neck: Secondary | ICD-10-CM | POA: Diagnosis not present

## 2016-11-09 DIAGNOSIS — Z7901 Long term (current) use of anticoagulants: Secondary | ICD-10-CM | POA: Diagnosis not present

## 2016-11-09 DIAGNOSIS — R4020 Unspecified coma: Secondary | ICD-10-CM | POA: Diagnosis not present

## 2016-11-09 DIAGNOSIS — E872 Acidosis: Secondary | ICD-10-CM | POA: Diagnosis not present

## 2016-11-09 DIAGNOSIS — A318 Other mycobacterial infections: Secondary | ICD-10-CM | POA: Diagnosis not present

## 2016-11-09 DIAGNOSIS — M069 Rheumatoid arthritis, unspecified: Secondary | ICD-10-CM | POA: Diagnosis not present

## 2016-11-09 DIAGNOSIS — J0410 Acute tracheitis without obstruction: Secondary | ICD-10-CM | POA: Diagnosis not present

## 2016-11-09 DIAGNOSIS — D899 Disorder involving the immune mechanism, unspecified: Secondary | ICD-10-CM | POA: Diagnosis not present

## 2016-11-09 DIAGNOSIS — J9690 Respiratory failure, unspecified, unspecified whether with hypoxia or hypercapnia: Secondary | ICD-10-CM | POA: Diagnosis not present

## 2016-11-09 DIAGNOSIS — Z930 Tracheostomy status: Secondary | ICD-10-CM | POA: Diagnosis not present

## 2016-11-09 DIAGNOSIS — N186 End stage renal disease: Secondary | ICD-10-CM | POA: Diagnosis not present

## 2016-11-09 DIAGNOSIS — I252 Old myocardial infarction: Secondary | ICD-10-CM | POA: Diagnosis not present

## 2016-11-09 DIAGNOSIS — I82491 Acute embolism and thrombosis of other specified deep vein of right lower extremity: Secondary | ICD-10-CM | POA: Diagnosis not present

## 2016-11-09 DIAGNOSIS — R0989 Other specified symptoms and signs involving the circulatory and respiratory systems: Secondary | ICD-10-CM | POA: Diagnosis not present

## 2016-11-09 DIAGNOSIS — J151 Pneumonia due to Pseudomonas: Secondary | ICD-10-CM | POA: Diagnosis not present

## 2016-11-09 DIAGNOSIS — L89892 Pressure ulcer of other site, stage 2: Secondary | ICD-10-CM | POA: Diagnosis not present

## 2016-11-09 DIAGNOSIS — R29818 Other symptoms and signs involving the nervous system: Secondary | ICD-10-CM | POA: Diagnosis not present

## 2016-11-20 DIAGNOSIS — Z992 Dependence on renal dialysis: Secondary | ICD-10-CM | POA: Diagnosis not present

## 2016-11-20 DIAGNOSIS — N186 End stage renal disease: Secondary | ICD-10-CM | POA: Diagnosis not present

## 2016-11-22 ENCOUNTER — Ambulatory Visit: Payer: Medicare Other

## 2016-11-22 ENCOUNTER — Encounter (INDEPENDENT_AMBULATORY_CARE_PROVIDER_SITE_OTHER): Payer: Medicare Other | Admitting: Podiatry

## 2016-11-22 DIAGNOSIS — S99922A Unspecified injury of left foot, initial encounter: Secondary | ICD-10-CM

## 2016-11-22 NOTE — Progress Notes (Signed)
This encounter was created in error - please disregard.

## 2016-12-07 DIAGNOSIS — Z7901 Long term (current) use of anticoagulants: Secondary | ICD-10-CM | POA: Diagnosis not present

## 2016-12-07 DIAGNOSIS — I82401 Acute embolism and thrombosis of unspecified deep veins of right lower extremity: Secondary | ICD-10-CM | POA: Diagnosis present

## 2016-12-07 DIAGNOSIS — G931 Anoxic brain damage, not elsewhere classified: Secondary | ICD-10-CM | POA: Diagnosis not present

## 2016-12-07 DIAGNOSIS — Z940 Kidney transplant status: Secondary | ICD-10-CM | POA: Diagnosis not present

## 2016-12-07 DIAGNOSIS — R1313 Dysphagia, pharyngeal phase: Secondary | ICD-10-CM | POA: Diagnosis present

## 2016-12-07 DIAGNOSIS — Z992 Dependence on renal dialysis: Secondary | ICD-10-CM | POA: Diagnosis not present

## 2016-12-07 DIAGNOSIS — A318 Other mycobacterial infections: Secondary | ICD-10-CM | POA: Diagnosis not present

## 2016-12-07 DIAGNOSIS — M7989 Other specified soft tissue disorders: Secondary | ICD-10-CM | POA: Diagnosis not present

## 2016-12-07 DIAGNOSIS — M25562 Pain in left knee: Secondary | ICD-10-CM | POA: Diagnosis not present

## 2016-12-07 DIAGNOSIS — R419 Unspecified symptoms and signs involving cognitive functions and awareness: Secondary | ICD-10-CM | POA: Diagnosis not present

## 2016-12-07 DIAGNOSIS — N186 End stage renal disease: Secondary | ICD-10-CM | POA: Diagnosis not present

## 2016-12-07 DIAGNOSIS — Z7409 Other reduced mobility: Secondary | ICD-10-CM | POA: Diagnosis not present

## 2016-12-07 DIAGNOSIS — S2243XD Multiple fractures of ribs, bilateral, subsequent encounter for fracture with routine healing: Secondary | ICD-10-CM | POA: Diagnosis not present

## 2016-12-07 DIAGNOSIS — I120 Hypertensive chronic kidney disease with stage 5 chronic kidney disease or end stage renal disease: Secondary | ICD-10-CM | POA: Diagnosis not present

## 2016-12-07 DIAGNOSIS — Z930 Tracheostomy status: Secondary | ICD-10-CM | POA: Diagnosis not present

## 2016-12-07 DIAGNOSIS — R4189 Other symptoms and signs involving cognitive functions and awareness: Secondary | ICD-10-CM | POA: Diagnosis not present

## 2016-12-07 DIAGNOSIS — R279 Unspecified lack of coordination: Secondary | ICD-10-CM | POA: Diagnosis present

## 2016-12-07 DIAGNOSIS — E871 Hypo-osmolality and hyponatremia: Secondary | ICD-10-CM | POA: Diagnosis present

## 2016-12-07 DIAGNOSIS — L859 Epidermal thickening, unspecified: Secondary | ICD-10-CM | POA: Diagnosis not present

## 2016-12-07 DIAGNOSIS — Z8674 Personal history of sudden cardiac arrest: Secondary | ICD-10-CM | POA: Diagnosis not present

## 2016-12-07 DIAGNOSIS — I469 Cardiac arrest, cause unspecified: Secondary | ICD-10-CM | POA: Diagnosis not present

## 2016-12-07 DIAGNOSIS — R569 Unspecified convulsions: Secondary | ICD-10-CM | POA: Diagnosis present

## 2016-12-07 DIAGNOSIS — T8612 Kidney transplant failure: Secondary | ICD-10-CM | POA: Diagnosis not present

## 2016-12-07 DIAGNOSIS — A312 Disseminated mycobacterium avium-intracellulare complex (DMAC): Secondary | ICD-10-CM | POA: Diagnosis not present

## 2016-12-07 DIAGNOSIS — D696 Thrombocytopenia, unspecified: Secondary | ICD-10-CM | POA: Diagnosis not present

## 2016-12-07 DIAGNOSIS — R6889 Other general symptoms and signs: Secondary | ICD-10-CM | POA: Diagnosis present

## 2016-12-07 DIAGNOSIS — D631 Anemia in chronic kidney disease: Secondary | ICD-10-CM | POA: Diagnosis not present

## 2016-12-07 DIAGNOSIS — R591 Generalized enlarged lymph nodes: Secondary | ICD-10-CM | POA: Diagnosis not present

## 2016-12-07 DIAGNOSIS — D899 Disorder involving the immune mechanism, unspecified: Secondary | ICD-10-CM | POA: Diagnosis not present

## 2016-12-07 DIAGNOSIS — L942 Calcinosis cutis: Secondary | ICD-10-CM | POA: Diagnosis not present

## 2016-12-07 DIAGNOSIS — L989 Disorder of the skin and subcutaneous tissue, unspecified: Secondary | ICD-10-CM | POA: Diagnosis not present

## 2016-12-22 DIAGNOSIS — N2581 Secondary hyperparathyroidism of renal origin: Secondary | ICD-10-CM | POA: Diagnosis not present

## 2016-12-22 DIAGNOSIS — N186 End stage renal disease: Secondary | ICD-10-CM | POA: Diagnosis not present

## 2016-12-22 DIAGNOSIS — D631 Anemia in chronic kidney disease: Secondary | ICD-10-CM | POA: Diagnosis not present

## 2016-12-22 DIAGNOSIS — I82409 Acute embolism and thrombosis of unspecified deep veins of unspecified lower extremity: Secondary | ICD-10-CM | POA: Diagnosis not present

## 2016-12-23 DIAGNOSIS — N2581 Secondary hyperparathyroidism of renal origin: Secondary | ICD-10-CM | POA: Diagnosis not present

## 2016-12-23 DIAGNOSIS — E8809 Other disorders of plasma-protein metabolism, not elsewhere classified: Secondary | ICD-10-CM | POA: Diagnosis not present

## 2016-12-23 DIAGNOSIS — G931 Anoxic brain damage, not elsewhere classified: Secondary | ICD-10-CM | POA: Diagnosis not present

## 2016-12-23 DIAGNOSIS — L942 Calcinosis cutis: Secondary | ICD-10-CM | POA: Diagnosis not present

## 2016-12-23 DIAGNOSIS — Z4822 Encounter for aftercare following kidney transplant: Secondary | ICD-10-CM | POA: Diagnosis not present

## 2016-12-23 DIAGNOSIS — Z7901 Long term (current) use of anticoagulants: Secondary | ICD-10-CM | POA: Diagnosis not present

## 2016-12-23 DIAGNOSIS — D899 Disorder involving the immune mechanism, unspecified: Secondary | ICD-10-CM | POA: Diagnosis not present

## 2016-12-23 DIAGNOSIS — I120 Hypertensive chronic kidney disease with stage 5 chronic kidney disease or end stage renal disease: Secondary | ICD-10-CM | POA: Diagnosis not present

## 2016-12-23 DIAGNOSIS — Z79899 Other long term (current) drug therapy: Secondary | ICD-10-CM | POA: Diagnosis not present

## 2016-12-23 DIAGNOSIS — R4189 Other symptoms and signs involving cognitive functions and awareness: Secondary | ICD-10-CM | POA: Diagnosis not present

## 2016-12-23 DIAGNOSIS — Z86718 Personal history of other venous thrombosis and embolism: Secondary | ICD-10-CM | POA: Diagnosis not present

## 2016-12-23 DIAGNOSIS — Z992 Dependence on renal dialysis: Secondary | ICD-10-CM | POA: Diagnosis not present

## 2016-12-23 DIAGNOSIS — N186 End stage renal disease: Secondary | ICD-10-CM | POA: Diagnosis not present

## 2016-12-23 DIAGNOSIS — Z7952 Long term (current) use of systemic steroids: Secondary | ICD-10-CM | POA: Diagnosis not present

## 2016-12-24 DIAGNOSIS — I82409 Acute embolism and thrombosis of unspecified deep veins of unspecified lower extremity: Secondary | ICD-10-CM | POA: Diagnosis not present

## 2016-12-24 DIAGNOSIS — N186 End stage renal disease: Secondary | ICD-10-CM | POA: Diagnosis not present

## 2016-12-24 DIAGNOSIS — N2581 Secondary hyperparathyroidism of renal origin: Secondary | ICD-10-CM | POA: Diagnosis not present

## 2016-12-24 DIAGNOSIS — D631 Anemia in chronic kidney disease: Secondary | ICD-10-CM | POA: Diagnosis not present

## 2016-12-27 DIAGNOSIS — D631 Anemia in chronic kidney disease: Secondary | ICD-10-CM | POA: Diagnosis not present

## 2016-12-27 DIAGNOSIS — N186 End stage renal disease: Secondary | ICD-10-CM | POA: Diagnosis not present

## 2016-12-27 DIAGNOSIS — Z7901 Long term (current) use of anticoagulants: Secondary | ICD-10-CM | POA: Diagnosis not present

## 2016-12-27 DIAGNOSIS — N2581 Secondary hyperparathyroidism of renal origin: Secondary | ICD-10-CM | POA: Diagnosis not present

## 2016-12-28 DIAGNOSIS — G931 Anoxic brain damage, not elsewhere classified: Secondary | ICD-10-CM | POA: Diagnosis not present

## 2016-12-28 DIAGNOSIS — R4189 Other symptoms and signs involving cognitive functions and awareness: Secondary | ICD-10-CM | POA: Diagnosis not present

## 2016-12-29 DIAGNOSIS — N186 End stage renal disease: Secondary | ICD-10-CM | POA: Diagnosis not present

## 2016-12-29 DIAGNOSIS — D631 Anemia in chronic kidney disease: Secondary | ICD-10-CM | POA: Diagnosis not present

## 2016-12-29 DIAGNOSIS — I82409 Acute embolism and thrombosis of unspecified deep veins of unspecified lower extremity: Secondary | ICD-10-CM | POA: Diagnosis not present

## 2016-12-29 DIAGNOSIS — N2581 Secondary hyperparathyroidism of renal origin: Secondary | ICD-10-CM | POA: Diagnosis not present

## 2016-12-29 DIAGNOSIS — Z114 Encounter for screening for human immunodeficiency virus [HIV]: Secondary | ICD-10-CM | POA: Diagnosis not present

## 2016-12-30 DIAGNOSIS — G931 Anoxic brain damage, not elsewhere classified: Secondary | ICD-10-CM | POA: Diagnosis not present

## 2016-12-30 DIAGNOSIS — R4189 Other symptoms and signs involving cognitive functions and awareness: Secondary | ICD-10-CM | POA: Diagnosis not present

## 2016-12-31 DIAGNOSIS — N186 End stage renal disease: Secondary | ICD-10-CM | POA: Diagnosis not present

## 2016-12-31 DIAGNOSIS — I82409 Acute embolism and thrombosis of unspecified deep veins of unspecified lower extremity: Secondary | ICD-10-CM | POA: Diagnosis not present

## 2016-12-31 DIAGNOSIS — N2581 Secondary hyperparathyroidism of renal origin: Secondary | ICD-10-CM | POA: Diagnosis not present

## 2016-12-31 DIAGNOSIS — D631 Anemia in chronic kidney disease: Secondary | ICD-10-CM | POA: Diagnosis not present

## 2017-01-02 DIAGNOSIS — G931 Anoxic brain damage, not elsewhere classified: Secondary | ICD-10-CM | POA: Diagnosis not present

## 2017-01-02 DIAGNOSIS — R4189 Other symptoms and signs involving cognitive functions and awareness: Secondary | ICD-10-CM | POA: Diagnosis not present

## 2017-01-03 DIAGNOSIS — N186 End stage renal disease: Secondary | ICD-10-CM | POA: Diagnosis not present

## 2017-01-03 DIAGNOSIS — N2581 Secondary hyperparathyroidism of renal origin: Secondary | ICD-10-CM | POA: Diagnosis not present

## 2017-01-03 DIAGNOSIS — Z7901 Long term (current) use of anticoagulants: Secondary | ICD-10-CM | POA: Diagnosis not present

## 2017-01-03 DIAGNOSIS — L942 Calcinosis cutis: Secondary | ICD-10-CM | POA: Diagnosis not present

## 2017-01-03 DIAGNOSIS — D631 Anemia in chronic kidney disease: Secondary | ICD-10-CM | POA: Diagnosis not present

## 2017-01-04 DIAGNOSIS — G931 Anoxic brain damage, not elsewhere classified: Secondary | ICD-10-CM | POA: Diagnosis not present

## 2017-01-04 DIAGNOSIS — R4189 Other symptoms and signs involving cognitive functions and awareness: Secondary | ICD-10-CM | POA: Diagnosis not present

## 2017-01-05 DIAGNOSIS — D631 Anemia in chronic kidney disease: Secondary | ICD-10-CM | POA: Diagnosis not present

## 2017-01-05 DIAGNOSIS — N186 End stage renal disease: Secondary | ICD-10-CM | POA: Diagnosis not present

## 2017-01-05 DIAGNOSIS — N2581 Secondary hyperparathyroidism of renal origin: Secondary | ICD-10-CM | POA: Diagnosis not present

## 2017-01-06 DIAGNOSIS — R4189 Other symptoms and signs involving cognitive functions and awareness: Secondary | ICD-10-CM | POA: Diagnosis not present

## 2017-01-06 DIAGNOSIS — G931 Anoxic brain damage, not elsewhere classified: Secondary | ICD-10-CM | POA: Diagnosis not present

## 2017-01-07 DIAGNOSIS — N2581 Secondary hyperparathyroidism of renal origin: Secondary | ICD-10-CM | POA: Diagnosis not present

## 2017-01-07 DIAGNOSIS — D631 Anemia in chronic kidney disease: Secondary | ICD-10-CM | POA: Diagnosis not present

## 2017-01-07 DIAGNOSIS — N186 End stage renal disease: Secondary | ICD-10-CM | POA: Diagnosis not present

## 2017-01-09 DIAGNOSIS — R4189 Other symptoms and signs involving cognitive functions and awareness: Secondary | ICD-10-CM | POA: Diagnosis not present

## 2017-01-09 DIAGNOSIS — G931 Anoxic brain damage, not elsewhere classified: Secondary | ICD-10-CM | POA: Diagnosis not present

## 2017-01-10 DIAGNOSIS — Z7901 Long term (current) use of anticoagulants: Secondary | ICD-10-CM | POA: Diagnosis not present

## 2017-01-10 DIAGNOSIS — N186 End stage renal disease: Secondary | ICD-10-CM | POA: Diagnosis not present

## 2017-01-10 DIAGNOSIS — D631 Anemia in chronic kidney disease: Secondary | ICD-10-CM | POA: Diagnosis not present

## 2017-01-10 DIAGNOSIS — N2581 Secondary hyperparathyroidism of renal origin: Secondary | ICD-10-CM | POA: Diagnosis not present

## 2017-01-11 DIAGNOSIS — M25562 Pain in left knee: Secondary | ICD-10-CM | POA: Diagnosis not present

## 2017-01-11 DIAGNOSIS — G8929 Other chronic pain: Secondary | ICD-10-CM | POA: Diagnosis not present

## 2017-01-12 DIAGNOSIS — G513 Clonic hemifacial spasm: Secondary | ICD-10-CM | POA: Diagnosis not present

## 2017-01-12 DIAGNOSIS — N186 End stage renal disease: Secondary | ICD-10-CM | POA: Diagnosis not present

## 2017-01-12 DIAGNOSIS — D631 Anemia in chronic kidney disease: Secondary | ICD-10-CM | POA: Diagnosis not present

## 2017-01-12 DIAGNOSIS — G510 Bell's palsy: Secondary | ICD-10-CM | POA: Diagnosis not present

## 2017-01-12 DIAGNOSIS — N2581 Secondary hyperparathyroidism of renal origin: Secondary | ICD-10-CM | POA: Diagnosis not present

## 2017-01-13 DIAGNOSIS — R4189 Other symptoms and signs involving cognitive functions and awareness: Secondary | ICD-10-CM | POA: Diagnosis not present

## 2017-01-13 DIAGNOSIS — G931 Anoxic brain damage, not elsewhere classified: Secondary | ICD-10-CM | POA: Diagnosis not present

## 2017-01-14 DIAGNOSIS — N2581 Secondary hyperparathyroidism of renal origin: Secondary | ICD-10-CM | POA: Diagnosis not present

## 2017-01-14 DIAGNOSIS — D631 Anemia in chronic kidney disease: Secondary | ICD-10-CM | POA: Diagnosis not present

## 2017-01-14 DIAGNOSIS — N186 End stage renal disease: Secondary | ICD-10-CM | POA: Diagnosis not present

## 2017-01-16 DIAGNOSIS — I82409 Acute embolism and thrombosis of unspecified deep veins of unspecified lower extremity: Secondary | ICD-10-CM | POA: Diagnosis not present

## 2017-01-16 DIAGNOSIS — R221 Localized swelling, mass and lump, neck: Secondary | ICD-10-CM | POA: Diagnosis not present

## 2017-01-17 DIAGNOSIS — D631 Anemia in chronic kidney disease: Secondary | ICD-10-CM | POA: Diagnosis not present

## 2017-01-17 DIAGNOSIS — N2581 Secondary hyperparathyroidism of renal origin: Secondary | ICD-10-CM | POA: Diagnosis not present

## 2017-01-17 DIAGNOSIS — N186 End stage renal disease: Secondary | ICD-10-CM | POA: Diagnosis not present

## 2017-01-18 DIAGNOSIS — Z992 Dependence on renal dialysis: Secondary | ICD-10-CM | POA: Diagnosis not present

## 2017-01-18 DIAGNOSIS — N186 End stage renal disease: Secondary | ICD-10-CM | POA: Diagnosis not present

## 2017-01-19 DIAGNOSIS — D631 Anemia in chronic kidney disease: Secondary | ICD-10-CM | POA: Diagnosis not present

## 2017-01-19 DIAGNOSIS — N186 End stage renal disease: Secondary | ICD-10-CM | POA: Diagnosis not present

## 2017-01-19 DIAGNOSIS — I82409 Acute embolism and thrombosis of unspecified deep veins of unspecified lower extremity: Secondary | ICD-10-CM | POA: Diagnosis not present

## 2017-01-19 DIAGNOSIS — L03211 Cellulitis of face: Secondary | ICD-10-CM | POA: Diagnosis not present

## 2017-01-20 DIAGNOSIS — R4189 Other symptoms and signs involving cognitive functions and awareness: Secondary | ICD-10-CM | POA: Diagnosis not present

## 2017-01-20 DIAGNOSIS — G931 Anoxic brain damage, not elsewhere classified: Secondary | ICD-10-CM | POA: Diagnosis not present

## 2017-01-21 DIAGNOSIS — D631 Anemia in chronic kidney disease: Secondary | ICD-10-CM | POA: Diagnosis not present

## 2017-01-21 DIAGNOSIS — N186 End stage renal disease: Secondary | ICD-10-CM | POA: Diagnosis not present

## 2017-01-21 DIAGNOSIS — L03211 Cellulitis of face: Secondary | ICD-10-CM | POA: Diagnosis not present

## 2017-01-23 DIAGNOSIS — G931 Anoxic brain damage, not elsewhere classified: Secondary | ICD-10-CM | POA: Diagnosis not present

## 2017-01-23 DIAGNOSIS — R4189 Other symptoms and signs involving cognitive functions and awareness: Secondary | ICD-10-CM | POA: Diagnosis not present

## 2017-01-24 DIAGNOSIS — D631 Anemia in chronic kidney disease: Secondary | ICD-10-CM | POA: Diagnosis not present

## 2017-01-24 DIAGNOSIS — Z7901 Long term (current) use of anticoagulants: Secondary | ICD-10-CM | POA: Diagnosis not present

## 2017-01-24 DIAGNOSIS — L03211 Cellulitis of face: Secondary | ICD-10-CM | POA: Diagnosis not present

## 2017-01-24 DIAGNOSIS — N186 End stage renal disease: Secondary | ICD-10-CM | POA: Diagnosis not present

## 2017-01-25 DIAGNOSIS — R4189 Other symptoms and signs involving cognitive functions and awareness: Secondary | ICD-10-CM | POA: Diagnosis not present

## 2017-01-25 DIAGNOSIS — G931 Anoxic brain damage, not elsewhere classified: Secondary | ICD-10-CM | POA: Diagnosis not present

## 2017-01-26 DIAGNOSIS — L03211 Cellulitis of face: Secondary | ICD-10-CM | POA: Diagnosis not present

## 2017-01-26 DIAGNOSIS — N186 End stage renal disease: Secondary | ICD-10-CM | POA: Diagnosis not present

## 2017-01-26 DIAGNOSIS — D631 Anemia in chronic kidney disease: Secondary | ICD-10-CM | POA: Diagnosis not present

## 2017-01-28 DIAGNOSIS — L03211 Cellulitis of face: Secondary | ICD-10-CM | POA: Diagnosis not present

## 2017-01-28 DIAGNOSIS — D631 Anemia in chronic kidney disease: Secondary | ICD-10-CM | POA: Diagnosis not present

## 2017-01-28 DIAGNOSIS — N186 End stage renal disease: Secondary | ICD-10-CM | POA: Diagnosis not present

## 2017-02-01 DIAGNOSIS — G931 Anoxic brain damage, not elsewhere classified: Secondary | ICD-10-CM | POA: Diagnosis not present

## 2017-02-01 DIAGNOSIS — N186 End stage renal disease: Secondary | ICD-10-CM | POA: Diagnosis not present

## 2017-02-01 DIAGNOSIS — L03211 Cellulitis of face: Secondary | ICD-10-CM | POA: Diagnosis not present

## 2017-02-01 DIAGNOSIS — Z7901 Long term (current) use of anticoagulants: Secondary | ICD-10-CM | POA: Diagnosis not present

## 2017-02-01 DIAGNOSIS — R4189 Other symptoms and signs involving cognitive functions and awareness: Secondary | ICD-10-CM | POA: Diagnosis not present

## 2017-02-01 DIAGNOSIS — D631 Anemia in chronic kidney disease: Secondary | ICD-10-CM | POA: Diagnosis not present

## 2017-02-02 DIAGNOSIS — N186 End stage renal disease: Secondary | ICD-10-CM | POA: Diagnosis not present

## 2017-02-02 DIAGNOSIS — L03211 Cellulitis of face: Secondary | ICD-10-CM | POA: Diagnosis not present

## 2017-02-02 DIAGNOSIS — Z7901 Long term (current) use of anticoagulants: Secondary | ICD-10-CM | POA: Diagnosis not present

## 2017-02-02 DIAGNOSIS — D631 Anemia in chronic kidney disease: Secondary | ICD-10-CM | POA: Diagnosis not present

## 2017-02-04 DIAGNOSIS — L03211 Cellulitis of face: Secondary | ICD-10-CM | POA: Diagnosis not present

## 2017-02-04 DIAGNOSIS — N186 End stage renal disease: Secondary | ICD-10-CM | POA: Diagnosis not present

## 2017-02-04 DIAGNOSIS — D631 Anemia in chronic kidney disease: Secondary | ICD-10-CM | POA: Diagnosis not present

## 2017-02-07 DIAGNOSIS — L03211 Cellulitis of face: Secondary | ICD-10-CM | POA: Diagnosis not present

## 2017-02-07 DIAGNOSIS — N186 End stage renal disease: Secondary | ICD-10-CM | POA: Diagnosis not present

## 2017-02-07 DIAGNOSIS — Z7901 Long term (current) use of anticoagulants: Secondary | ICD-10-CM | POA: Diagnosis not present

## 2017-02-07 DIAGNOSIS — D631 Anemia in chronic kidney disease: Secondary | ICD-10-CM | POA: Diagnosis not present

## 2017-02-09 DIAGNOSIS — N186 End stage renal disease: Secondary | ICD-10-CM | POA: Diagnosis not present

## 2017-02-09 DIAGNOSIS — Z9229 Personal history of other drug therapy: Secondary | ICD-10-CM | POA: Diagnosis not present

## 2017-02-09 DIAGNOSIS — L03211 Cellulitis of face: Secondary | ICD-10-CM | POA: Diagnosis not present

## 2017-02-09 DIAGNOSIS — D631 Anemia in chronic kidney disease: Secondary | ICD-10-CM | POA: Diagnosis not present

## 2017-02-09 DIAGNOSIS — A312 Disseminated mycobacterium avium-intracellulare complex (DMAC): Secondary | ICD-10-CM | POA: Diagnosis not present

## 2017-02-09 DIAGNOSIS — L0211 Cutaneous abscess of neck: Secondary | ICD-10-CM | POA: Diagnosis not present

## 2017-02-10 DIAGNOSIS — L0211 Cutaneous abscess of neck: Secondary | ICD-10-CM | POA: Diagnosis not present

## 2017-02-10 DIAGNOSIS — D899 Disorder involving the immune mechanism, unspecified: Secondary | ICD-10-CM | POA: Diagnosis not present

## 2017-02-10 DIAGNOSIS — L942 Calcinosis cutis: Secondary | ICD-10-CM | POA: Diagnosis not present

## 2017-02-10 DIAGNOSIS — Z992 Dependence on renal dialysis: Secondary | ICD-10-CM | POA: Diagnosis not present

## 2017-02-10 DIAGNOSIS — A312 Disseminated mycobacterium avium-intracellulare complex (DMAC): Secondary | ICD-10-CM | POA: Diagnosis not present

## 2017-02-10 DIAGNOSIS — T371X5S Adverse effect of antimycobacterial drugs, sequela: Secondary | ICD-10-CM | POA: Diagnosis not present

## 2017-02-11 DIAGNOSIS — N186 End stage renal disease: Secondary | ICD-10-CM | POA: Diagnosis not present

## 2017-02-11 DIAGNOSIS — D631 Anemia in chronic kidney disease: Secondary | ICD-10-CM | POA: Diagnosis not present

## 2017-02-11 DIAGNOSIS — L03211 Cellulitis of face: Secondary | ICD-10-CM | POA: Diagnosis not present

## 2017-02-13 DIAGNOSIS — Z79899 Other long term (current) drug therapy: Secondary | ICD-10-CM | POA: Diagnosis not present

## 2017-02-13 DIAGNOSIS — T371X5D Adverse effect of antimycobacterial drugs, subsequent encounter: Secondary | ICD-10-CM | POA: Diagnosis not present

## 2017-02-13 DIAGNOSIS — H04123 Dry eye syndrome of bilateral lacrimal glands: Secondary | ICD-10-CM | POA: Diagnosis not present

## 2017-02-14 DIAGNOSIS — Z7901 Long term (current) use of anticoagulants: Secondary | ICD-10-CM | POA: Diagnosis not present

## 2017-02-14 DIAGNOSIS — L03211 Cellulitis of face: Secondary | ICD-10-CM | POA: Diagnosis not present

## 2017-02-14 DIAGNOSIS — N186 End stage renal disease: Secondary | ICD-10-CM | POA: Diagnosis not present

## 2017-02-14 DIAGNOSIS — D631 Anemia in chronic kidney disease: Secondary | ICD-10-CM | POA: Diagnosis not present

## 2017-02-15 DIAGNOSIS — R590 Localized enlarged lymph nodes: Secondary | ICD-10-CM | POA: Diagnosis not present

## 2017-02-15 DIAGNOSIS — R221 Localized swelling, mass and lump, neck: Secondary | ICD-10-CM | POA: Diagnosis not present

## 2017-02-16 DIAGNOSIS — N186 End stage renal disease: Secondary | ICD-10-CM | POA: Diagnosis not present

## 2017-02-16 DIAGNOSIS — D631 Anemia in chronic kidney disease: Secondary | ICD-10-CM | POA: Diagnosis not present

## 2017-02-16 DIAGNOSIS — L03211 Cellulitis of face: Secondary | ICD-10-CM | POA: Diagnosis not present

## 2017-02-18 DIAGNOSIS — N186 End stage renal disease: Secondary | ICD-10-CM | POA: Diagnosis not present

## 2017-02-18 DIAGNOSIS — D631 Anemia in chronic kidney disease: Secondary | ICD-10-CM | POA: Diagnosis not present

## 2017-02-18 DIAGNOSIS — L03211 Cellulitis of face: Secondary | ICD-10-CM | POA: Diagnosis not present

## 2017-02-18 DIAGNOSIS — Z992 Dependence on renal dialysis: Secondary | ICD-10-CM | POA: Diagnosis not present

## 2017-02-19 DIAGNOSIS — L814 Other melanin hyperpigmentation: Secondary | ICD-10-CM | POA: Diagnosis not present

## 2017-02-21 DIAGNOSIS — N186 End stage renal disease: Secondary | ICD-10-CM | POA: Diagnosis not present

## 2017-02-21 DIAGNOSIS — D631 Anemia in chronic kidney disease: Secondary | ICD-10-CM | POA: Diagnosis not present

## 2017-02-21 DIAGNOSIS — N2581 Secondary hyperparathyroidism of renal origin: Secondary | ICD-10-CM | POA: Diagnosis not present

## 2017-02-21 DIAGNOSIS — Z7901 Long term (current) use of anticoagulants: Secondary | ICD-10-CM | POA: Diagnosis not present

## 2017-02-21 DIAGNOSIS — L03211 Cellulitis of face: Secondary | ICD-10-CM | POA: Diagnosis not present

## 2017-02-22 DIAGNOSIS — Z888 Allergy status to other drugs, medicaments and biological substances status: Secondary | ICD-10-CM | POA: Diagnosis not present

## 2017-02-22 DIAGNOSIS — R29818 Other symptoms and signs involving the nervous system: Secondary | ICD-10-CM | POA: Diagnosis not present

## 2017-02-22 DIAGNOSIS — R4189 Other symptoms and signs involving cognitive functions and awareness: Secondary | ICD-10-CM | POA: Diagnosis not present

## 2017-02-22 DIAGNOSIS — Z86718 Personal history of other venous thrombosis and embolism: Secondary | ICD-10-CM | POA: Diagnosis not present

## 2017-02-22 DIAGNOSIS — Z940 Kidney transplant status: Secondary | ICD-10-CM | POA: Diagnosis not present

## 2017-02-22 DIAGNOSIS — R569 Unspecified convulsions: Secondary | ICD-10-CM | POA: Diagnosis not present

## 2017-02-22 DIAGNOSIS — Z7409 Other reduced mobility: Secondary | ICD-10-CM | POA: Diagnosis not present

## 2017-02-22 DIAGNOSIS — Z7901 Long term (current) use of anticoagulants: Secondary | ICD-10-CM | POA: Diagnosis not present

## 2017-02-23 DIAGNOSIS — I10 Essential (primary) hypertension: Secondary | ICD-10-CM | POA: Diagnosis not present

## 2017-02-23 DIAGNOSIS — N186 End stage renal disease: Secondary | ICD-10-CM | POA: Diagnosis not present

## 2017-02-23 DIAGNOSIS — D631 Anemia in chronic kidney disease: Secondary | ICD-10-CM | POA: Diagnosis not present

## 2017-02-23 DIAGNOSIS — N2581 Secondary hyperparathyroidism of renal origin: Secondary | ICD-10-CM | POA: Diagnosis not present

## 2017-02-23 DIAGNOSIS — L03211 Cellulitis of face: Secondary | ICD-10-CM | POA: Diagnosis not present

## 2017-02-25 DIAGNOSIS — N186 End stage renal disease: Secondary | ICD-10-CM | POA: Diagnosis not present

## 2017-02-25 DIAGNOSIS — N2581 Secondary hyperparathyroidism of renal origin: Secondary | ICD-10-CM | POA: Diagnosis not present

## 2017-02-25 DIAGNOSIS — D631 Anemia in chronic kidney disease: Secondary | ICD-10-CM | POA: Diagnosis not present

## 2017-02-25 DIAGNOSIS — L03211 Cellulitis of face: Secondary | ICD-10-CM | POA: Diagnosis not present

## 2017-02-28 DIAGNOSIS — L03211 Cellulitis of face: Secondary | ICD-10-CM | POA: Diagnosis not present

## 2017-02-28 DIAGNOSIS — N2581 Secondary hyperparathyroidism of renal origin: Secondary | ICD-10-CM | POA: Diagnosis not present

## 2017-02-28 DIAGNOSIS — D631 Anemia in chronic kidney disease: Secondary | ICD-10-CM | POA: Diagnosis not present

## 2017-02-28 DIAGNOSIS — N186 End stage renal disease: Secondary | ICD-10-CM | POA: Diagnosis not present

## 2017-03-02 DIAGNOSIS — N2581 Secondary hyperparathyroidism of renal origin: Secondary | ICD-10-CM | POA: Diagnosis not present

## 2017-03-02 DIAGNOSIS — D631 Anemia in chronic kidney disease: Secondary | ICD-10-CM | POA: Diagnosis not present

## 2017-03-02 DIAGNOSIS — N186 End stage renal disease: Secondary | ICD-10-CM | POA: Diagnosis not present

## 2017-03-02 DIAGNOSIS — L03211 Cellulitis of face: Secondary | ICD-10-CM | POA: Diagnosis not present

## 2017-03-04 DIAGNOSIS — N186 End stage renal disease: Secondary | ICD-10-CM | POA: Diagnosis not present

## 2017-03-04 DIAGNOSIS — N2581 Secondary hyperparathyroidism of renal origin: Secondary | ICD-10-CM | POA: Diagnosis not present

## 2017-03-04 DIAGNOSIS — D631 Anemia in chronic kidney disease: Secondary | ICD-10-CM | POA: Diagnosis not present

## 2017-03-04 DIAGNOSIS — L03211 Cellulitis of face: Secondary | ICD-10-CM | POA: Diagnosis not present

## 2017-03-07 DIAGNOSIS — N186 End stage renal disease: Secondary | ICD-10-CM | POA: Diagnosis not present

## 2017-03-07 DIAGNOSIS — N2581 Secondary hyperparathyroidism of renal origin: Secondary | ICD-10-CM | POA: Diagnosis not present

## 2017-03-07 DIAGNOSIS — D631 Anemia in chronic kidney disease: Secondary | ICD-10-CM | POA: Diagnosis not present

## 2017-03-07 DIAGNOSIS — L03211 Cellulitis of face: Secondary | ICD-10-CM | POA: Diagnosis not present

## 2017-03-09 DIAGNOSIS — L03211 Cellulitis of face: Secondary | ICD-10-CM | POA: Diagnosis not present

## 2017-03-09 DIAGNOSIS — N2581 Secondary hyperparathyroidism of renal origin: Secondary | ICD-10-CM | POA: Diagnosis not present

## 2017-03-09 DIAGNOSIS — N186 End stage renal disease: Secondary | ICD-10-CM | POA: Diagnosis not present

## 2017-03-09 DIAGNOSIS — D631 Anemia in chronic kidney disease: Secondary | ICD-10-CM | POA: Diagnosis not present

## 2017-03-11 DIAGNOSIS — L03211 Cellulitis of face: Secondary | ICD-10-CM | POA: Diagnosis not present

## 2017-03-11 DIAGNOSIS — N186 End stage renal disease: Secondary | ICD-10-CM | POA: Diagnosis not present

## 2017-03-11 DIAGNOSIS — D631 Anemia in chronic kidney disease: Secondary | ICD-10-CM | POA: Diagnosis not present

## 2017-03-11 DIAGNOSIS — N2581 Secondary hyperparathyroidism of renal origin: Secondary | ICD-10-CM | POA: Diagnosis not present

## 2017-03-14 DIAGNOSIS — N2581 Secondary hyperparathyroidism of renal origin: Secondary | ICD-10-CM | POA: Diagnosis not present

## 2017-03-14 DIAGNOSIS — L03211 Cellulitis of face: Secondary | ICD-10-CM | POA: Diagnosis not present

## 2017-03-14 DIAGNOSIS — D631 Anemia in chronic kidney disease: Secondary | ICD-10-CM | POA: Diagnosis not present

## 2017-03-14 DIAGNOSIS — N186 End stage renal disease: Secondary | ICD-10-CM | POA: Diagnosis not present

## 2017-03-16 DIAGNOSIS — L03211 Cellulitis of face: Secondary | ICD-10-CM | POA: Diagnosis not present

## 2017-03-16 DIAGNOSIS — D631 Anemia in chronic kidney disease: Secondary | ICD-10-CM | POA: Diagnosis not present

## 2017-03-16 DIAGNOSIS — N2581 Secondary hyperparathyroidism of renal origin: Secondary | ICD-10-CM | POA: Diagnosis not present

## 2017-03-16 DIAGNOSIS — N186 End stage renal disease: Secondary | ICD-10-CM | POA: Diagnosis not present

## 2017-03-18 DIAGNOSIS — L03211 Cellulitis of face: Secondary | ICD-10-CM | POA: Diagnosis not present

## 2017-03-18 DIAGNOSIS — D631 Anemia in chronic kidney disease: Secondary | ICD-10-CM | POA: Diagnosis not present

## 2017-03-18 DIAGNOSIS — N186 End stage renal disease: Secondary | ICD-10-CM | POA: Diagnosis not present

## 2017-03-18 DIAGNOSIS — N2581 Secondary hyperparathyroidism of renal origin: Secondary | ICD-10-CM | POA: Diagnosis not present

## 2017-03-20 DIAGNOSIS — N2581 Secondary hyperparathyroidism of renal origin: Secondary | ICD-10-CM | POA: Diagnosis not present

## 2017-03-20 DIAGNOSIS — N186 End stage renal disease: Secondary | ICD-10-CM | POA: Diagnosis not present

## 2017-03-20 DIAGNOSIS — Z992 Dependence on renal dialysis: Secondary | ICD-10-CM | POA: Diagnosis not present

## 2017-03-20 DIAGNOSIS — L03211 Cellulitis of face: Secondary | ICD-10-CM | POA: Diagnosis not present

## 2017-03-20 DIAGNOSIS — D631 Anemia in chronic kidney disease: Secondary | ICD-10-CM | POA: Diagnosis not present

## 2017-03-22 DIAGNOSIS — D509 Iron deficiency anemia, unspecified: Secondary | ICD-10-CM | POA: Diagnosis not present

## 2017-03-22 DIAGNOSIS — N2581 Secondary hyperparathyroidism of renal origin: Secondary | ICD-10-CM | POA: Diagnosis not present

## 2017-03-22 DIAGNOSIS — N186 End stage renal disease: Secondary | ICD-10-CM | POA: Diagnosis not present

## 2017-03-22 DIAGNOSIS — B965 Pseudomonas (aeruginosa) (mallei) (pseudomallei) as the cause of diseases classified elsewhere: Secondary | ICD-10-CM | POA: Diagnosis not present

## 2017-03-22 DIAGNOSIS — D631 Anemia in chronic kidney disease: Secondary | ICD-10-CM | POA: Diagnosis not present

## 2017-03-24 DIAGNOSIS — L729 Follicular cyst of the skin and subcutaneous tissue, unspecified: Secondary | ICD-10-CM | POA: Diagnosis not present

## 2017-03-24 DIAGNOSIS — Z992 Dependence on renal dialysis: Secondary | ICD-10-CM | POA: Diagnosis not present

## 2017-03-24 DIAGNOSIS — L728 Other follicular cysts of the skin and subcutaneous tissue: Secondary | ICD-10-CM | POA: Diagnosis not present

## 2017-03-24 DIAGNOSIS — L942 Calcinosis cutis: Secondary | ICD-10-CM | POA: Diagnosis not present

## 2017-03-25 DIAGNOSIS — D509 Iron deficiency anemia, unspecified: Secondary | ICD-10-CM | POA: Diagnosis not present

## 2017-03-25 DIAGNOSIS — D631 Anemia in chronic kidney disease: Secondary | ICD-10-CM | POA: Diagnosis not present

## 2017-03-25 DIAGNOSIS — B965 Pseudomonas (aeruginosa) (mallei) (pseudomallei) as the cause of diseases classified elsewhere: Secondary | ICD-10-CM | POA: Diagnosis not present

## 2017-03-25 DIAGNOSIS — N2581 Secondary hyperparathyroidism of renal origin: Secondary | ICD-10-CM | POA: Diagnosis not present

## 2017-03-25 DIAGNOSIS — N186 End stage renal disease: Secondary | ICD-10-CM | POA: Diagnosis not present

## 2017-03-28 DIAGNOSIS — N186 End stage renal disease: Secondary | ICD-10-CM | POA: Diagnosis not present

## 2017-03-28 DIAGNOSIS — D509 Iron deficiency anemia, unspecified: Secondary | ICD-10-CM | POA: Diagnosis not present

## 2017-03-28 DIAGNOSIS — D631 Anemia in chronic kidney disease: Secondary | ICD-10-CM | POA: Diagnosis not present

## 2017-03-28 DIAGNOSIS — B965 Pseudomonas (aeruginosa) (mallei) (pseudomallei) as the cause of diseases classified elsewhere: Secondary | ICD-10-CM | POA: Diagnosis not present

## 2017-03-28 DIAGNOSIS — N2581 Secondary hyperparathyroidism of renal origin: Secondary | ICD-10-CM | POA: Diagnosis not present

## 2017-03-29 DIAGNOSIS — Z8679 Personal history of other diseases of the circulatory system: Secondary | ICD-10-CM | POA: Diagnosis not present

## 2017-03-29 DIAGNOSIS — Z9114 Patient's other noncompliance with medication regimen: Secondary | ICD-10-CM | POA: Diagnosis not present

## 2017-03-29 DIAGNOSIS — Z7901 Long term (current) use of anticoagulants: Secondary | ICD-10-CM | POA: Diagnosis not present

## 2017-03-29 DIAGNOSIS — Z8674 Personal history of sudden cardiac arrest: Secondary | ICD-10-CM | POA: Diagnosis not present

## 2017-03-29 DIAGNOSIS — G629 Polyneuropathy, unspecified: Secondary | ICD-10-CM | POA: Diagnosis present

## 2017-03-29 DIAGNOSIS — Z872 Personal history of diseases of the skin and subcutaneous tissue: Secondary | ICD-10-CM | POA: Diagnosis not present

## 2017-03-29 DIAGNOSIS — R509 Fever, unspecified: Secondary | ICD-10-CM | POA: Diagnosis not present

## 2017-03-29 DIAGNOSIS — R569 Unspecified convulsions: Secondary | ICD-10-CM | POA: Diagnosis present

## 2017-03-29 DIAGNOSIS — Z86718 Personal history of other venous thrombosis and embolism: Secondary | ICD-10-CM | POA: Diagnosis not present

## 2017-03-29 DIAGNOSIS — I120 Hypertensive chronic kidney disease with stage 5 chronic kidney disease or end stage renal disease: Secondary | ICD-10-CM | POA: Diagnosis not present

## 2017-03-29 DIAGNOSIS — D631 Anemia in chronic kidney disease: Secondary | ICD-10-CM | POA: Diagnosis not present

## 2017-03-29 DIAGNOSIS — A312 Disseminated mycobacterium avium-intracellulare complex (DMAC): Secondary | ICD-10-CM | POA: Diagnosis not present

## 2017-03-29 DIAGNOSIS — B965 Pseudomonas (aeruginosa) (mallei) (pseudomallei) as the cause of diseases classified elsewhere: Secondary | ICD-10-CM | POA: Diagnosis not present

## 2017-03-29 DIAGNOSIS — Z992 Dependence on renal dialysis: Secondary | ICD-10-CM | POA: Diagnosis not present

## 2017-03-29 DIAGNOSIS — M069 Rheumatoid arthritis, unspecified: Secondary | ICD-10-CM | POA: Diagnosis present

## 2017-03-29 DIAGNOSIS — K219 Gastro-esophageal reflux disease without esophagitis: Secondary | ICD-10-CM | POA: Diagnosis present

## 2017-03-29 DIAGNOSIS — A419 Sepsis, unspecified organism: Secondary | ICD-10-CM | POA: Diagnosis not present

## 2017-03-29 DIAGNOSIS — Z9889 Other specified postprocedural states: Secondary | ICD-10-CM | POA: Diagnosis not present

## 2017-03-29 DIAGNOSIS — D72829 Elevated white blood cell count, unspecified: Secondary | ICD-10-CM | POA: Diagnosis not present

## 2017-03-29 DIAGNOSIS — D696 Thrombocytopenia, unspecified: Secondary | ICD-10-CM | POA: Diagnosis not present

## 2017-03-29 DIAGNOSIS — L0211 Cutaneous abscess of neck: Secondary | ICD-10-CM | POA: Diagnosis not present

## 2017-03-29 DIAGNOSIS — Z7952 Long term (current) use of systemic steroids: Secondary | ICD-10-CM | POA: Diagnosis not present

## 2017-03-29 DIAGNOSIS — E041 Nontoxic single thyroid nodule: Secondary | ICD-10-CM | POA: Diagnosis not present

## 2017-03-29 DIAGNOSIS — Z8619 Personal history of other infectious and parasitic diseases: Secondary | ICD-10-CM | POA: Diagnosis not present

## 2017-03-29 DIAGNOSIS — Z940 Kidney transplant status: Secondary | ICD-10-CM | POA: Diagnosis not present

## 2017-03-29 DIAGNOSIS — N186 End stage renal disease: Secondary | ICD-10-CM | POA: Diagnosis not present

## 2017-04-01 DIAGNOSIS — D631 Anemia in chronic kidney disease: Secondary | ICD-10-CM | POA: Diagnosis not present

## 2017-04-01 DIAGNOSIS — N2581 Secondary hyperparathyroidism of renal origin: Secondary | ICD-10-CM | POA: Diagnosis not present

## 2017-04-01 DIAGNOSIS — N186 End stage renal disease: Secondary | ICD-10-CM | POA: Diagnosis not present

## 2017-04-01 DIAGNOSIS — B965 Pseudomonas (aeruginosa) (mallei) (pseudomallei) as the cause of diseases classified elsewhere: Secondary | ICD-10-CM | POA: Diagnosis not present

## 2017-04-01 DIAGNOSIS — D509 Iron deficiency anemia, unspecified: Secondary | ICD-10-CM | POA: Diagnosis not present

## 2017-04-03 DIAGNOSIS — B965 Pseudomonas (aeruginosa) (mallei) (pseudomallei) as the cause of diseases classified elsewhere: Secondary | ICD-10-CM | POA: Diagnosis not present

## 2017-04-04 DIAGNOSIS — N186 End stage renal disease: Secondary | ICD-10-CM | POA: Diagnosis not present

## 2017-04-04 DIAGNOSIS — N2581 Secondary hyperparathyroidism of renal origin: Secondary | ICD-10-CM | POA: Diagnosis not present

## 2017-04-04 DIAGNOSIS — D509 Iron deficiency anemia, unspecified: Secondary | ICD-10-CM | POA: Diagnosis not present

## 2017-04-04 DIAGNOSIS — D631 Anemia in chronic kidney disease: Secondary | ICD-10-CM | POA: Diagnosis not present

## 2017-04-04 DIAGNOSIS — B965 Pseudomonas (aeruginosa) (mallei) (pseudomallei) as the cause of diseases classified elsewhere: Secondary | ICD-10-CM | POA: Diagnosis not present

## 2017-04-06 DIAGNOSIS — N2581 Secondary hyperparathyroidism of renal origin: Secondary | ICD-10-CM | POA: Diagnosis not present

## 2017-04-06 DIAGNOSIS — D631 Anemia in chronic kidney disease: Secondary | ICD-10-CM | POA: Diagnosis not present

## 2017-04-06 DIAGNOSIS — B965 Pseudomonas (aeruginosa) (mallei) (pseudomallei) as the cause of diseases classified elsewhere: Secondary | ICD-10-CM | POA: Diagnosis not present

## 2017-04-06 DIAGNOSIS — D509 Iron deficiency anemia, unspecified: Secondary | ICD-10-CM | POA: Diagnosis not present

## 2017-04-06 DIAGNOSIS — N186 End stage renal disease: Secondary | ICD-10-CM | POA: Diagnosis not present

## 2017-04-08 DIAGNOSIS — D509 Iron deficiency anemia, unspecified: Secondary | ICD-10-CM | POA: Diagnosis not present

## 2017-04-08 DIAGNOSIS — B965 Pseudomonas (aeruginosa) (mallei) (pseudomallei) as the cause of diseases classified elsewhere: Secondary | ICD-10-CM | POA: Diagnosis not present

## 2017-04-08 DIAGNOSIS — N2581 Secondary hyperparathyroidism of renal origin: Secondary | ICD-10-CM | POA: Diagnosis not present

## 2017-04-08 DIAGNOSIS — D631 Anemia in chronic kidney disease: Secondary | ICD-10-CM | POA: Diagnosis not present

## 2017-04-08 DIAGNOSIS — N186 End stage renal disease: Secondary | ICD-10-CM | POA: Diagnosis not present

## 2017-04-11 DIAGNOSIS — D509 Iron deficiency anemia, unspecified: Secondary | ICD-10-CM | POA: Diagnosis not present

## 2017-04-11 DIAGNOSIS — N186 End stage renal disease: Secondary | ICD-10-CM | POA: Diagnosis not present

## 2017-04-11 DIAGNOSIS — B965 Pseudomonas (aeruginosa) (mallei) (pseudomallei) as the cause of diseases classified elsewhere: Secondary | ICD-10-CM | POA: Diagnosis not present

## 2017-04-11 DIAGNOSIS — N2581 Secondary hyperparathyroidism of renal origin: Secondary | ICD-10-CM | POA: Diagnosis not present

## 2017-04-11 DIAGNOSIS — D631 Anemia in chronic kidney disease: Secondary | ICD-10-CM | POA: Diagnosis not present

## 2017-04-12 DIAGNOSIS — N186 End stage renal disease: Secondary | ICD-10-CM | POA: Diagnosis not present

## 2017-04-12 DIAGNOSIS — I120 Hypertensive chronic kidney disease with stage 5 chronic kidney disease or end stage renal disease: Secondary | ICD-10-CM | POA: Diagnosis not present

## 2017-04-12 DIAGNOSIS — R221 Localized swelling, mass and lump, neck: Secondary | ICD-10-CM | POA: Diagnosis not present

## 2017-04-12 DIAGNOSIS — Z9889 Other specified postprocedural states: Secondary | ICD-10-CM | POA: Diagnosis not present

## 2017-04-12 DIAGNOSIS — Z888 Allergy status to other drugs, medicaments and biological substances status: Secondary | ICD-10-CM | POA: Diagnosis not present

## 2017-04-13 DIAGNOSIS — N2581 Secondary hyperparathyroidism of renal origin: Secondary | ICD-10-CM | POA: Diagnosis not present

## 2017-04-13 DIAGNOSIS — D509 Iron deficiency anemia, unspecified: Secondary | ICD-10-CM | POA: Diagnosis not present

## 2017-04-13 DIAGNOSIS — D631 Anemia in chronic kidney disease: Secondary | ICD-10-CM | POA: Diagnosis not present

## 2017-04-13 DIAGNOSIS — B965 Pseudomonas (aeruginosa) (mallei) (pseudomallei) as the cause of diseases classified elsewhere: Secondary | ICD-10-CM | POA: Diagnosis not present

## 2017-04-13 DIAGNOSIS — N186 End stage renal disease: Secondary | ICD-10-CM | POA: Diagnosis not present

## 2017-04-14 DIAGNOSIS — L720 Epidermal cyst: Secondary | ICD-10-CM | POA: Diagnosis not present

## 2017-04-14 DIAGNOSIS — R05 Cough: Secondary | ICD-10-CM | POA: Diagnosis not present

## 2017-04-14 DIAGNOSIS — R9401 Abnormal electroencephalogram [EEG]: Secondary | ICD-10-CM | POA: Diagnosis not present

## 2017-04-14 DIAGNOSIS — R569 Unspecified convulsions: Secondary | ICD-10-CM | POA: Diagnosis not present

## 2017-04-14 DIAGNOSIS — N186 End stage renal disease: Secondary | ICD-10-CM | POA: Diagnosis not present

## 2017-04-14 DIAGNOSIS — Z95828 Presence of other vascular implants and grafts: Secondary | ICD-10-CM | POA: Diagnosis not present

## 2017-04-14 DIAGNOSIS — Z992 Dependence on renal dialysis: Secondary | ICD-10-CM | POA: Diagnosis not present

## 2017-04-14 DIAGNOSIS — Z79899 Other long term (current) drug therapy: Secondary | ICD-10-CM | POA: Diagnosis not present

## 2017-04-14 DIAGNOSIS — A419 Sepsis, unspecified organism: Secondary | ICD-10-CM | POA: Diagnosis not present

## 2017-04-14 DIAGNOSIS — A312 Disseminated mycobacterium avium-intracellulare complex (DMAC): Secondary | ICD-10-CM | POA: Diagnosis not present

## 2017-04-14 DIAGNOSIS — L089 Local infection of the skin and subcutaneous tissue, unspecified: Secondary | ICD-10-CM | POA: Diagnosis not present

## 2017-04-17 DIAGNOSIS — N2581 Secondary hyperparathyroidism of renal origin: Secondary | ICD-10-CM | POA: Diagnosis not present

## 2017-04-17 DIAGNOSIS — N186 End stage renal disease: Secondary | ICD-10-CM | POA: Diagnosis not present

## 2017-04-17 DIAGNOSIS — D509 Iron deficiency anemia, unspecified: Secondary | ICD-10-CM | POA: Diagnosis not present

## 2017-04-17 DIAGNOSIS — B965 Pseudomonas (aeruginosa) (mallei) (pseudomallei) as the cause of diseases classified elsewhere: Secondary | ICD-10-CM | POA: Diagnosis not present

## 2017-04-17 DIAGNOSIS — D631 Anemia in chronic kidney disease: Secondary | ICD-10-CM | POA: Diagnosis not present

## 2017-04-20 DIAGNOSIS — N186 End stage renal disease: Secondary | ICD-10-CM | POA: Diagnosis not present

## 2017-04-20 DIAGNOSIS — B965 Pseudomonas (aeruginosa) (mallei) (pseudomallei) as the cause of diseases classified elsewhere: Secondary | ICD-10-CM | POA: Diagnosis not present

## 2017-04-20 DIAGNOSIS — N2581 Secondary hyperparathyroidism of renal origin: Secondary | ICD-10-CM | POA: Diagnosis not present

## 2017-04-20 DIAGNOSIS — Z79899 Other long term (current) drug therapy: Secondary | ICD-10-CM | POA: Diagnosis not present

## 2017-04-20 DIAGNOSIS — H04123 Dry eye syndrome of bilateral lacrimal glands: Secondary | ICD-10-CM | POA: Diagnosis not present

## 2017-04-20 DIAGNOSIS — G513 Clonic hemifacial spasm: Secondary | ICD-10-CM | POA: Diagnosis not present

## 2017-04-20 DIAGNOSIS — D509 Iron deficiency anemia, unspecified: Secondary | ICD-10-CM | POA: Diagnosis not present

## 2017-04-20 DIAGNOSIS — D631 Anemia in chronic kidney disease: Secondary | ICD-10-CM | POA: Diagnosis not present

## 2017-04-20 DIAGNOSIS — Z992 Dependence on renal dialysis: Secondary | ICD-10-CM | POA: Diagnosis not present

## 2017-04-20 DIAGNOSIS — T371X5A Adverse effect of antimycobacterial drugs, initial encounter: Secondary | ICD-10-CM | POA: Diagnosis not present

## 2017-04-20 DIAGNOSIS — H527 Unspecified disorder of refraction: Secondary | ICD-10-CM | POA: Diagnosis not present

## 2017-04-21 DIAGNOSIS — E8809 Other disorders of plasma-protein metabolism, not elsewhere classified: Secondary | ICD-10-CM | POA: Diagnosis not present

## 2017-04-21 DIAGNOSIS — K458 Other specified abdominal hernia without obstruction or gangrene: Secondary | ICD-10-CM | POA: Diagnosis not present

## 2017-04-21 DIAGNOSIS — R221 Localized swelling, mass and lump, neck: Secondary | ICD-10-CM | POA: Diagnosis not present

## 2017-04-21 DIAGNOSIS — Z992 Dependence on renal dialysis: Secondary | ICD-10-CM | POA: Diagnosis not present

## 2017-04-21 DIAGNOSIS — Z940 Kidney transplant status: Secondary | ICD-10-CM | POA: Diagnosis not present

## 2017-04-21 DIAGNOSIS — D631 Anemia in chronic kidney disease: Secondary | ICD-10-CM | POA: Diagnosis not present

## 2017-04-21 DIAGNOSIS — Z86718 Personal history of other venous thrombosis and embolism: Secondary | ICD-10-CM | POA: Diagnosis not present

## 2017-04-21 DIAGNOSIS — N186 End stage renal disease: Secondary | ICD-10-CM | POA: Diagnosis not present

## 2017-04-21 DIAGNOSIS — A312 Disseminated mycobacterium avium-intracellulare complex (DMAC): Secondary | ICD-10-CM | POA: Diagnosis not present

## 2017-04-21 DIAGNOSIS — L942 Calcinosis cutis: Secondary | ICD-10-CM | POA: Diagnosis not present

## 2017-04-21 DIAGNOSIS — N2581 Secondary hyperparathyroidism of renal origin: Secondary | ICD-10-CM | POA: Diagnosis not present

## 2017-04-21 DIAGNOSIS — Z79899 Other long term (current) drug therapy: Secondary | ICD-10-CM | POA: Diagnosis not present

## 2017-04-21 DIAGNOSIS — I120 Hypertensive chronic kidney disease with stage 5 chronic kidney disease or end stage renal disease: Secondary | ICD-10-CM | POA: Diagnosis not present

## 2017-04-21 DIAGNOSIS — R0989 Other specified symptoms and signs involving the circulatory and respiratory systems: Secondary | ICD-10-CM | POA: Diagnosis not present

## 2017-04-22 DIAGNOSIS — N186 End stage renal disease: Secondary | ICD-10-CM | POA: Diagnosis not present

## 2017-04-22 DIAGNOSIS — D631 Anemia in chronic kidney disease: Secondary | ICD-10-CM | POA: Diagnosis not present

## 2017-04-25 DIAGNOSIS — N186 End stage renal disease: Secondary | ICD-10-CM | POA: Diagnosis not present

## 2017-04-25 DIAGNOSIS — D631 Anemia in chronic kidney disease: Secondary | ICD-10-CM | POA: Diagnosis not present

## 2017-04-27 DIAGNOSIS — N186 End stage renal disease: Secondary | ICD-10-CM | POA: Diagnosis not present

## 2017-04-27 DIAGNOSIS — D631 Anemia in chronic kidney disease: Secondary | ICD-10-CM | POA: Diagnosis not present

## 2017-04-28 DIAGNOSIS — M059 Rheumatoid arthritis with rheumatoid factor, unspecified: Secondary | ICD-10-CM | POA: Diagnosis not present

## 2017-04-28 DIAGNOSIS — Z888 Allergy status to other drugs, medicaments and biological substances status: Secondary | ICD-10-CM | POA: Diagnosis not present

## 2017-04-28 DIAGNOSIS — Z91048 Other nonmedicinal substance allergy status: Secondary | ICD-10-CM | POA: Diagnosis not present

## 2017-04-28 DIAGNOSIS — L040 Acute lymphadenitis of face, head and neck: Secondary | ICD-10-CM | POA: Diagnosis not present

## 2017-04-28 DIAGNOSIS — G510 Bell's palsy: Secondary | ICD-10-CM | POA: Diagnosis not present

## 2017-04-28 DIAGNOSIS — D631 Anemia in chronic kidney disease: Secondary | ICD-10-CM | POA: Diagnosis not present

## 2017-04-28 DIAGNOSIS — R591 Generalized enlarged lymph nodes: Secondary | ICD-10-CM | POA: Diagnosis not present

## 2017-04-28 DIAGNOSIS — R221 Localized swelling, mass and lump, neck: Secondary | ICD-10-CM | POA: Diagnosis not present

## 2017-04-28 DIAGNOSIS — I881 Chronic lymphadenitis, except mesenteric: Secondary | ICD-10-CM | POA: Diagnosis not present

## 2017-04-28 DIAGNOSIS — I120 Hypertensive chronic kidney disease with stage 5 chronic kidney disease or end stage renal disease: Secondary | ICD-10-CM | POA: Diagnosis not present

## 2017-04-28 DIAGNOSIS — Z992 Dependence on renal dialysis: Secondary | ICD-10-CM | POA: Diagnosis not present

## 2017-04-28 DIAGNOSIS — Z940 Kidney transplant status: Secondary | ICD-10-CM | POA: Diagnosis not present

## 2017-04-28 DIAGNOSIS — Z905 Acquired absence of kidney: Secondary | ICD-10-CM | POA: Diagnosis not present

## 2017-04-28 DIAGNOSIS — K219 Gastro-esophageal reflux disease without esophagitis: Secondary | ICD-10-CM | POA: Diagnosis not present

## 2017-04-28 DIAGNOSIS — Z86718 Personal history of other venous thrombosis and embolism: Secondary | ICD-10-CM | POA: Diagnosis not present

## 2017-04-28 DIAGNOSIS — N186 End stage renal disease: Secondary | ICD-10-CM | POA: Diagnosis not present

## 2017-04-28 DIAGNOSIS — L0211 Cutaneous abscess of neck: Secondary | ICD-10-CM | POA: Diagnosis not present

## 2017-04-29 DIAGNOSIS — G510 Bell's palsy: Secondary | ICD-10-CM | POA: Diagnosis not present

## 2017-04-29 DIAGNOSIS — D631 Anemia in chronic kidney disease: Secondary | ICD-10-CM | POA: Diagnosis not present

## 2017-04-29 DIAGNOSIS — I120 Hypertensive chronic kidney disease with stage 5 chronic kidney disease or end stage renal disease: Secondary | ICD-10-CM | POA: Diagnosis not present

## 2017-04-29 DIAGNOSIS — N186 End stage renal disease: Secondary | ICD-10-CM | POA: Diagnosis not present

## 2017-04-29 DIAGNOSIS — L040 Acute lymphadenitis of face, head and neck: Secondary | ICD-10-CM | POA: Diagnosis not present

## 2017-04-29 DIAGNOSIS — M059 Rheumatoid arthritis with rheumatoid factor, unspecified: Secondary | ICD-10-CM | POA: Diagnosis not present

## 2017-05-01 DIAGNOSIS — D631 Anemia in chronic kidney disease: Secondary | ICD-10-CM | POA: Diagnosis not present

## 2017-05-01 DIAGNOSIS — N186 End stage renal disease: Secondary | ICD-10-CM | POA: Diagnosis not present

## 2017-05-03 DIAGNOSIS — K658 Other peritonitis: Secondary | ICD-10-CM | POA: Diagnosis not present

## 2017-05-03 DIAGNOSIS — R140 Abdominal distension (gaseous): Secondary | ICD-10-CM | POA: Diagnosis not present

## 2017-05-03 DIAGNOSIS — R109 Unspecified abdominal pain: Secondary | ICD-10-CM | POA: Diagnosis not present

## 2017-05-03 DIAGNOSIS — K652 Spontaneous bacterial peritonitis: Secondary | ICD-10-CM | POA: Diagnosis not present

## 2017-05-03 DIAGNOSIS — R591 Generalized enlarged lymph nodes: Secondary | ICD-10-CM | POA: Diagnosis not present

## 2017-05-03 DIAGNOSIS — A419 Sepsis, unspecified organism: Secondary | ICD-10-CM | POA: Diagnosis not present

## 2017-05-03 DIAGNOSIS — G9341 Metabolic encephalopathy: Secondary | ICD-10-CM | POA: Diagnosis not present

## 2017-05-03 DIAGNOSIS — R110 Nausea: Secondary | ICD-10-CM | POA: Diagnosis not present

## 2017-05-03 DIAGNOSIS — R6521 Severe sepsis with septic shock: Secondary | ICD-10-CM | POA: Diagnosis not present

## 2017-05-03 DIAGNOSIS — R638 Other symptoms and signs concerning food and fluid intake: Secondary | ICD-10-CM | POA: Diagnosis not present

## 2017-05-03 DIAGNOSIS — N186 End stage renal disease: Secondary | ICD-10-CM | POA: Diagnosis not present

## 2017-05-03 DIAGNOSIS — I517 Cardiomegaly: Secondary | ICD-10-CM | POA: Diagnosis not present

## 2017-05-03 DIAGNOSIS — R5383 Other fatigue: Secondary | ICD-10-CM | POA: Diagnosis not present

## 2017-05-03 DIAGNOSIS — B9689 Other specified bacterial agents as the cause of diseases classified elsewhere: Secondary | ICD-10-CM | POA: Diagnosis not present

## 2017-05-03 DIAGNOSIS — R509 Fever, unspecified: Secondary | ICD-10-CM | POA: Diagnosis not present

## 2017-05-03 DIAGNOSIS — I1311 Hypertensive heart and chronic kidney disease without heart failure, with stage 5 chronic kidney disease, or end stage renal disease: Secondary | ICD-10-CM | POA: Diagnosis not present

## 2017-05-04 DIAGNOSIS — D631 Anemia in chronic kidney disease: Secondary | ICD-10-CM | POA: Diagnosis not present

## 2017-05-04 DIAGNOSIS — N186 End stage renal disease: Secondary | ICD-10-CM | POA: Diagnosis not present

## 2017-05-06 DIAGNOSIS — G9341 Metabolic encephalopathy: Secondary | ICD-10-CM | POA: Diagnosis not present

## 2017-05-06 DIAGNOSIS — Z86718 Personal history of other venous thrombosis and embolism: Secondary | ICD-10-CM | POA: Diagnosis not present

## 2017-05-06 DIAGNOSIS — R4182 Altered mental status, unspecified: Secondary | ICD-10-CM | POA: Diagnosis not present

## 2017-05-06 DIAGNOSIS — R109 Unspecified abdominal pain: Secondary | ICD-10-CM | POA: Diagnosis not present

## 2017-05-06 DIAGNOSIS — I9762 Postprocedural hemorrhage of a circulatory system organ or structure following other procedure: Secondary | ICD-10-CM | POA: Diagnosis not present

## 2017-05-06 DIAGNOSIS — B9689 Other specified bacterial agents as the cause of diseases classified elsewhere: Secondary | ICD-10-CM | POA: Diagnosis not present

## 2017-05-06 DIAGNOSIS — M0680 Other specified rheumatoid arthritis, unspecified site: Secondary | ICD-10-CM | POA: Diagnosis not present

## 2017-05-06 DIAGNOSIS — E873 Alkalosis: Secondary | ICD-10-CM | POA: Diagnosis not present

## 2017-05-06 DIAGNOSIS — Z87440 Personal history of urinary (tract) infections: Secondary | ICD-10-CM | POA: Diagnosis not present

## 2017-05-06 DIAGNOSIS — K652 Spontaneous bacterial peritonitis: Secondary | ICD-10-CM | POA: Diagnosis not present

## 2017-05-06 DIAGNOSIS — R5383 Other fatigue: Secondary | ICD-10-CM | POA: Diagnosis not present

## 2017-05-06 DIAGNOSIS — I120 Hypertensive chronic kidney disease with stage 5 chronic kidney disease or end stage renal disease: Secondary | ICD-10-CM | POA: Diagnosis not present

## 2017-05-06 DIAGNOSIS — R1084 Generalized abdominal pain: Secondary | ICD-10-CM | POA: Diagnosis not present

## 2017-05-06 DIAGNOSIS — Z87898 Personal history of other specified conditions: Secondary | ICD-10-CM | POA: Diagnosis not present

## 2017-05-06 DIAGNOSIS — R6520 Severe sepsis without septic shock: Secondary | ICD-10-CM | POA: Diagnosis not present

## 2017-05-06 DIAGNOSIS — D631 Anemia in chronic kidney disease: Secondary | ICD-10-CM | POA: Diagnosis not present

## 2017-05-06 DIAGNOSIS — E2740 Unspecified adrenocortical insufficiency: Secondary | ICD-10-CM | POA: Diagnosis not present

## 2017-05-06 DIAGNOSIS — R638 Other symptoms and signs concerning food and fluid intake: Secondary | ICD-10-CM | POA: Diagnosis not present

## 2017-05-06 DIAGNOSIS — R064 Hyperventilation: Secondary | ICD-10-CM | POA: Diagnosis not present

## 2017-05-06 DIAGNOSIS — Z8619 Personal history of other infectious and parasitic diseases: Secondary | ICD-10-CM | POA: Diagnosis not present

## 2017-05-06 DIAGNOSIS — I959 Hypotension, unspecified: Secondary | ICD-10-CM | POA: Diagnosis not present

## 2017-05-06 DIAGNOSIS — I1311 Hypertensive heart and chronic kidney disease without heart failure, with stage 5 chronic kidney disease, or end stage renal disease: Secondary | ICD-10-CM | POA: Diagnosis present

## 2017-05-06 DIAGNOSIS — Z940 Kidney transplant status: Secondary | ICD-10-CM | POA: Diagnosis not present

## 2017-05-06 DIAGNOSIS — R591 Generalized enlarged lymph nodes: Secondary | ICD-10-CM | POA: Diagnosis not present

## 2017-05-06 DIAGNOSIS — K650 Generalized (acute) peritonitis: Secondary | ICD-10-CM | POA: Diagnosis not present

## 2017-05-06 DIAGNOSIS — E871 Hypo-osmolality and hyponatremia: Secondary | ICD-10-CM | POA: Diagnosis not present

## 2017-05-06 DIAGNOSIS — R140 Abdominal distension (gaseous): Secondary | ICD-10-CM | POA: Diagnosis not present

## 2017-05-06 DIAGNOSIS — K659 Peritonitis, unspecified: Secondary | ICD-10-CM | POA: Diagnosis not present

## 2017-05-06 DIAGNOSIS — E8770 Fluid overload, unspecified: Secondary | ICD-10-CM | POA: Diagnosis not present

## 2017-05-06 DIAGNOSIS — L7622 Postprocedural hemorrhage and hematoma of skin and subcutaneous tissue following other procedure: Secondary | ICD-10-CM | POA: Diagnosis not present

## 2017-05-06 DIAGNOSIS — A310 Pulmonary mycobacterial infection: Secondary | ICD-10-CM | POA: Diagnosis not present

## 2017-05-06 DIAGNOSIS — R000 Tachycardia, unspecified: Secondary | ICD-10-CM | POA: Diagnosis not present

## 2017-05-06 DIAGNOSIS — Z992 Dependence on renal dialysis: Secondary | ICD-10-CM | POA: Diagnosis not present

## 2017-05-06 DIAGNOSIS — I82401 Acute embolism and thrombosis of unspecified deep veins of right lower extremity: Secondary | ICD-10-CM | POA: Diagnosis present

## 2017-05-06 DIAGNOSIS — E872 Acidosis: Secondary | ICD-10-CM | POA: Diagnosis not present

## 2017-05-06 DIAGNOSIS — J984 Other disorders of lung: Secondary | ICD-10-CM | POA: Diagnosis not present

## 2017-05-06 DIAGNOSIS — Z7952 Long term (current) use of systemic steroids: Secondary | ICD-10-CM | POA: Diagnosis not present

## 2017-05-06 DIAGNOSIS — D62 Acute posthemorrhagic anemia: Secondary | ICD-10-CM | POA: Diagnosis not present

## 2017-05-06 DIAGNOSIS — R6521 Severe sepsis with septic shock: Secondary | ICD-10-CM | POA: Diagnosis not present

## 2017-05-06 DIAGNOSIS — Z872 Personal history of diseases of the skin and subcutaneous tissue: Secondary | ICD-10-CM | POA: Diagnosis not present

## 2017-05-06 DIAGNOSIS — Z9911 Dependence on respirator [ventilator] status: Secondary | ICD-10-CM | POA: Diagnosis not present

## 2017-05-06 DIAGNOSIS — Z86711 Personal history of pulmonary embolism: Secondary | ICD-10-CM | POA: Diagnosis not present

## 2017-05-06 DIAGNOSIS — A419 Sepsis, unspecified organism: Secondary | ICD-10-CM | POA: Diagnosis not present

## 2017-05-06 DIAGNOSIS — K658 Other peritonitis: Secondary | ICD-10-CM | POA: Diagnosis not present

## 2017-05-06 DIAGNOSIS — A312 Disseminated mycobacterium avium-intracellulare complex (DMAC): Secondary | ICD-10-CM | POA: Diagnosis not present

## 2017-05-06 DIAGNOSIS — N186 End stage renal disease: Secondary | ICD-10-CM | POA: Diagnosis not present

## 2017-05-06 DIAGNOSIS — T8612 Kidney transplant failure: Secondary | ICD-10-CM | POA: Diagnosis not present

## 2017-05-06 DIAGNOSIS — K91841 Postprocedural hemorrhage and hematoma of a digestive system organ or structure following other procedure: Secondary | ICD-10-CM | POA: Diagnosis not present

## 2017-05-06 DIAGNOSIS — D65 Disseminated intravascular coagulation [defibrination syndrome]: Secondary | ICD-10-CM | POA: Diagnosis not present

## 2017-05-06 DIAGNOSIS — Z79899 Other long term (current) drug therapy: Secondary | ICD-10-CM | POA: Diagnosis not present

## 2017-05-06 DIAGNOSIS — M069 Rheumatoid arthritis, unspecified: Secondary | ICD-10-CM | POA: Diagnosis not present

## 2017-05-06 DIAGNOSIS — G9340 Encephalopathy, unspecified: Secondary | ICD-10-CM | POA: Diagnosis not present

## 2017-05-06 DIAGNOSIS — K91840 Postprocedural hemorrhage and hematoma of a digestive system organ or structure following a digestive system procedure: Secondary | ICD-10-CM | POA: Diagnosis not present

## 2017-05-06 DIAGNOSIS — Z959 Presence of cardiac and vascular implant and graft, unspecified: Secondary | ICD-10-CM | POA: Diagnosis not present

## 2017-05-06 DIAGNOSIS — K668 Other specified disorders of peritoneum: Secondary | ICD-10-CM | POA: Diagnosis not present

## 2017-05-06 DIAGNOSIS — E46 Unspecified protein-calorie malnutrition: Secondary | ICD-10-CM | POA: Diagnosis not present

## 2017-05-06 DIAGNOSIS — E874 Mixed disorder of acid-base balance: Secondary | ICD-10-CM | POA: Diagnosis present

## 2017-05-06 DIAGNOSIS — E278 Other specified disorders of adrenal gland: Secondary | ICD-10-CM | POA: Diagnosis not present

## 2017-05-06 DIAGNOSIS — D691 Qualitative platelet defects: Secondary | ICD-10-CM | POA: Diagnosis not present

## 2017-05-06 DIAGNOSIS — T8611 Kidney transplant rejection: Secondary | ICD-10-CM | POA: Diagnosis present

## 2017-05-06 DIAGNOSIS — K921 Melena: Secondary | ICD-10-CM | POA: Diagnosis not present

## 2017-05-06 DIAGNOSIS — R509 Fever, unspecified: Secondary | ICD-10-CM | POA: Diagnosis not present

## 2017-05-06 DIAGNOSIS — R918 Other nonspecific abnormal finding of lung field: Secondary | ICD-10-CM | POA: Diagnosis not present

## 2017-05-06 DIAGNOSIS — D899 Disorder involving the immune mechanism, unspecified: Secondary | ICD-10-CM | POA: Diagnosis not present

## 2017-05-06 DIAGNOSIS — E875 Hyperkalemia: Secondary | ICD-10-CM | POA: Diagnosis not present

## 2017-05-06 DIAGNOSIS — R10819 Abdominal tenderness, unspecified site: Secondary | ICD-10-CM | POA: Diagnosis not present

## 2017-05-06 DIAGNOSIS — R188 Other ascites: Secondary | ICD-10-CM | POA: Diagnosis not present

## 2017-05-06 DIAGNOSIS — E279 Disorder of adrenal gland, unspecified: Secondary | ICD-10-CM | POA: Diagnosis not present

## 2017-05-06 DIAGNOSIS — G40909 Epilepsy, unspecified, not intractable, without status epilepticus: Secondary | ICD-10-CM | POA: Diagnosis not present

## 2017-05-06 DIAGNOSIS — K651 Peritoneal abscess: Secondary | ICD-10-CM | POA: Diagnosis not present

## 2017-05-06 DIAGNOSIS — I517 Cardiomegaly: Secondary | ICD-10-CM | POA: Diagnosis not present

## 2017-05-06 DIAGNOSIS — D72829 Elevated white blood cell count, unspecified: Secondary | ICD-10-CM | POA: Diagnosis not present

## 2017-05-06 DIAGNOSIS — R110 Nausea: Secondary | ICD-10-CM | POA: Diagnosis not present

## 2017-05-06 DIAGNOSIS — S31609A Unspecified open wound of abdominal wall, unspecified quadrant with penetration into peritoneal cavity, initial encounter: Secondary | ICD-10-CM | POA: Diagnosis not present

## 2017-05-06 DIAGNOSIS — Z978 Presence of other specified devices: Secondary | ICD-10-CM | POA: Diagnosis not present

## 2017-05-06 DIAGNOSIS — K219 Gastro-esophageal reflux disease without esophagitis: Secondary | ICD-10-CM | POA: Diagnosis not present

## 2017-05-20 DIAGNOSIS — N186 End stage renal disease: Secondary | ICD-10-CM | POA: Diagnosis not present

## 2017-05-20 DIAGNOSIS — Z992 Dependence on renal dialysis: Secondary | ICD-10-CM | POA: Diagnosis not present

## 2017-05-24 DIAGNOSIS — K652 Spontaneous bacterial peritonitis: Secondary | ICD-10-CM | POA: Diagnosis not present

## 2017-05-24 DIAGNOSIS — Z48815 Encounter for surgical aftercare following surgery on the digestive system: Secondary | ICD-10-CM | POA: Diagnosis not present

## 2017-05-25 DIAGNOSIS — I10 Essential (primary) hypertension: Secondary | ICD-10-CM | POA: Diagnosis not present

## 2017-05-25 DIAGNOSIS — Z48815 Encounter for surgical aftercare following surgery on the digestive system: Secondary | ICD-10-CM | POA: Diagnosis not present

## 2017-05-25 DIAGNOSIS — D631 Anemia in chronic kidney disease: Secondary | ICD-10-CM | POA: Diagnosis not present

## 2017-05-25 DIAGNOSIS — N2581 Secondary hyperparathyroidism of renal origin: Secondary | ICD-10-CM | POA: Diagnosis not present

## 2017-05-25 DIAGNOSIS — N186 End stage renal disease: Secondary | ICD-10-CM | POA: Diagnosis not present

## 2017-05-25 DIAGNOSIS — K652 Spontaneous bacterial peritonitis: Secondary | ICD-10-CM | POA: Diagnosis not present

## 2017-05-26 DIAGNOSIS — K652 Spontaneous bacterial peritonitis: Secondary | ICD-10-CM | POA: Diagnosis not present

## 2017-05-26 DIAGNOSIS — Z48815 Encounter for surgical aftercare following surgery on the digestive system: Secondary | ICD-10-CM | POA: Diagnosis not present

## 2017-05-27 DIAGNOSIS — N186 End stage renal disease: Secondary | ICD-10-CM | POA: Diagnosis not present

## 2017-05-27 DIAGNOSIS — D631 Anemia in chronic kidney disease: Secondary | ICD-10-CM | POA: Diagnosis not present

## 2017-05-27 DIAGNOSIS — N2581 Secondary hyperparathyroidism of renal origin: Secondary | ICD-10-CM | POA: Diagnosis not present

## 2017-05-30 DIAGNOSIS — N186 End stage renal disease: Secondary | ICD-10-CM | POA: Diagnosis not present

## 2017-06-01 DIAGNOSIS — N186 End stage renal disease: Secondary | ICD-10-CM | POA: Diagnosis not present

## 2017-06-03 DIAGNOSIS — N186 End stage renal disease: Secondary | ICD-10-CM | POA: Diagnosis not present

## 2017-06-06 DIAGNOSIS — N186 End stage renal disease: Secondary | ICD-10-CM | POA: Diagnosis not present

## 2017-06-07 DIAGNOSIS — I120 Hypertensive chronic kidney disease with stage 5 chronic kidney disease or end stage renal disease: Secondary | ICD-10-CM | POA: Diagnosis not present

## 2017-06-07 DIAGNOSIS — I10 Essential (primary) hypertension: Secondary | ICD-10-CM | POA: Diagnosis not present

## 2017-06-07 DIAGNOSIS — R109 Unspecified abdominal pain: Secondary | ICD-10-CM | POA: Diagnosis not present

## 2017-06-07 DIAGNOSIS — N186 End stage renal disease: Secondary | ICD-10-CM | POA: Diagnosis not present

## 2017-06-07 DIAGNOSIS — X58XXXA Exposure to other specified factors, initial encounter: Secondary | ICD-10-CM | POA: Diagnosis not present

## 2017-06-07 DIAGNOSIS — Z992 Dependence on renal dialysis: Secondary | ICD-10-CM | POA: Diagnosis not present

## 2017-06-07 DIAGNOSIS — T8189XA Other complications of procedures, not elsewhere classified, initial encounter: Secondary | ICD-10-CM | POA: Diagnosis not present

## 2017-06-07 DIAGNOSIS — S31109A Unspecified open wound of abdominal wall, unspecified quadrant without penetration into peritoneal cavity, initial encounter: Secondary | ICD-10-CM | POA: Diagnosis not present

## 2017-06-08 DIAGNOSIS — N186 End stage renal disease: Secondary | ICD-10-CM | POA: Diagnosis not present

## 2017-06-10 DIAGNOSIS — D631 Anemia in chronic kidney disease: Secondary | ICD-10-CM | POA: Diagnosis not present

## 2017-06-10 DIAGNOSIS — N2581 Secondary hyperparathyroidism of renal origin: Secondary | ICD-10-CM | POA: Diagnosis not present

## 2017-06-10 DIAGNOSIS — N186 End stage renal disease: Secondary | ICD-10-CM | POA: Diagnosis not present

## 2017-06-12 DIAGNOSIS — N2581 Secondary hyperparathyroidism of renal origin: Secondary | ICD-10-CM | POA: Diagnosis not present

## 2017-06-12 DIAGNOSIS — D631 Anemia in chronic kidney disease: Secondary | ICD-10-CM | POA: Diagnosis not present

## 2017-06-12 DIAGNOSIS — N186 End stage renal disease: Secondary | ICD-10-CM | POA: Diagnosis not present

## 2017-06-13 DIAGNOSIS — G40909 Epilepsy, unspecified, not intractable, without status epilepticus: Secondary | ICD-10-CM | POA: Diagnosis not present

## 2017-06-13 DIAGNOSIS — K651 Peritoneal abscess: Secondary | ICD-10-CM | POA: Diagnosis not present

## 2017-06-13 DIAGNOSIS — G4089 Other seizures: Secondary | ICD-10-CM | POA: Diagnosis not present

## 2017-06-13 DIAGNOSIS — Z888 Allergy status to other drugs, medicaments and biological substances status: Secondary | ICD-10-CM | POA: Diagnosis not present

## 2017-06-13 DIAGNOSIS — T8131XA Disruption of external operation (surgical) wound, not elsewhere classified, initial encounter: Secondary | ICD-10-CM | POA: Diagnosis present

## 2017-06-13 DIAGNOSIS — E8770 Fluid overload, unspecified: Secondary | ICD-10-CM | POA: Diagnosis not present

## 2017-06-13 DIAGNOSIS — N186 End stage renal disease: Secondary | ICD-10-CM | POA: Diagnosis not present

## 2017-06-13 DIAGNOSIS — K862 Cyst of pancreas: Secondary | ICD-10-CM | POA: Diagnosis not present

## 2017-06-13 DIAGNOSIS — N9489 Other specified conditions associated with female genital organs and menstrual cycle: Secondary | ICD-10-CM | POA: Diagnosis not present

## 2017-06-13 DIAGNOSIS — Z79899 Other long term (current) drug therapy: Secondary | ICD-10-CM | POA: Diagnosis not present

## 2017-06-13 DIAGNOSIS — Z4889 Encounter for other specified surgical aftercare: Secondary | ICD-10-CM | POA: Diagnosis not present

## 2017-06-13 DIAGNOSIS — R000 Tachycardia, unspecified: Secondary | ICD-10-CM | POA: Diagnosis not present

## 2017-06-13 DIAGNOSIS — Z940 Kidney transplant status: Secondary | ICD-10-CM | POA: Diagnosis not present

## 2017-06-13 DIAGNOSIS — D631 Anemia in chronic kidney disease: Secondary | ICD-10-CM | POA: Diagnosis not present

## 2017-06-13 DIAGNOSIS — T8131XS Disruption of external operation (surgical) wound, not elsewhere classified, sequela: Secondary | ICD-10-CM | POA: Diagnosis not present

## 2017-06-13 DIAGNOSIS — Z7951 Long term (current) use of inhaled steroids: Secondary | ICD-10-CM | POA: Diagnosis not present

## 2017-06-13 DIAGNOSIS — R609 Edema, unspecified: Secondary | ICD-10-CM | POA: Diagnosis not present

## 2017-06-13 DIAGNOSIS — E860 Dehydration: Secondary | ICD-10-CM | POA: Diagnosis present

## 2017-06-13 DIAGNOSIS — Z7952 Long term (current) use of systemic steroids: Secondary | ICD-10-CM | POA: Diagnosis not present

## 2017-06-13 DIAGNOSIS — Z992 Dependence on renal dialysis: Secondary | ICD-10-CM | POA: Diagnosis not present

## 2017-06-13 DIAGNOSIS — Z634 Disappearance and death of family member: Secondary | ICD-10-CM | POA: Diagnosis not present

## 2017-06-13 DIAGNOSIS — M6281 Muscle weakness (generalized): Secondary | ICD-10-CM | POA: Diagnosis not present

## 2017-06-13 DIAGNOSIS — I120 Hypertensive chronic kidney disease with stage 5 chronic kidney disease or end stage renal disease: Secondary | ICD-10-CM | POA: Diagnosis not present

## 2017-06-13 DIAGNOSIS — E8809 Other disorders of plasma-protein metabolism, not elsewhere classified: Secondary | ICD-10-CM | POA: Diagnosis present

## 2017-06-13 DIAGNOSIS — K219 Gastro-esophageal reflux disease without esophagitis: Secondary | ICD-10-CM | POA: Diagnosis not present

## 2017-06-13 DIAGNOSIS — T8130XA Disruption of wound, unspecified, initial encounter: Secondary | ICD-10-CM | POA: Diagnosis not present

## 2017-06-13 DIAGNOSIS — R1084 Generalized abdominal pain: Secondary | ICD-10-CM | POA: Diagnosis not present

## 2017-06-13 DIAGNOSIS — T8189XA Other complications of procedures, not elsewhere classified, initial encounter: Secondary | ICD-10-CM | POA: Diagnosis not present

## 2017-06-13 DIAGNOSIS — R600 Localized edema: Secondary | ICD-10-CM | POA: Diagnosis not present

## 2017-06-13 DIAGNOSIS — Z792 Long term (current) use of antibiotics: Secondary | ICD-10-CM | POA: Diagnosis not present

## 2017-06-13 DIAGNOSIS — A312 Disseminated mycobacterium avium-intracellulare complex (DMAC): Secondary | ICD-10-CM | POA: Diagnosis not present

## 2017-06-13 DIAGNOSIS — M069 Rheumatoid arthritis, unspecified: Secondary | ICD-10-CM | POA: Diagnosis not present

## 2017-06-13 DIAGNOSIS — I10 Essential (primary) hypertension: Secondary | ICD-10-CM | POA: Diagnosis not present

## 2017-06-13 DIAGNOSIS — Z86718 Personal history of other venous thrombosis and embolism: Secondary | ICD-10-CM | POA: Diagnosis not present

## 2017-06-13 DIAGNOSIS — I82401 Acute embolism and thrombosis of unspecified deep veins of right lower extremity: Secondary | ICD-10-CM | POA: Diagnosis not present

## 2017-06-13 DIAGNOSIS — F339 Major depressive disorder, recurrent, unspecified: Secondary | ICD-10-CM | POA: Diagnosis not present

## 2017-06-14 DIAGNOSIS — R000 Tachycardia, unspecified: Secondary | ICD-10-CM | POA: Diagnosis not present

## 2017-06-14 DIAGNOSIS — R600 Localized edema: Secondary | ICD-10-CM | POA: Diagnosis not present

## 2017-06-20 DIAGNOSIS — T8132XA Disruption of internal operation (surgical) wound, not elsewhere classified, initial encounter: Secondary | ICD-10-CM | POA: Diagnosis not present

## 2017-06-20 DIAGNOSIS — D631 Anemia in chronic kidney disease: Secondary | ICD-10-CM | POA: Diagnosis present

## 2017-06-20 DIAGNOSIS — N2581 Secondary hyperparathyroidism of renal origin: Secondary | ICD-10-CM | POA: Diagnosis present

## 2017-06-20 DIAGNOSIS — R110 Nausea: Secondary | ICD-10-CM | POA: Diagnosis not present

## 2017-06-20 DIAGNOSIS — N186 End stage renal disease: Secondary | ICD-10-CM | POA: Diagnosis not present

## 2017-06-20 DIAGNOSIS — G4089 Other seizures: Secondary | ICD-10-CM | POA: Diagnosis not present

## 2017-06-20 DIAGNOSIS — I120 Hypertensive chronic kidney disease with stage 5 chronic kidney disease or end stage renal disease: Secondary | ICD-10-CM | POA: Diagnosis present

## 2017-06-20 DIAGNOSIS — M6281 Muscle weakness (generalized): Secondary | ICD-10-CM | POA: Diagnosis not present

## 2017-06-20 DIAGNOSIS — L0291 Cutaneous abscess, unspecified: Secondary | ICD-10-CM | POA: Diagnosis not present

## 2017-06-20 DIAGNOSIS — I10 Essential (primary) hypertension: Secondary | ICD-10-CM | POA: Diagnosis not present

## 2017-06-20 DIAGNOSIS — T8131XS Disruption of external operation (surgical) wound, not elsewhere classified, sequela: Secondary | ICD-10-CM | POA: Diagnosis not present

## 2017-06-20 DIAGNOSIS — G40909 Epilepsy, unspecified, not intractable, without status epilepticus: Secondary | ICD-10-CM | POA: Diagnosis present

## 2017-06-20 DIAGNOSIS — Z4889 Encounter for other specified surgical aftercare: Secondary | ICD-10-CM | POA: Diagnosis not present

## 2017-06-20 DIAGNOSIS — Z992 Dependence on renal dialysis: Secondary | ICD-10-CM | POA: Diagnosis not present

## 2017-06-20 DIAGNOSIS — Z86718 Personal history of other venous thrombosis and embolism: Secondary | ICD-10-CM | POA: Diagnosis not present

## 2017-06-20 DIAGNOSIS — B999 Unspecified infectious disease: Secondary | ICD-10-CM | POA: Diagnosis not present

## 2017-06-20 DIAGNOSIS — A312 Disseminated mycobacterium avium-intracellulare complex (DMAC): Secondary | ICD-10-CM | POA: Diagnosis present

## 2017-06-20 DIAGNOSIS — M069 Rheumatoid arthritis, unspecified: Secondary | ICD-10-CM | POA: Diagnosis present

## 2017-06-20 DIAGNOSIS — E8779 Other fluid overload: Secondary | ICD-10-CM | POA: Diagnosis not present

## 2017-06-20 DIAGNOSIS — T8619 Other complication of kidney transplant: Secondary | ICD-10-CM | POA: Diagnosis present

## 2017-06-20 DIAGNOSIS — K219 Gastro-esophageal reflux disease without esophagitis: Secondary | ICD-10-CM | POA: Diagnosis present

## 2017-06-20 DIAGNOSIS — F339 Major depressive disorder, recurrent, unspecified: Secondary | ICD-10-CM | POA: Diagnosis not present

## 2017-06-20 DIAGNOSIS — Z940 Kidney transplant status: Secondary | ICD-10-CM | POA: Diagnosis not present

## 2017-06-20 DIAGNOSIS — Z87440 Personal history of urinary (tract) infections: Secondary | ICD-10-CM | POA: Diagnosis not present

## 2017-06-22 DIAGNOSIS — D631 Anemia in chronic kidney disease: Secondary | ICD-10-CM | POA: Diagnosis not present

## 2017-06-22 DIAGNOSIS — E8779 Other fluid overload: Secondary | ICD-10-CM | POA: Diagnosis not present

## 2017-06-22 DIAGNOSIS — N186 End stage renal disease: Secondary | ICD-10-CM | POA: Diagnosis not present

## 2017-06-23 DIAGNOSIS — Z87440 Personal history of urinary (tract) infections: Secondary | ICD-10-CM | POA: Diagnosis not present

## 2017-06-23 DIAGNOSIS — Z992 Dependence on renal dialysis: Secondary | ICD-10-CM | POA: Diagnosis not present

## 2017-06-23 DIAGNOSIS — M6281 Muscle weakness (generalized): Secondary | ICD-10-CM | POA: Diagnosis not present

## 2017-06-23 DIAGNOSIS — Z86718 Personal history of other venous thrombosis and embolism: Secondary | ICD-10-CM | POA: Diagnosis not present

## 2017-06-23 DIAGNOSIS — K219 Gastro-esophageal reflux disease without esophagitis: Secondary | ICD-10-CM | POA: Diagnosis present

## 2017-06-23 DIAGNOSIS — T8619 Other complication of kidney transplant: Secondary | ICD-10-CM | POA: Diagnosis present

## 2017-06-23 DIAGNOSIS — G4089 Other seizures: Secondary | ICD-10-CM | POA: Diagnosis not present

## 2017-06-23 DIAGNOSIS — T8132XA Disruption of internal operation (surgical) wound, not elsewhere classified, initial encounter: Secondary | ICD-10-CM | POA: Diagnosis not present

## 2017-06-23 DIAGNOSIS — I120 Hypertensive chronic kidney disease with stage 5 chronic kidney disease or end stage renal disease: Secondary | ICD-10-CM | POA: Diagnosis not present

## 2017-06-23 DIAGNOSIS — N186 End stage renal disease: Secondary | ICD-10-CM | POA: Diagnosis not present

## 2017-06-23 DIAGNOSIS — Z940 Kidney transplant status: Secondary | ICD-10-CM | POA: Diagnosis not present

## 2017-06-23 DIAGNOSIS — L0291 Cutaneous abscess, unspecified: Secondary | ICD-10-CM | POA: Diagnosis not present

## 2017-06-23 DIAGNOSIS — N2581 Secondary hyperparathyroidism of renal origin: Secondary | ICD-10-CM | POA: Diagnosis present

## 2017-06-23 DIAGNOSIS — F339 Major depressive disorder, recurrent, unspecified: Secondary | ICD-10-CM | POA: Diagnosis not present

## 2017-06-23 DIAGNOSIS — R110 Nausea: Secondary | ICD-10-CM | POA: Diagnosis not present

## 2017-06-23 DIAGNOSIS — A312 Disseminated mycobacterium avium-intracellulare complex (DMAC): Secondary | ICD-10-CM | POA: Diagnosis present

## 2017-06-23 DIAGNOSIS — G40909 Epilepsy, unspecified, not intractable, without status epilepticus: Secondary | ICD-10-CM | POA: Diagnosis present

## 2017-06-23 DIAGNOSIS — Z4889 Encounter for other specified surgical aftercare: Secondary | ICD-10-CM | POA: Diagnosis not present

## 2017-06-23 DIAGNOSIS — M069 Rheumatoid arthritis, unspecified: Secondary | ICD-10-CM | POA: Diagnosis present

## 2017-06-23 DIAGNOSIS — I10 Essential (primary) hypertension: Secondary | ICD-10-CM | POA: Diagnosis not present

## 2017-06-23 DIAGNOSIS — D631 Anemia in chronic kidney disease: Secondary | ICD-10-CM | POA: Diagnosis present

## 2017-06-23 DIAGNOSIS — T8131XS Disruption of external operation (surgical) wound, not elsewhere classified, sequela: Secondary | ICD-10-CM | POA: Diagnosis not present

## 2017-06-23 DIAGNOSIS — B999 Unspecified infectious disease: Secondary | ICD-10-CM | POA: Diagnosis not present

## 2017-06-25 DIAGNOSIS — T8130XA Disruption of wound, unspecified, initial encounter: Secondary | ICD-10-CM | POA: Diagnosis not present

## 2017-06-25 DIAGNOSIS — G8929 Other chronic pain: Secondary | ICD-10-CM | POA: Diagnosis not present

## 2017-06-25 DIAGNOSIS — K913 Postprocedural intestinal obstruction, unspecified as to partial versus complete: Secondary | ICD-10-CM | POA: Diagnosis not present

## 2017-06-25 DIAGNOSIS — M79604 Pain in right leg: Secondary | ICD-10-CM | POA: Diagnosis not present

## 2017-06-25 DIAGNOSIS — T8612 Kidney transplant failure: Secondary | ICD-10-CM | POA: Diagnosis present

## 2017-06-25 DIAGNOSIS — D631 Anemia in chronic kidney disease: Secondary | ICD-10-CM | POA: Diagnosis not present

## 2017-06-25 DIAGNOSIS — M25562 Pain in left knee: Secondary | ICD-10-CM | POA: Diagnosis not present

## 2017-06-25 DIAGNOSIS — X58XXXA Exposure to other specified factors, initial encounter: Secondary | ICD-10-CM | POA: Diagnosis not present

## 2017-06-25 DIAGNOSIS — L98499 Non-pressure chronic ulcer of skin of other sites with unspecified severity: Secondary | ICD-10-CM | POA: Diagnosis not present

## 2017-06-25 DIAGNOSIS — R52 Pain, unspecified: Secondary | ICD-10-CM | POA: Diagnosis not present

## 2017-06-25 DIAGNOSIS — T8131XA Disruption of external operation (surgical) wound, not elsewhere classified, initial encounter: Secondary | ICD-10-CM | POA: Diagnosis not present

## 2017-06-25 DIAGNOSIS — T8132XA Disruption of internal operation (surgical) wound, not elsewhere classified, initial encounter: Secondary | ICD-10-CM | POA: Diagnosis not present

## 2017-06-25 DIAGNOSIS — Z4889 Encounter for other specified surgical aftercare: Secondary | ICD-10-CM | POA: Diagnosis not present

## 2017-06-25 DIAGNOSIS — R1010 Upper abdominal pain, unspecified: Secondary | ICD-10-CM | POA: Diagnosis not present

## 2017-06-25 DIAGNOSIS — Z86718 Personal history of other venous thrombosis and embolism: Secondary | ICD-10-CM | POA: Diagnosis not present

## 2017-06-25 DIAGNOSIS — I120 Hypertensive chronic kidney disease with stage 5 chronic kidney disease or end stage renal disease: Secondary | ICD-10-CM | POA: Diagnosis not present

## 2017-06-25 DIAGNOSIS — A312 Disseminated mycobacterium avium-intracellulare complex (DMAC): Secondary | ICD-10-CM | POA: Diagnosis not present

## 2017-06-25 DIAGNOSIS — E8779 Other fluid overload: Secondary | ICD-10-CM | POA: Diagnosis not present

## 2017-06-25 DIAGNOSIS — T8131XS Disruption of external operation (surgical) wound, not elsewhere classified, sequela: Secondary | ICD-10-CM | POA: Diagnosis not present

## 2017-06-25 DIAGNOSIS — R636 Underweight: Secondary | ICD-10-CM | POA: Diagnosis present

## 2017-06-25 DIAGNOSIS — K660 Peritoneal adhesions (postprocedural) (postinfection): Secondary | ICD-10-CM | POA: Diagnosis not present

## 2017-06-25 DIAGNOSIS — R9431 Abnormal electrocardiogram [ECG] [EKG]: Secondary | ICD-10-CM | POA: Diagnosis not present

## 2017-06-25 DIAGNOSIS — K219 Gastro-esophageal reflux disease without esophagitis: Secondary | ICD-10-CM | POA: Diagnosis present

## 2017-06-25 DIAGNOSIS — E8770 Fluid overload, unspecified: Secondary | ICD-10-CM | POA: Diagnosis not present

## 2017-06-25 DIAGNOSIS — Z792 Long term (current) use of antibiotics: Secondary | ICD-10-CM | POA: Diagnosis not present

## 2017-06-25 DIAGNOSIS — N186 End stage renal disease: Secondary | ICD-10-CM | POA: Diagnosis not present

## 2017-06-25 DIAGNOSIS — Y999 Unspecified external cause status: Secondary | ICD-10-CM | POA: Diagnosis not present

## 2017-06-25 DIAGNOSIS — M069 Rheumatoid arthritis, unspecified: Secondary | ICD-10-CM | POA: Diagnosis not present

## 2017-06-25 DIAGNOSIS — Z7952 Long term (current) use of systemic steroids: Secondary | ICD-10-CM | POA: Diagnosis not present

## 2017-06-25 DIAGNOSIS — G4089 Other seizures: Secondary | ICD-10-CM | POA: Diagnosis not present

## 2017-06-25 DIAGNOSIS — Z4682 Encounter for fitting and adjustment of non-vascular catheter: Secondary | ICD-10-CM | POA: Diagnosis not present

## 2017-06-25 DIAGNOSIS — M7989 Other specified soft tissue disorders: Secondary | ICD-10-CM | POA: Diagnosis not present

## 2017-06-25 DIAGNOSIS — M0689 Other specified rheumatoid arthritis, multiple sites: Secondary | ICD-10-CM | POA: Diagnosis present

## 2017-06-25 DIAGNOSIS — I10 Essential (primary) hypertension: Secondary | ICD-10-CM | POA: Diagnosis not present

## 2017-06-25 DIAGNOSIS — F339 Major depressive disorder, recurrent, unspecified: Secondary | ICD-10-CM | POA: Diagnosis not present

## 2017-06-25 DIAGNOSIS — Z681 Body mass index (BMI) 19 or less, adult: Secondary | ICD-10-CM | POA: Diagnosis not present

## 2017-06-25 DIAGNOSIS — M6281 Muscle weakness (generalized): Secondary | ICD-10-CM | POA: Diagnosis not present

## 2017-06-25 DIAGNOSIS — Z87440 Personal history of urinary (tract) infections: Secondary | ICD-10-CM | POA: Diagnosis not present

## 2017-06-25 DIAGNOSIS — R000 Tachycardia, unspecified: Secondary | ICD-10-CM | POA: Diagnosis not present

## 2017-06-25 DIAGNOSIS — L89159 Pressure ulcer of sacral region, unspecified stage: Secondary | ICD-10-CM | POA: Diagnosis present

## 2017-06-25 DIAGNOSIS — Z940 Kidney transplant status: Secondary | ICD-10-CM | POA: Diagnosis not present

## 2017-06-25 DIAGNOSIS — Z992 Dependence on renal dialysis: Secondary | ICD-10-CM | POA: Diagnosis not present

## 2017-06-25 DIAGNOSIS — K651 Peritoneal abscess: Secondary | ICD-10-CM | POA: Diagnosis not present

## 2017-06-27 DIAGNOSIS — E8779 Other fluid overload: Secondary | ICD-10-CM | POA: Diagnosis not present

## 2017-06-27 DIAGNOSIS — D631 Anemia in chronic kidney disease: Secondary | ICD-10-CM | POA: Diagnosis not present

## 2017-06-27 DIAGNOSIS — N186 End stage renal disease: Secondary | ICD-10-CM | POA: Diagnosis not present

## 2017-06-29 DIAGNOSIS — E8779 Other fluid overload: Secondary | ICD-10-CM | POA: Diagnosis not present

## 2017-06-29 DIAGNOSIS — N186 End stage renal disease: Secondary | ICD-10-CM | POA: Diagnosis not present

## 2017-06-29 DIAGNOSIS — L98499 Non-pressure chronic ulcer of skin of other sites with unspecified severity: Secondary | ICD-10-CM | POA: Diagnosis not present

## 2017-06-29 DIAGNOSIS — D631 Anemia in chronic kidney disease: Secondary | ICD-10-CM | POA: Diagnosis not present

## 2017-06-30 DIAGNOSIS — M79604 Pain in right leg: Secondary | ICD-10-CM | POA: Diagnosis not present

## 2017-06-30 DIAGNOSIS — M7989 Other specified soft tissue disorders: Secondary | ICD-10-CM | POA: Diagnosis not present

## 2017-06-30 DIAGNOSIS — Z4682 Encounter for fitting and adjustment of non-vascular catheter: Secondary | ICD-10-CM | POA: Diagnosis not present

## 2017-06-30 DIAGNOSIS — K651 Peritoneal abscess: Secondary | ICD-10-CM | POA: Diagnosis not present

## 2017-07-01 DIAGNOSIS — N186 End stage renal disease: Secondary | ICD-10-CM | POA: Diagnosis not present

## 2017-07-01 DIAGNOSIS — E8779 Other fluid overload: Secondary | ICD-10-CM | POA: Diagnosis not present

## 2017-07-01 DIAGNOSIS — D631 Anemia in chronic kidney disease: Secondary | ICD-10-CM | POA: Diagnosis not present

## 2017-07-04 ENCOUNTER — Other Ambulatory Visit: Payer: Self-pay | Admitting: *Deleted

## 2017-07-04 DIAGNOSIS — E8779 Other fluid overload: Secondary | ICD-10-CM | POA: Diagnosis not present

## 2017-07-04 DIAGNOSIS — N186 End stage renal disease: Secondary | ICD-10-CM | POA: Diagnosis not present

## 2017-07-04 DIAGNOSIS — D631 Anemia in chronic kidney disease: Secondary | ICD-10-CM | POA: Diagnosis not present

## 2017-07-04 NOTE — Patient Outreach (Signed)
[facility]) Care Management Post-Acute Care Coordination  07/04/2017  Shunna Mikaelian [birth year] [id number]  Met with Steffanie Dunn, discharge planner at facility, she reports patient is at facility for wound care.  She reports patient has an open wound and getting nursing care. Patient goes to dialysis on T,TH,Sat schedule.   Plan to follow up closer to discharge for any [facility] care management needs.  Royetta Crochet. Laymond Purser, RN, BSN, [place] Post-Acute Care Coordinator [phone]

## 2017-07-06 DIAGNOSIS — D631 Anemia in chronic kidney disease: Secondary | ICD-10-CM | POA: Diagnosis not present

## 2017-07-06 DIAGNOSIS — N186 End stage renal disease: Secondary | ICD-10-CM | POA: Diagnosis not present

## 2017-07-06 DIAGNOSIS — E8779 Other fluid overload: Secondary | ICD-10-CM | POA: Diagnosis not present

## 2017-07-07 DIAGNOSIS — L98499 Non-pressure chronic ulcer of skin of other sites with unspecified severity: Secondary | ICD-10-CM | POA: Diagnosis not present

## 2017-07-08 DIAGNOSIS — E8779 Other fluid overload: Secondary | ICD-10-CM | POA: Diagnosis not present

## 2017-07-08 DIAGNOSIS — N186 End stage renal disease: Secondary | ICD-10-CM | POA: Diagnosis not present

## 2017-07-08 DIAGNOSIS — D631 Anemia in chronic kidney disease: Secondary | ICD-10-CM | POA: Diagnosis not present

## 2017-07-10 DIAGNOSIS — D631 Anemia in chronic kidney disease: Secondary | ICD-10-CM | POA: Diagnosis not present

## 2017-07-10 DIAGNOSIS — E8779 Other fluid overload: Secondary | ICD-10-CM | POA: Diagnosis not present

## 2017-07-10 DIAGNOSIS — N186 End stage renal disease: Secondary | ICD-10-CM | POA: Diagnosis not present

## 2017-07-11 DIAGNOSIS — D631 Anemia in chronic kidney disease: Secondary | ICD-10-CM | POA: Diagnosis not present

## 2017-07-11 DIAGNOSIS — E8779 Other fluid overload: Secondary | ICD-10-CM | POA: Diagnosis not present

## 2017-07-11 DIAGNOSIS — N186 End stage renal disease: Secondary | ICD-10-CM | POA: Diagnosis not present

## 2017-07-12 DIAGNOSIS — M25562 Pain in left knee: Secondary | ICD-10-CM | POA: Diagnosis not present

## 2017-07-12 DIAGNOSIS — T8132XA Disruption of internal operation (surgical) wound, not elsewhere classified, initial encounter: Secondary | ICD-10-CM | POA: Diagnosis not present

## 2017-07-12 DIAGNOSIS — N186 End stage renal disease: Secondary | ICD-10-CM | POA: Diagnosis not present

## 2017-07-12 DIAGNOSIS — Y999 Unspecified external cause status: Secondary | ICD-10-CM | POA: Diagnosis not present

## 2017-07-12 DIAGNOSIS — D631 Anemia in chronic kidney disease: Secondary | ICD-10-CM | POA: Diagnosis not present

## 2017-07-12 DIAGNOSIS — X58XXXA Exposure to other specified factors, initial encounter: Secondary | ICD-10-CM | POA: Diagnosis not present

## 2017-07-12 DIAGNOSIS — T8131XA Disruption of external operation (surgical) wound, not elsewhere classified, initial encounter: Secondary | ICD-10-CM | POA: Diagnosis not present

## 2017-07-12 DIAGNOSIS — T8612 Kidney transplant failure: Secondary | ICD-10-CM | POA: Diagnosis not present

## 2017-07-12 DIAGNOSIS — I120 Hypertensive chronic kidney disease with stage 5 chronic kidney disease or end stage renal disease: Secondary | ICD-10-CM | POA: Diagnosis not present

## 2017-07-12 DIAGNOSIS — Z681 Body mass index (BMI) 19 or less, adult: Secondary | ICD-10-CM | POA: Diagnosis not present

## 2017-07-12 DIAGNOSIS — G8929 Other chronic pain: Secondary | ICD-10-CM | POA: Diagnosis not present

## 2017-07-12 DIAGNOSIS — R52 Pain, unspecified: Secondary | ICD-10-CM | POA: Diagnosis not present

## 2017-07-12 DIAGNOSIS — K913 Postprocedural intestinal obstruction, unspecified as to partial versus complete: Secondary | ICD-10-CM | POA: Diagnosis not present

## 2017-07-13 DIAGNOSIS — D631 Anemia in chronic kidney disease: Secondary | ICD-10-CM | POA: Diagnosis not present

## 2017-07-13 DIAGNOSIS — N186 End stage renal disease: Secondary | ICD-10-CM | POA: Diagnosis not present

## 2017-07-13 DIAGNOSIS — L98499 Non-pressure chronic ulcer of skin of other sites with unspecified severity: Secondary | ICD-10-CM | POA: Diagnosis not present

## 2017-07-13 DIAGNOSIS — E8779 Other fluid overload: Secondary | ICD-10-CM | POA: Diagnosis not present

## 2017-07-14 DIAGNOSIS — M6281 Muscle weakness (generalized): Secondary | ICD-10-CM | POA: Diagnosis not present

## 2017-07-14 DIAGNOSIS — Z7409 Other reduced mobility: Secondary | ICD-10-CM | POA: Diagnosis not present

## 2017-07-14 DIAGNOSIS — R52 Pain, unspecified: Secondary | ICD-10-CM | POA: Diagnosis not present

## 2017-07-14 DIAGNOSIS — R9431 Abnormal electrocardiogram [ECG] [EKG]: Secondary | ICD-10-CM | POA: Diagnosis not present

## 2017-07-14 DIAGNOSIS — G4089 Other seizures: Secondary | ICD-10-CM | POA: Diagnosis not present

## 2017-07-14 DIAGNOSIS — F339 Major depressive disorder, recurrent, unspecified: Secondary | ICD-10-CM | POA: Diagnosis not present

## 2017-07-14 DIAGNOSIS — Z4889 Encounter for other specified surgical aftercare: Secondary | ICD-10-CM | POA: Diagnosis not present

## 2017-07-14 DIAGNOSIS — D631 Anemia in chronic kidney disease: Secondary | ICD-10-CM | POA: Diagnosis not present

## 2017-07-14 DIAGNOSIS — I120 Hypertensive chronic kidney disease with stage 5 chronic kidney disease or end stage renal disease: Secondary | ICD-10-CM | POA: Diagnosis not present

## 2017-07-14 DIAGNOSIS — I517 Cardiomegaly: Secondary | ICD-10-CM | POA: Diagnosis not present

## 2017-07-14 DIAGNOSIS — X58XXXA Exposure to other specified factors, initial encounter: Secondary | ICD-10-CM | POA: Diagnosis not present

## 2017-07-14 DIAGNOSIS — Z940 Kidney transplant status: Secondary | ICD-10-CM | POA: Diagnosis not present

## 2017-07-14 DIAGNOSIS — T8612 Kidney transplant failure: Secondary | ICD-10-CM | POA: Diagnosis present

## 2017-07-14 DIAGNOSIS — N186 End stage renal disease: Secondary | ICD-10-CM | POA: Diagnosis not present

## 2017-07-14 DIAGNOSIS — A312 Disseminated mycobacterium avium-intracellulare complex (DMAC): Secondary | ICD-10-CM | POA: Diagnosis not present

## 2017-07-14 DIAGNOSIS — Z681 Body mass index (BMI) 19 or less, adult: Secondary | ICD-10-CM | POA: Diagnosis not present

## 2017-07-14 DIAGNOSIS — K219 Gastro-esophageal reflux disease without esophagitis: Secondary | ICD-10-CM | POA: Diagnosis present

## 2017-07-14 DIAGNOSIS — Z7952 Long term (current) use of systemic steroids: Secondary | ICD-10-CM | POA: Diagnosis not present

## 2017-07-14 DIAGNOSIS — E8770 Fluid overload, unspecified: Secondary | ICD-10-CM | POA: Diagnosis not present

## 2017-07-14 DIAGNOSIS — Y999 Unspecified external cause status: Secondary | ICD-10-CM | POA: Diagnosis not present

## 2017-07-14 DIAGNOSIS — R636 Underweight: Secondary | ICD-10-CM | POA: Diagnosis present

## 2017-07-14 DIAGNOSIS — T8131XS Disruption of external operation (surgical) wound, not elsewhere classified, sequela: Secondary | ICD-10-CM | POA: Diagnosis not present

## 2017-07-14 DIAGNOSIS — R000 Tachycardia, unspecified: Secondary | ICD-10-CM | POA: Diagnosis not present

## 2017-07-14 DIAGNOSIS — T8132XA Disruption of internal operation (surgical) wound, not elsewhere classified, initial encounter: Secondary | ICD-10-CM | POA: Diagnosis not present

## 2017-07-14 DIAGNOSIS — Z992 Dependence on renal dialysis: Secondary | ICD-10-CM | POA: Diagnosis not present

## 2017-07-14 DIAGNOSIS — Z86718 Personal history of other venous thrombosis and embolism: Secondary | ICD-10-CM | POA: Diagnosis not present

## 2017-07-14 DIAGNOSIS — R6889 Other general symptoms and signs: Secondary | ICD-10-CM | POA: Diagnosis not present

## 2017-07-14 DIAGNOSIS — T8130XA Disruption of wound, unspecified, initial encounter: Secondary | ICD-10-CM | POA: Diagnosis not present

## 2017-07-14 DIAGNOSIS — Z792 Long term (current) use of antibiotics: Secondary | ICD-10-CM | POA: Diagnosis not present

## 2017-07-14 DIAGNOSIS — L89159 Pressure ulcer of sacral region, unspecified stage: Secondary | ICD-10-CM | POA: Diagnosis present

## 2017-07-14 DIAGNOSIS — I10 Essential (primary) hypertension: Secondary | ICD-10-CM | POA: Diagnosis not present

## 2017-07-14 DIAGNOSIS — T8131XA Disruption of external operation (surgical) wound, not elsewhere classified, initial encounter: Secondary | ICD-10-CM | POA: Diagnosis not present

## 2017-07-14 DIAGNOSIS — R1010 Upper abdominal pain, unspecified: Secondary | ICD-10-CM | POA: Diagnosis not present

## 2017-07-14 DIAGNOSIS — K913 Postprocedural intestinal obstruction, unspecified as to partial versus complete: Secondary | ICD-10-CM | POA: Diagnosis not present

## 2017-07-14 DIAGNOSIS — M069 Rheumatoid arthritis, unspecified: Secondary | ICD-10-CM | POA: Diagnosis not present

## 2017-07-14 DIAGNOSIS — Z87440 Personal history of urinary (tract) infections: Secondary | ICD-10-CM | POA: Diagnosis not present

## 2017-07-14 DIAGNOSIS — M0689 Other specified rheumatoid arthritis, multiple sites: Secondary | ICD-10-CM | POA: Diagnosis present

## 2017-07-14 DIAGNOSIS — K660 Peritoneal adhesions (postprocedural) (postinfection): Secondary | ICD-10-CM | POA: Diagnosis not present

## 2017-07-18 DIAGNOSIS — A419 Sepsis, unspecified organism: Secondary | ICD-10-CM | POA: Diagnosis present

## 2017-07-18 DIAGNOSIS — K439 Ventral hernia without obstruction or gangrene: Secondary | ICD-10-CM | POA: Diagnosis not present

## 2017-07-18 DIAGNOSIS — M6281 Muscle weakness (generalized): Secondary | ICD-10-CM | POA: Diagnosis not present

## 2017-07-18 DIAGNOSIS — E876 Hypokalemia: Secondary | ICD-10-CM | POA: Diagnosis not present

## 2017-07-18 DIAGNOSIS — K660 Peritoneal adhesions (postprocedural) (postinfection): Secondary | ICD-10-CM | POA: Diagnosis present

## 2017-07-18 DIAGNOSIS — Z7409 Other reduced mobility: Secondary | ICD-10-CM | POA: Diagnosis not present

## 2017-07-18 DIAGNOSIS — R636 Underweight: Secondary | ICD-10-CM | POA: Diagnosis present

## 2017-07-18 DIAGNOSIS — I9589 Other hypotension: Secondary | ICD-10-CM | POA: Diagnosis not present

## 2017-07-18 DIAGNOSIS — M069 Rheumatoid arthritis, unspecified: Secondary | ICD-10-CM | POA: Diagnosis present

## 2017-07-18 DIAGNOSIS — E871 Hypo-osmolality and hyponatremia: Secondary | ICD-10-CM | POA: Diagnosis present

## 2017-07-18 DIAGNOSIS — E8779 Other fluid overload: Secondary | ICD-10-CM | POA: Diagnosis not present

## 2017-07-18 DIAGNOSIS — K432 Incisional hernia without obstruction or gangrene: Secondary | ICD-10-CM | POA: Diagnosis present

## 2017-07-18 DIAGNOSIS — D691 Qualitative platelet defects: Secondary | ICD-10-CM | POA: Diagnosis not present

## 2017-07-18 DIAGNOSIS — R34 Anuria and oliguria: Secondary | ICD-10-CM | POA: Diagnosis present

## 2017-07-18 DIAGNOSIS — K219 Gastro-esophageal reflux disease without esophagitis: Secondary | ICD-10-CM | POA: Diagnosis present

## 2017-07-18 DIAGNOSIS — L89151 Pressure ulcer of sacral region, stage 1: Secondary | ICD-10-CM | POA: Diagnosis present

## 2017-07-18 DIAGNOSIS — R109 Unspecified abdominal pain: Secondary | ICD-10-CM | POA: Diagnosis not present

## 2017-07-18 DIAGNOSIS — Z992 Dependence on renal dialysis: Secondary | ICD-10-CM | POA: Diagnosis not present

## 2017-07-18 DIAGNOSIS — I10 Essential (primary) hypertension: Secondary | ICD-10-CM | POA: Diagnosis not present

## 2017-07-18 DIAGNOSIS — T8611 Kidney transplant rejection: Secondary | ICD-10-CM | POA: Diagnosis present

## 2017-07-18 DIAGNOSIS — R52 Pain, unspecified: Secondary | ICD-10-CM | POA: Diagnosis not present

## 2017-07-18 DIAGNOSIS — N186 End stage renal disease: Secondary | ICD-10-CM | POA: Diagnosis not present

## 2017-07-18 DIAGNOSIS — L299 Pruritus, unspecified: Secondary | ICD-10-CM | POA: Diagnosis present

## 2017-07-18 DIAGNOSIS — Z681 Body mass index (BMI) 19 or less, adult: Secondary | ICD-10-CM | POA: Diagnosis not present

## 2017-07-18 DIAGNOSIS — R6889 Other general symptoms and signs: Secondary | ICD-10-CM | POA: Diagnosis not present

## 2017-07-18 DIAGNOSIS — G513 Clonic hemifacial spasm: Secondary | ICD-10-CM | POA: Diagnosis not present

## 2017-07-18 DIAGNOSIS — G4089 Other seizures: Secondary | ICD-10-CM | POA: Diagnosis not present

## 2017-07-18 DIAGNOSIS — D631 Anemia in chronic kidney disease: Secondary | ICD-10-CM | POA: Diagnosis present

## 2017-07-18 DIAGNOSIS — G245 Blepharospasm: Secondary | ICD-10-CM | POA: Diagnosis not present

## 2017-07-18 DIAGNOSIS — T8131XS Disruption of external operation (surgical) wound, not elsewhere classified, sequela: Secondary | ICD-10-CM | POA: Diagnosis not present

## 2017-07-18 DIAGNOSIS — D62 Acute posthemorrhagic anemia: Secondary | ICD-10-CM | POA: Diagnosis not present

## 2017-07-18 DIAGNOSIS — E875 Hyperkalemia: Secondary | ICD-10-CM | POA: Diagnosis present

## 2017-07-18 DIAGNOSIS — F339 Major depressive disorder, recurrent, unspecified: Secondary | ICD-10-CM | POA: Diagnosis not present

## 2017-07-18 DIAGNOSIS — G4700 Insomnia, unspecified: Secondary | ICD-10-CM | POA: Diagnosis present

## 2017-07-18 DIAGNOSIS — K91871 Postprocedural hematoma of a digestive system organ or structure following other procedure: Secondary | ICD-10-CM | POA: Diagnosis not present

## 2017-07-18 DIAGNOSIS — D899 Disorder involving the immune mechanism, unspecified: Secondary | ICD-10-CM | POA: Diagnosis not present

## 2017-07-18 DIAGNOSIS — I120 Hypertensive chronic kidney disease with stage 5 chronic kidney disease or end stage renal disease: Secondary | ICD-10-CM | POA: Diagnosis present

## 2017-07-18 DIAGNOSIS — K651 Peritoneal abscess: Secondary | ICD-10-CM | POA: Diagnosis present

## 2017-07-18 DIAGNOSIS — Z4889 Encounter for other specified surgical aftercare: Secondary | ICD-10-CM | POA: Diagnosis not present

## 2017-07-18 DIAGNOSIS — G8918 Other acute postprocedural pain: Secondary | ICD-10-CM | POA: Diagnosis not present

## 2017-07-18 DIAGNOSIS — A312 Disseminated mycobacterium avium-intracellulare complex (DMAC): Secondary | ICD-10-CM | POA: Diagnosis not present

## 2017-07-18 DIAGNOSIS — K56699 Other intestinal obstruction unspecified as to partial versus complete obstruction: Secondary | ICD-10-CM | POA: Diagnosis not present

## 2017-07-18 DIAGNOSIS — T8132XA Disruption of internal operation (surgical) wound, not elsewhere classified, initial encounter: Secondary | ICD-10-CM | POA: Diagnosis present

## 2017-07-18 DIAGNOSIS — Z940 Kidney transplant status: Secondary | ICD-10-CM | POA: Diagnosis not present

## 2017-07-18 DIAGNOSIS — T8612 Kidney transplant failure: Secondary | ICD-10-CM | POA: Diagnosis not present

## 2017-07-19 DIAGNOSIS — G513 Clonic hemifacial spasm: Secondary | ICD-10-CM | POA: Diagnosis not present

## 2017-07-19 DIAGNOSIS — G245 Blepharospasm: Secondary | ICD-10-CM | POA: Diagnosis not present

## 2017-07-20 DIAGNOSIS — D631 Anemia in chronic kidney disease: Secondary | ICD-10-CM | POA: Diagnosis not present

## 2017-07-20 DIAGNOSIS — N186 End stage renal disease: Secondary | ICD-10-CM | POA: Diagnosis not present

## 2017-07-20 DIAGNOSIS — E8779 Other fluid overload: Secondary | ICD-10-CM | POA: Diagnosis not present

## 2017-07-21 DIAGNOSIS — N186 End stage renal disease: Secondary | ICD-10-CM | POA: Diagnosis not present

## 2017-07-21 DIAGNOSIS — Z992 Dependence on renal dialysis: Secondary | ICD-10-CM | POA: Diagnosis not present

## 2017-07-22 DIAGNOSIS — E8779 Other fluid overload: Secondary | ICD-10-CM | POA: Diagnosis not present

## 2017-07-22 DIAGNOSIS — D631 Anemia in chronic kidney disease: Secondary | ICD-10-CM | POA: Diagnosis not present

## 2017-07-22 DIAGNOSIS — N186 End stage renal disease: Secondary | ICD-10-CM | POA: Diagnosis not present

## 2017-07-25 DIAGNOSIS — F339 Major depressive disorder, recurrent, unspecified: Secondary | ICD-10-CM | POA: Diagnosis not present

## 2017-07-25 DIAGNOSIS — Z7952 Long term (current) use of systemic steroids: Secondary | ICD-10-CM | POA: Diagnosis not present

## 2017-07-25 DIAGNOSIS — E46 Unspecified protein-calorie malnutrition: Secondary | ICD-10-CM | POA: Diagnosis not present

## 2017-07-25 DIAGNOSIS — Z992 Dependence on renal dialysis: Secondary | ICD-10-CM | POA: Diagnosis not present

## 2017-07-25 DIAGNOSIS — A310 Pulmonary mycobacterial infection: Secondary | ICD-10-CM | POA: Diagnosis not present

## 2017-07-25 DIAGNOSIS — B952 Enterococcus as the cause of diseases classified elsewhere: Secondary | ICD-10-CM | POA: Diagnosis not present

## 2017-07-25 DIAGNOSIS — Z9889 Other specified postprocedural states: Secondary | ICD-10-CM | POA: Diagnosis not present

## 2017-07-25 DIAGNOSIS — R52 Pain, unspecified: Secondary | ICD-10-CM | POA: Diagnosis not present

## 2017-07-25 DIAGNOSIS — G8918 Other acute postprocedural pain: Secondary | ICD-10-CM | POA: Diagnosis not present

## 2017-07-25 DIAGNOSIS — Z940 Kidney transplant status: Secondary | ICD-10-CM | POA: Diagnosis not present

## 2017-07-25 DIAGNOSIS — S36892A Contusion of other intra-abdominal organs, initial encounter: Secondary | ICD-10-CM | POA: Diagnosis not present

## 2017-07-25 DIAGNOSIS — T8131XS Disruption of external operation (surgical) wound, not elsewhere classified, sequela: Secondary | ICD-10-CM | POA: Diagnosis not present

## 2017-07-25 DIAGNOSIS — D631 Anemia in chronic kidney disease: Secondary | ICD-10-CM | POA: Diagnosis not present

## 2017-07-25 DIAGNOSIS — I10 Essential (primary) hypertension: Secondary | ICD-10-CM | POA: Diagnosis not present

## 2017-07-25 DIAGNOSIS — E871 Hypo-osmolality and hyponatremia: Secondary | ICD-10-CM | POA: Diagnosis not present

## 2017-07-25 DIAGNOSIS — K432 Incisional hernia without obstruction or gangrene: Secondary | ICD-10-CM | POA: Diagnosis not present

## 2017-07-25 DIAGNOSIS — R34 Anuria and oliguria: Secondary | ICD-10-CM | POA: Diagnosis present

## 2017-07-25 DIAGNOSIS — E876 Hypokalemia: Secondary | ICD-10-CM | POA: Diagnosis not present

## 2017-07-25 DIAGNOSIS — G40909 Epilepsy, unspecified, not intractable, without status epilepticus: Secondary | ICD-10-CM | POA: Diagnosis not present

## 2017-07-25 DIAGNOSIS — E878 Other disorders of electrolyte and fluid balance, not elsewhere classified: Secondary | ICD-10-CM | POA: Diagnosis not present

## 2017-07-25 DIAGNOSIS — T8132XA Disruption of internal operation (surgical) wound, not elsewhere classified, initial encounter: Secondary | ICD-10-CM | POA: Diagnosis not present

## 2017-07-25 DIAGNOSIS — L89151 Pressure ulcer of sacral region, stage 1: Secondary | ICD-10-CM | POA: Diagnosis not present

## 2017-07-25 DIAGNOSIS — D691 Qualitative platelet defects: Secondary | ICD-10-CM | POA: Diagnosis not present

## 2017-07-25 DIAGNOSIS — G4700 Insomnia, unspecified: Secondary | ICD-10-CM | POA: Diagnosis not present

## 2017-07-25 DIAGNOSIS — I9589 Other hypotension: Secondary | ICD-10-CM | POA: Diagnosis not present

## 2017-07-25 DIAGNOSIS — N186 End stage renal disease: Secondary | ICD-10-CM | POA: Diagnosis not present

## 2017-07-25 DIAGNOSIS — Z4889 Encounter for other specified surgical aftercare: Secondary | ICD-10-CM | POA: Diagnosis not present

## 2017-07-25 DIAGNOSIS — I120 Hypertensive chronic kidney disease with stage 5 chronic kidney disease or end stage renal disease: Secondary | ICD-10-CM | POA: Diagnosis not present

## 2017-07-25 DIAGNOSIS — A419 Sepsis, unspecified organism: Secondary | ICD-10-CM | POA: Diagnosis not present

## 2017-07-25 DIAGNOSIS — A312 Disseminated mycobacterium avium-intracellulare complex (DMAC): Secondary | ICD-10-CM | POA: Diagnosis not present

## 2017-07-25 DIAGNOSIS — Z4682 Encounter for fitting and adjustment of non-vascular catheter: Secondary | ICD-10-CM | POA: Diagnosis not present

## 2017-07-25 DIAGNOSIS — G4089 Other seizures: Secondary | ICD-10-CM | POA: Diagnosis not present

## 2017-07-25 DIAGNOSIS — K91871 Postprocedural hematoma of a digestive system organ or structure following other procedure: Secondary | ICD-10-CM | POA: Diagnosis not present

## 2017-07-25 DIAGNOSIS — D899 Disorder involving the immune mechanism, unspecified: Secondary | ICD-10-CM | POA: Diagnosis not present

## 2017-07-25 DIAGNOSIS — M6281 Muscle weakness (generalized): Secondary | ICD-10-CM | POA: Diagnosis not present

## 2017-07-25 DIAGNOSIS — K660 Peritoneal adhesions (postprocedural) (postinfection): Secondary | ICD-10-CM | POA: Diagnosis not present

## 2017-07-25 DIAGNOSIS — I959 Hypotension, unspecified: Secondary | ICD-10-CM | POA: Diagnosis not present

## 2017-07-25 DIAGNOSIS — K219 Gastro-esophageal reflux disease without esophagitis: Secondary | ICD-10-CM | POA: Diagnosis not present

## 2017-07-25 DIAGNOSIS — R636 Underweight: Secondary | ICD-10-CM | POA: Diagnosis present

## 2017-07-25 DIAGNOSIS — T8612 Kidney transplant failure: Secondary | ICD-10-CM | POA: Diagnosis not present

## 2017-07-25 DIAGNOSIS — E8770 Fluid overload, unspecified: Secondary | ICD-10-CM | POA: Diagnosis not present

## 2017-07-25 DIAGNOSIS — D62 Acute posthemorrhagic anemia: Secondary | ICD-10-CM | POA: Diagnosis not present

## 2017-07-25 DIAGNOSIS — K439 Ventral hernia without obstruction or gangrene: Secondary | ICD-10-CM | POA: Diagnosis not present

## 2017-07-25 DIAGNOSIS — K56699 Other intestinal obstruction unspecified as to partial versus complete obstruction: Secondary | ICD-10-CM | POA: Diagnosis not present

## 2017-07-25 DIAGNOSIS — T8131XA Disruption of external operation (surgical) wound, not elsewhere classified, initial encounter: Secondary | ICD-10-CM | POA: Diagnosis not present

## 2017-07-25 DIAGNOSIS — K661 Hemoperitoneum: Secondary | ICD-10-CM | POA: Diagnosis not present

## 2017-07-25 DIAGNOSIS — J90 Pleural effusion, not elsewhere classified: Secondary | ICD-10-CM | POA: Diagnosis not present

## 2017-07-25 DIAGNOSIS — Z79899 Other long term (current) drug therapy: Secondary | ICD-10-CM | POA: Diagnosis not present

## 2017-07-25 DIAGNOSIS — T8130XA Disruption of wound, unspecified, initial encounter: Secondary | ICD-10-CM | POA: Diagnosis not present

## 2017-07-25 DIAGNOSIS — K651 Peritoneal abscess: Secondary | ICD-10-CM | POA: Diagnosis not present

## 2017-07-25 DIAGNOSIS — M069 Rheumatoid arthritis, unspecified: Secondary | ICD-10-CM | POA: Diagnosis not present

## 2017-07-25 DIAGNOSIS — D72829 Elevated white blood cell count, unspecified: Secondary | ICD-10-CM | POA: Diagnosis not present

## 2017-07-25 DIAGNOSIS — Z681 Body mass index (BMI) 19 or less, adult: Secondary | ICD-10-CM | POA: Diagnosis not present

## 2017-07-25 DIAGNOSIS — E875 Hyperkalemia: Secondary | ICD-10-CM | POA: Diagnosis not present

## 2017-07-25 DIAGNOSIS — R739 Hyperglycemia, unspecified: Secondary | ICD-10-CM | POA: Diagnosis not present

## 2017-07-25 DIAGNOSIS — L299 Pruritus, unspecified: Secondary | ICD-10-CM | POA: Diagnosis present

## 2017-07-25 DIAGNOSIS — R109 Unspecified abdominal pain: Secondary | ICD-10-CM | POA: Diagnosis not present

## 2017-07-25 DIAGNOSIS — R569 Unspecified convulsions: Secondary | ICD-10-CM | POA: Diagnosis not present

## 2017-07-25 DIAGNOSIS — S301XXA Contusion of abdominal wall, initial encounter: Secondary | ICD-10-CM | POA: Diagnosis not present

## 2017-07-25 DIAGNOSIS — T8611 Kidney transplant rejection: Secondary | ICD-10-CM | POA: Diagnosis present

## 2017-07-25 DIAGNOSIS — J984 Other disorders of lung: Secondary | ICD-10-CM | POA: Diagnosis not present

## 2017-07-25 DIAGNOSIS — R9431 Abnormal electrocardiogram [ECG] [EKG]: Secondary | ICD-10-CM | POA: Diagnosis not present

## 2017-07-25 DIAGNOSIS — E8779 Other fluid overload: Secondary | ICD-10-CM | POA: Diagnosis not present

## 2017-07-30 DIAGNOSIS — R9431 Abnormal electrocardiogram [ECG] [EKG]: Secondary | ICD-10-CM | POA: Diagnosis not present

## 2017-08-02 DIAGNOSIS — M6281 Muscle weakness (generalized): Secondary | ICD-10-CM | POA: Diagnosis not present

## 2017-08-02 DIAGNOSIS — N186 End stage renal disease: Secondary | ICD-10-CM | POA: Diagnosis not present

## 2017-08-02 DIAGNOSIS — G4089 Other seizures: Secondary | ICD-10-CM | POA: Diagnosis not present

## 2017-08-02 DIAGNOSIS — E8779 Other fluid overload: Secondary | ICD-10-CM | POA: Diagnosis not present

## 2017-08-02 DIAGNOSIS — T8130XA Disruption of wound, unspecified, initial encounter: Secondary | ICD-10-CM | POA: Diagnosis not present

## 2017-08-02 DIAGNOSIS — Z4889 Encounter for other specified surgical aftercare: Secondary | ICD-10-CM | POA: Diagnosis not present

## 2017-08-02 DIAGNOSIS — T8131XS Disruption of external operation (surgical) wound, not elsewhere classified, sequela: Secondary | ICD-10-CM | POA: Diagnosis not present

## 2017-08-02 DIAGNOSIS — I953 Hypotension of hemodialysis: Secondary | ICD-10-CM | POA: Diagnosis present

## 2017-08-02 DIAGNOSIS — R05 Cough: Secondary | ICD-10-CM | POA: Diagnosis not present

## 2017-08-02 DIAGNOSIS — Z940 Kidney transplant status: Secondary | ICD-10-CM | POA: Diagnosis not present

## 2017-08-02 DIAGNOSIS — R1313 Dysphagia, pharyngeal phase: Secondary | ICD-10-CM | POA: Diagnosis not present

## 2017-08-02 DIAGNOSIS — T8612 Kidney transplant failure: Secondary | ICD-10-CM | POA: Diagnosis present

## 2017-08-02 DIAGNOSIS — F339 Major depressive disorder, recurrent, unspecified: Secondary | ICD-10-CM | POA: Diagnosis not present

## 2017-08-02 DIAGNOSIS — F458 Other somatoform disorders: Secondary | ICD-10-CM | POA: Diagnosis not present

## 2017-08-02 DIAGNOSIS — K651 Peritoneal abscess: Secondary | ICD-10-CM | POA: Diagnosis not present

## 2017-08-02 DIAGNOSIS — A312 Disseminated mycobacterium avium-intracellulare complex (DMAC): Secondary | ICD-10-CM | POA: Diagnosis not present

## 2017-08-02 DIAGNOSIS — I10 Essential (primary) hypertension: Secondary | ICD-10-CM | POA: Diagnosis not present

## 2017-08-02 DIAGNOSIS — D899 Disorder involving the immune mechanism, unspecified: Secondary | ICD-10-CM | POA: Diagnosis not present

## 2017-08-02 DIAGNOSIS — K219 Gastro-esophageal reflux disease without esophagitis: Secondary | ICD-10-CM | POA: Diagnosis present

## 2017-08-02 DIAGNOSIS — J302 Other seasonal allergic rhinitis: Secondary | ICD-10-CM | POA: Diagnosis not present

## 2017-08-02 DIAGNOSIS — S31109A Unspecified open wound of abdominal wall, unspecified quadrant without penetration into peritoneal cavity, initial encounter: Secondary | ICD-10-CM | POA: Diagnosis not present

## 2017-08-02 DIAGNOSIS — Z86718 Personal history of other venous thrombosis and embolism: Secondary | ICD-10-CM | POA: Diagnosis not present

## 2017-08-02 DIAGNOSIS — I120 Hypertensive chronic kidney disease with stage 5 chronic kidney disease or end stage renal disease: Secondary | ICD-10-CM | POA: Diagnosis present

## 2017-08-02 DIAGNOSIS — Z481 Encounter for planned postprocedural wound closure: Secondary | ICD-10-CM | POA: Diagnosis not present

## 2017-08-02 DIAGNOSIS — K668 Other specified disorders of peritoneum: Secondary | ICD-10-CM | POA: Diagnosis present

## 2017-08-02 DIAGNOSIS — E8770 Fluid overload, unspecified: Secondary | ICD-10-CM | POA: Diagnosis not present

## 2017-08-02 DIAGNOSIS — A1801 Tuberculosis of spine: Secondary | ICD-10-CM | POA: Diagnosis not present

## 2017-08-02 DIAGNOSIS — T8132XA Disruption of internal operation (surgical) wound, not elsewhere classified, initial encounter: Secondary | ICD-10-CM | POA: Diagnosis present

## 2017-08-02 DIAGNOSIS — N2581 Secondary hyperparathyroidism of renal origin: Secondary | ICD-10-CM | POA: Diagnosis not present

## 2017-08-02 DIAGNOSIS — Z79899 Other long term (current) drug therapy: Secondary | ICD-10-CM | POA: Diagnosis not present

## 2017-08-02 DIAGNOSIS — T8130XD Disruption of wound, unspecified, subsequent encounter: Secondary | ICD-10-CM | POA: Diagnosis not present

## 2017-08-02 DIAGNOSIS — Z87898 Personal history of other specified conditions: Secondary | ICD-10-CM | POA: Diagnosis not present

## 2017-08-02 DIAGNOSIS — E871 Hypo-osmolality and hyponatremia: Secondary | ICD-10-CM | POA: Diagnosis not present

## 2017-08-02 DIAGNOSIS — T8131XA Disruption of external operation (surgical) wound, not elsewhere classified, initial encounter: Secondary | ICD-10-CM | POA: Diagnosis not present

## 2017-08-02 DIAGNOSIS — D631 Anemia in chronic kidney disease: Secondary | ICD-10-CM | POA: Diagnosis present

## 2017-08-02 DIAGNOSIS — L98499 Non-pressure chronic ulcer of skin of other sites with unspecified severity: Secondary | ICD-10-CM | POA: Diagnosis not present

## 2017-08-02 DIAGNOSIS — Z4682 Encounter for fitting and adjustment of non-vascular catheter: Secondary | ICD-10-CM | POA: Diagnosis not present

## 2017-08-02 DIAGNOSIS — Z992 Dependence on renal dialysis: Secondary | ICD-10-CM | POA: Diagnosis not present

## 2017-08-02 DIAGNOSIS — M069 Rheumatoid arthritis, unspecified: Secondary | ICD-10-CM | POA: Diagnosis not present

## 2017-08-02 DIAGNOSIS — I129 Hypertensive chronic kidney disease with stage 1 through stage 4 chronic kidney disease, or unspecified chronic kidney disease: Secondary | ICD-10-CM | POA: Diagnosis not present

## 2017-08-03 DIAGNOSIS — L98499 Non-pressure chronic ulcer of skin of other sites with unspecified severity: Secondary | ICD-10-CM | POA: Diagnosis not present

## 2017-08-03 DIAGNOSIS — D631 Anemia in chronic kidney disease: Secondary | ICD-10-CM | POA: Diagnosis not present

## 2017-08-03 DIAGNOSIS — E8779 Other fluid overload: Secondary | ICD-10-CM | POA: Diagnosis not present

## 2017-08-03 DIAGNOSIS — N186 End stage renal disease: Secondary | ICD-10-CM | POA: Diagnosis not present

## 2017-08-05 DIAGNOSIS — N186 End stage renal disease: Secondary | ICD-10-CM | POA: Diagnosis not present

## 2017-08-05 DIAGNOSIS — E8779 Other fluid overload: Secondary | ICD-10-CM | POA: Diagnosis not present

## 2017-08-05 DIAGNOSIS — D631 Anemia in chronic kidney disease: Secondary | ICD-10-CM | POA: Diagnosis not present

## 2017-08-08 DIAGNOSIS — N186 End stage renal disease: Secondary | ICD-10-CM | POA: Diagnosis not present

## 2017-08-08 DIAGNOSIS — E8779 Other fluid overload: Secondary | ICD-10-CM | POA: Diagnosis not present

## 2017-08-08 DIAGNOSIS — D631 Anemia in chronic kidney disease: Secondary | ICD-10-CM | POA: Diagnosis not present

## 2017-08-09 DIAGNOSIS — N186 End stage renal disease: Secondary | ICD-10-CM | POA: Diagnosis not present

## 2017-08-09 DIAGNOSIS — D631 Anemia in chronic kidney disease: Secondary | ICD-10-CM | POA: Diagnosis not present

## 2017-08-09 DIAGNOSIS — E8779 Other fluid overload: Secondary | ICD-10-CM | POA: Diagnosis not present

## 2017-08-10 DIAGNOSIS — N186 End stage renal disease: Secondary | ICD-10-CM | POA: Diagnosis not present

## 2017-08-10 DIAGNOSIS — L98499 Non-pressure chronic ulcer of skin of other sites with unspecified severity: Secondary | ICD-10-CM | POA: Diagnosis not present

## 2017-08-10 DIAGNOSIS — E8779 Other fluid overload: Secondary | ICD-10-CM | POA: Diagnosis not present

## 2017-08-10 DIAGNOSIS — D631 Anemia in chronic kidney disease: Secondary | ICD-10-CM | POA: Diagnosis not present

## 2017-08-11 DIAGNOSIS — Z79899 Other long term (current) drug therapy: Secondary | ICD-10-CM | POA: Diagnosis not present

## 2017-08-11 DIAGNOSIS — Z940 Kidney transplant status: Secondary | ICD-10-CM | POA: Diagnosis not present

## 2017-08-11 DIAGNOSIS — K651 Peritoneal abscess: Secondary | ICD-10-CM | POA: Diagnosis not present

## 2017-08-11 DIAGNOSIS — D899 Disorder involving the immune mechanism, unspecified: Secondary | ICD-10-CM | POA: Diagnosis not present

## 2017-08-11 DIAGNOSIS — J302 Other seasonal allergic rhinitis: Secondary | ICD-10-CM | POA: Diagnosis not present

## 2017-08-11 DIAGNOSIS — F339 Major depressive disorder, recurrent, unspecified: Secondary | ICD-10-CM | POA: Diagnosis not present

## 2017-08-11 DIAGNOSIS — A312 Disseminated mycobacterium avium-intracellulare complex (DMAC): Secondary | ICD-10-CM | POA: Diagnosis not present

## 2017-08-11 DIAGNOSIS — N186 End stage renal disease: Secondary | ICD-10-CM | POA: Diagnosis not present

## 2017-08-11 DIAGNOSIS — T8130XD Disruption of wound, unspecified, subsequent encounter: Secondary | ICD-10-CM | POA: Diagnosis not present

## 2017-08-12 DIAGNOSIS — N186 End stage renal disease: Secondary | ICD-10-CM | POA: Diagnosis not present

## 2017-08-12 DIAGNOSIS — D631 Anemia in chronic kidney disease: Secondary | ICD-10-CM | POA: Diagnosis not present

## 2017-08-12 DIAGNOSIS — E8779 Other fluid overload: Secondary | ICD-10-CM | POA: Diagnosis not present

## 2017-08-14 DIAGNOSIS — Z4682 Encounter for fitting and adjustment of non-vascular catheter: Secondary | ICD-10-CM | POA: Diagnosis not present

## 2017-08-14 DIAGNOSIS — Z87898 Personal history of other specified conditions: Secondary | ICD-10-CM | POA: Diagnosis not present

## 2017-08-15 DIAGNOSIS — D631 Anemia in chronic kidney disease: Secondary | ICD-10-CM | POA: Diagnosis not present

## 2017-08-15 DIAGNOSIS — I129 Hypertensive chronic kidney disease with stage 1 through stage 4 chronic kidney disease, or unspecified chronic kidney disease: Secondary | ICD-10-CM | POA: Diagnosis not present

## 2017-08-15 DIAGNOSIS — N186 End stage renal disease: Secondary | ICD-10-CM | POA: Diagnosis not present

## 2017-08-15 DIAGNOSIS — Z86718 Personal history of other venous thrombosis and embolism: Secondary | ICD-10-CM | POA: Diagnosis not present

## 2017-08-15 DIAGNOSIS — Z481 Encounter for planned postprocedural wound closure: Secondary | ICD-10-CM | POA: Diagnosis not present

## 2017-08-15 DIAGNOSIS — E8779 Other fluid overload: Secondary | ICD-10-CM | POA: Diagnosis not present

## 2017-08-16 ENCOUNTER — Other Ambulatory Visit: Payer: Self-pay | Admitting: *Deleted

## 2017-08-16 DIAGNOSIS — R1313 Dysphagia, pharyngeal phase: Secondary | ICD-10-CM | POA: Diagnosis not present

## 2017-08-16 DIAGNOSIS — R05 Cough: Secondary | ICD-10-CM | POA: Diagnosis not present

## 2017-08-16 DIAGNOSIS — F458 Other somatoform disorders: Secondary | ICD-10-CM | POA: Diagnosis not present

## 2017-08-16 NOTE — Patient Outreach (Signed)
[facility]) Care Management  08/16/2017  Anasophia Pecor [birth year] [id number]  Attempted to meet with patient at facility, staff states patient is out of facility at MD appointment.   Met with Marita [place], SW and discharge planner at facility, she states patient may discharge next week. She was getting bid dressing changes, but anticipates MD may put wound vac back on site.  She will set up home care for patient.   Plan to attempt to meet patient to review [facility] care management services for patient.  Royetta Crochet. Laymond Purser, RN, BSN, [place] [phone]) Business Cell  [phone]) Toll Free Office

## 2017-08-17 DIAGNOSIS — E8779 Other fluid overload: Secondary | ICD-10-CM | POA: Diagnosis not present

## 2017-08-17 DIAGNOSIS — D631 Anemia in chronic kidney disease: Secondary | ICD-10-CM | POA: Diagnosis not present

## 2017-08-17 DIAGNOSIS — N186 End stage renal disease: Secondary | ICD-10-CM | POA: Diagnosis not present

## 2017-08-19 DIAGNOSIS — E8779 Other fluid overload: Secondary | ICD-10-CM | POA: Diagnosis not present

## 2017-08-19 DIAGNOSIS — D631 Anemia in chronic kidney disease: Secondary | ICD-10-CM | POA: Diagnosis not present

## 2017-08-19 DIAGNOSIS — N186 End stage renal disease: Secondary | ICD-10-CM | POA: Diagnosis not present

## 2017-08-20 DIAGNOSIS — Z992 Dependence on renal dialysis: Secondary | ICD-10-CM | POA: Diagnosis not present

## 2017-08-20 DIAGNOSIS — N186 End stage renal disease: Secondary | ICD-10-CM | POA: Diagnosis not present

## 2017-08-22 DIAGNOSIS — N186 End stage renal disease: Secondary | ICD-10-CM | POA: Diagnosis not present

## 2017-08-22 DIAGNOSIS — I10 Essential (primary) hypertension: Secondary | ICD-10-CM | POA: Diagnosis not present

## 2017-08-22 DIAGNOSIS — N2581 Secondary hyperparathyroidism of renal origin: Secondary | ICD-10-CM | POA: Diagnosis not present

## 2017-08-22 DIAGNOSIS — T8131XA Disruption of external operation (surgical) wound, not elsewhere classified, initial encounter: Secondary | ICD-10-CM | POA: Diagnosis not present

## 2017-08-22 DIAGNOSIS — D631 Anemia in chronic kidney disease: Secondary | ICD-10-CM | POA: Diagnosis not present

## 2017-08-24 DIAGNOSIS — N2581 Secondary hyperparathyroidism of renal origin: Secondary | ICD-10-CM | POA: Diagnosis not present

## 2017-08-24 DIAGNOSIS — D631 Anemia in chronic kidney disease: Secondary | ICD-10-CM | POA: Diagnosis not present

## 2017-08-24 DIAGNOSIS — I10 Essential (primary) hypertension: Secondary | ICD-10-CM | POA: Diagnosis not present

## 2017-08-24 DIAGNOSIS — N186 End stage renal disease: Secondary | ICD-10-CM | POA: Diagnosis not present

## 2017-08-26 DIAGNOSIS — N2581 Secondary hyperparathyroidism of renal origin: Secondary | ICD-10-CM | POA: Diagnosis not present

## 2017-08-26 DIAGNOSIS — I10 Essential (primary) hypertension: Secondary | ICD-10-CM | POA: Diagnosis not present

## 2017-08-26 DIAGNOSIS — N186 End stage renal disease: Secondary | ICD-10-CM | POA: Diagnosis not present

## 2017-08-26 DIAGNOSIS — D631 Anemia in chronic kidney disease: Secondary | ICD-10-CM | POA: Diagnosis not present

## 2017-08-28 DIAGNOSIS — D631 Anemia in chronic kidney disease: Secondary | ICD-10-CM | POA: Diagnosis not present

## 2017-08-28 DIAGNOSIS — I10 Essential (primary) hypertension: Secondary | ICD-10-CM | POA: Diagnosis not present

## 2017-08-28 DIAGNOSIS — N186 End stage renal disease: Secondary | ICD-10-CM | POA: Diagnosis not present

## 2017-08-28 DIAGNOSIS — N2581 Secondary hyperparathyroidism of renal origin: Secondary | ICD-10-CM | POA: Diagnosis not present

## 2017-08-29 DIAGNOSIS — N186 End stage renal disease: Secondary | ICD-10-CM | POA: Diagnosis not present

## 2017-08-29 DIAGNOSIS — N2581 Secondary hyperparathyroidism of renal origin: Secondary | ICD-10-CM | POA: Diagnosis not present

## 2017-08-29 DIAGNOSIS — I10 Essential (primary) hypertension: Secondary | ICD-10-CM | POA: Diagnosis not present

## 2017-08-29 DIAGNOSIS — T8130XD Disruption of wound, unspecified, subsequent encounter: Secondary | ICD-10-CM | POA: Diagnosis not present

## 2017-08-29 DIAGNOSIS — D631 Anemia in chronic kidney disease: Secondary | ICD-10-CM | POA: Diagnosis not present

## 2017-08-30 ENCOUNTER — Other Ambulatory Visit: Payer: Self-pay | Admitting: *Deleted

## 2017-08-30 DIAGNOSIS — A1801 Tuberculosis of spine: Secondary | ICD-10-CM | POA: Diagnosis not present

## 2017-08-30 DIAGNOSIS — Z4682 Encounter for fitting and adjustment of non-vascular catheter: Secondary | ICD-10-CM | POA: Diagnosis not present

## 2017-08-30 NOTE — Patient Outreach (Signed)
[facility]) Care Management  08/30/2017  Shiquita Collignon [birth year] [id number]   Met with Marita [place], SW at facility She reports patient had been issued [facility] by facility, appealed and won.  So facility has not issued new [place] as of yet.  She states patient is getting wound care at this time.  Attempted to visit with patient, she was out of the facility.  Will attempt to visit at next facility visit. Royetta Crochet. Laymond Purser, RN, BSN, [place] [phone]) Business Cell  [phone]) Toll Free Office

## 2017-08-31 DIAGNOSIS — N186 End stage renal disease: Secondary | ICD-10-CM | POA: Diagnosis not present

## 2017-08-31 DIAGNOSIS — I10 Essential (primary) hypertension: Secondary | ICD-10-CM | POA: Diagnosis not present

## 2017-08-31 DIAGNOSIS — F339 Major depressive disorder, recurrent, unspecified: Secondary | ICD-10-CM | POA: Diagnosis not present

## 2017-08-31 DIAGNOSIS — D631 Anemia in chronic kidney disease: Secondary | ICD-10-CM | POA: Diagnosis not present

## 2017-08-31 DIAGNOSIS — Z992 Dependence on renal dialysis: Secondary | ICD-10-CM | POA: Diagnosis not present

## 2017-08-31 DIAGNOSIS — N2581 Secondary hyperparathyroidism of renal origin: Secondary | ICD-10-CM | POA: Diagnosis not present

## 2017-09-02 DIAGNOSIS — D631 Anemia in chronic kidney disease: Secondary | ICD-10-CM | POA: Diagnosis not present

## 2017-09-02 DIAGNOSIS — I10 Essential (primary) hypertension: Secondary | ICD-10-CM | POA: Diagnosis not present

## 2017-09-02 DIAGNOSIS — N186 End stage renal disease: Secondary | ICD-10-CM | POA: Diagnosis not present

## 2017-09-02 DIAGNOSIS — N2581 Secondary hyperparathyroidism of renal origin: Secondary | ICD-10-CM | POA: Diagnosis not present

## 2017-09-05 DIAGNOSIS — M069 Rheumatoid arthritis, unspecified: Secondary | ICD-10-CM | POA: Diagnosis not present

## 2017-09-05 DIAGNOSIS — E878 Other disorders of electrolyte and fluid balance, not elsewhere classified: Secondary | ICD-10-CM | POA: Diagnosis not present

## 2017-09-05 DIAGNOSIS — G40909 Epilepsy, unspecified, not intractable, without status epilepticus: Secondary | ICD-10-CM | POA: Diagnosis not present

## 2017-09-05 DIAGNOSIS — R188 Other ascites: Secondary | ICD-10-CM | POA: Diagnosis not present

## 2017-09-05 DIAGNOSIS — A312 Disseminated mycobacterium avium-intracellulare complex (DMAC): Secondary | ICD-10-CM | POA: Diagnosis not present

## 2017-09-05 DIAGNOSIS — T8612 Kidney transplant failure: Secondary | ICD-10-CM | POA: Diagnosis present

## 2017-09-05 DIAGNOSIS — Z940 Kidney transplant status: Secondary | ICD-10-CM | POA: Diagnosis not present

## 2017-09-05 DIAGNOSIS — D631 Anemia in chronic kidney disease: Secondary | ICD-10-CM | POA: Diagnosis not present

## 2017-09-05 DIAGNOSIS — J984 Other disorders of lung: Secondary | ICD-10-CM | POA: Diagnosis not present

## 2017-09-05 DIAGNOSIS — R739 Hyperglycemia, unspecified: Secondary | ICD-10-CM | POA: Diagnosis not present

## 2017-09-05 DIAGNOSIS — E46 Unspecified protein-calorie malnutrition: Secondary | ICD-10-CM | POA: Diagnosis not present

## 2017-09-05 DIAGNOSIS — I120 Hypertensive chronic kidney disease with stage 5 chronic kidney disease or end stage renal disease: Secondary | ICD-10-CM | POA: Diagnosis not present

## 2017-09-05 DIAGNOSIS — N2581 Secondary hyperparathyroidism of renal origin: Secondary | ICD-10-CM | POA: Diagnosis not present

## 2017-09-05 DIAGNOSIS — Z79899 Other long term (current) drug therapy: Secondary | ICD-10-CM | POA: Diagnosis not present

## 2017-09-05 DIAGNOSIS — I953 Hypotension of hemodialysis: Secondary | ICD-10-CM | POA: Diagnosis present

## 2017-09-05 DIAGNOSIS — N186 End stage renal disease: Secondary | ICD-10-CM | POA: Diagnosis not present

## 2017-09-05 DIAGNOSIS — T8131XA Disruption of external operation (surgical) wound, not elsewhere classified, initial encounter: Secondary | ICD-10-CM | POA: Diagnosis not present

## 2017-09-05 DIAGNOSIS — S31109A Unspecified open wound of abdominal wall, unspecified quadrant without penetration into peritoneal cavity, initial encounter: Secondary | ICD-10-CM | POA: Diagnosis not present

## 2017-09-05 DIAGNOSIS — Z86718 Personal history of other venous thrombosis and embolism: Secondary | ICD-10-CM | POA: Diagnosis not present

## 2017-09-05 DIAGNOSIS — I82401 Acute embolism and thrombosis of unspecified deep veins of right lower extremity: Secondary | ICD-10-CM | POA: Diagnosis not present

## 2017-09-05 DIAGNOSIS — I824Z1 Acute embolism and thrombosis of unspecified deep veins of right distal lower extremity: Secondary | ICD-10-CM | POA: Diagnosis not present

## 2017-09-05 DIAGNOSIS — K668 Other specified disorders of peritoneum: Secondary | ICD-10-CM | POA: Diagnosis present

## 2017-09-05 DIAGNOSIS — T8130XD Disruption of wound, unspecified, subsequent encounter: Secondary | ICD-10-CM | POA: Diagnosis not present

## 2017-09-05 DIAGNOSIS — E871 Hypo-osmolality and hyponatremia: Secondary | ICD-10-CM | POA: Diagnosis not present

## 2017-09-05 DIAGNOSIS — Z992 Dependence on renal dialysis: Secondary | ICD-10-CM | POA: Diagnosis not present

## 2017-09-05 DIAGNOSIS — K219 Gastro-esophageal reflux disease without esophagitis: Secondary | ICD-10-CM | POA: Diagnosis present

## 2017-09-05 DIAGNOSIS — T8132XA Disruption of internal operation (surgical) wound, not elsewhere classified, initial encounter: Secondary | ICD-10-CM | POA: Diagnosis present

## 2017-09-05 DIAGNOSIS — R569 Unspecified convulsions: Secondary | ICD-10-CM | POA: Diagnosis not present

## 2017-09-05 DIAGNOSIS — I10 Essential (primary) hypertension: Secondary | ICD-10-CM | POA: Diagnosis not present

## 2017-09-05 DIAGNOSIS — M319 Necrotizing vasculopathy, unspecified: Secondary | ICD-10-CM | POA: Diagnosis not present

## 2017-09-06 ENCOUNTER — Other Ambulatory Visit: Payer: Self-pay | Admitting: *Deleted

## 2017-09-06 NOTE — Patient Outreach (Signed)
[facility]) Care Management  09/06/2017  Kathleen [name] 11/02/1966 [id number]   Unable to meet with patient at facility, she was admitted to [facility] yesterday from MD office for procedure. Patient may return to facility upon hospital discharge.  Plan Will attempt visit if patient returns to this RNCM assigned facility.  Royetta Crochet. Laymond Purser, RN, BSN, [place] [phone]) Business Cell  [phone]) Toll Free Office

## 2017-09-14 DIAGNOSIS — N186 End stage renal disease: Secondary | ICD-10-CM | POA: Diagnosis not present

## 2017-09-14 DIAGNOSIS — A312 Disseminated mycobacterium avium-intracellulare complex (DMAC): Secondary | ICD-10-CM | POA: Diagnosis not present

## 2017-09-14 DIAGNOSIS — I10 Essential (primary) hypertension: Secondary | ICD-10-CM | POA: Diagnosis not present

## 2017-09-14 DIAGNOSIS — D631 Anemia in chronic kidney disease: Secondary | ICD-10-CM | POA: Diagnosis not present

## 2017-09-14 DIAGNOSIS — N2581 Secondary hyperparathyroidism of renal origin: Secondary | ICD-10-CM | POA: Diagnosis not present

## 2017-09-14 DIAGNOSIS — T8130XA Disruption of wound, unspecified, initial encounter: Secondary | ICD-10-CM | POA: Diagnosis not present

## 2017-09-15 DIAGNOSIS — Z79899 Other long term (current) drug therapy: Secondary | ICD-10-CM | POA: Diagnosis not present

## 2017-09-15 DIAGNOSIS — Z8674 Personal history of sudden cardiac arrest: Secondary | ICD-10-CM | POA: Diagnosis not present

## 2017-09-15 DIAGNOSIS — R000 Tachycardia, unspecified: Secondary | ICD-10-CM | POA: Diagnosis not present

## 2017-09-15 DIAGNOSIS — I120 Hypertensive chronic kidney disease with stage 5 chronic kidney disease or end stage renal disease: Secondary | ICD-10-CM | POA: Diagnosis not present

## 2017-09-15 DIAGNOSIS — T8131XA Disruption of external operation (surgical) wound, not elsewhere classified, initial encounter: Secondary | ICD-10-CM | POA: Diagnosis not present

## 2017-09-15 DIAGNOSIS — Z5181 Encounter for therapeutic drug level monitoring: Secondary | ICD-10-CM | POA: Diagnosis not present

## 2017-09-15 DIAGNOSIS — Z4682 Encounter for fitting and adjustment of non-vascular catheter: Secondary | ICD-10-CM | POA: Diagnosis not present

## 2017-09-15 DIAGNOSIS — Z992 Dependence on renal dialysis: Secondary | ICD-10-CM | POA: Diagnosis not present

## 2017-09-15 DIAGNOSIS — N186 End stage renal disease: Secondary | ICD-10-CM | POA: Diagnosis not present

## 2017-09-15 DIAGNOSIS — R9401 Abnormal electroencephalogram [EEG]: Secondary | ICD-10-CM | POA: Diagnosis not present

## 2017-09-16 DIAGNOSIS — I10 Essential (primary) hypertension: Secondary | ICD-10-CM | POA: Diagnosis not present

## 2017-09-16 DIAGNOSIS — T8130XA Disruption of wound, unspecified, initial encounter: Secondary | ICD-10-CM | POA: Diagnosis not present

## 2017-09-16 DIAGNOSIS — D631 Anemia in chronic kidney disease: Secondary | ICD-10-CM | POA: Diagnosis not present

## 2017-09-16 DIAGNOSIS — N2581 Secondary hyperparathyroidism of renal origin: Secondary | ICD-10-CM | POA: Diagnosis not present

## 2017-09-16 DIAGNOSIS — A312 Disseminated mycobacterium avium-intracellulare complex (DMAC): Secondary | ICD-10-CM | POA: Diagnosis not present

## 2017-09-16 DIAGNOSIS — N186 End stage renal disease: Secondary | ICD-10-CM | POA: Diagnosis not present

## 2017-09-18 DIAGNOSIS — A312 Disseminated mycobacterium avium-intracellulare complex (DMAC): Secondary | ICD-10-CM | POA: Diagnosis not present

## 2017-09-18 DIAGNOSIS — T8130XA Disruption of wound, unspecified, initial encounter: Secondary | ICD-10-CM | POA: Diagnosis not present

## 2017-09-19 DIAGNOSIS — N186 End stage renal disease: Secondary | ICD-10-CM | POA: Diagnosis not present

## 2017-09-19 DIAGNOSIS — T8131XA Disruption of external operation (surgical) wound, not elsewhere classified, initial encounter: Secondary | ICD-10-CM | POA: Diagnosis not present

## 2017-09-19 DIAGNOSIS — D631 Anemia in chronic kidney disease: Secondary | ICD-10-CM | POA: Diagnosis not present

## 2017-09-19 DIAGNOSIS — M069 Rheumatoid arthritis, unspecified: Secondary | ICD-10-CM | POA: Diagnosis not present

## 2017-09-19 DIAGNOSIS — R1084 Generalized abdominal pain: Secondary | ICD-10-CM | POA: Diagnosis not present

## 2017-09-19 DIAGNOSIS — K56691 Other complete intestinal obstruction: Secondary | ICD-10-CM | POA: Diagnosis not present

## 2017-09-19 DIAGNOSIS — I10 Essential (primary) hypertension: Secondary | ICD-10-CM | POA: Diagnosis not present

## 2017-09-19 DIAGNOSIS — T8611 Kidney transplant rejection: Secondary | ICD-10-CM | POA: Diagnosis not present

## 2017-09-19 DIAGNOSIS — T8189XA Other complications of procedures, not elsewhere classified, initial encounter: Secondary | ICD-10-CM | POA: Diagnosis not present

## 2017-09-19 DIAGNOSIS — R109 Unspecified abdominal pain: Secondary | ICD-10-CM | POA: Diagnosis not present

## 2017-09-19 DIAGNOSIS — I120 Hypertensive chronic kidney disease with stage 5 chronic kidney disease or end stage renal disease: Secondary | ICD-10-CM | POA: Diagnosis not present

## 2017-09-19 DIAGNOSIS — K56609 Unspecified intestinal obstruction, unspecified as to partial versus complete obstruction: Secondary | ICD-10-CM | POA: Diagnosis not present

## 2017-09-19 DIAGNOSIS — K565 Intestinal adhesions [bands], unspecified as to partial versus complete obstruction: Secondary | ICD-10-CM | POA: Diagnosis not present

## 2017-09-19 DIAGNOSIS — N2581 Secondary hyperparathyroidism of renal origin: Secondary | ICD-10-CM | POA: Diagnosis not present

## 2017-09-20 DIAGNOSIS — Z992 Dependence on renal dialysis: Secondary | ICD-10-CM | POA: Diagnosis not present

## 2017-09-20 DIAGNOSIS — N186 End stage renal disease: Secondary | ICD-10-CM | POA: Diagnosis not present

## 2017-09-21 DIAGNOSIS — Z7952 Long term (current) use of systemic steroids: Secondary | ICD-10-CM | POA: Diagnosis not present

## 2017-09-21 DIAGNOSIS — Z8759 Personal history of other complications of pregnancy, childbirth and the puerperium: Secondary | ICD-10-CM | POA: Diagnosis not present

## 2017-09-21 DIAGNOSIS — Z86718 Personal history of other venous thrombosis and embolism: Secondary | ICD-10-CM | POA: Diagnosis not present

## 2017-09-21 DIAGNOSIS — E871 Hypo-osmolality and hyponatremia: Secondary | ICD-10-CM | POA: Diagnosis not present

## 2017-09-21 DIAGNOSIS — Z940 Kidney transplant status: Secondary | ICD-10-CM | POA: Diagnosis not present

## 2017-09-21 DIAGNOSIS — K219 Gastro-esophageal reflux disease without esophagitis: Secondary | ICD-10-CM | POA: Diagnosis present

## 2017-09-21 DIAGNOSIS — K565 Intestinal adhesions [bands], unspecified as to partial versus complete obstruction: Secondary | ICD-10-CM | POA: Diagnosis present

## 2017-09-21 DIAGNOSIS — R109 Unspecified abdominal pain: Secondary | ICD-10-CM | POA: Diagnosis not present

## 2017-09-21 DIAGNOSIS — Z992 Dependence on renal dialysis: Secondary | ICD-10-CM | POA: Diagnosis not present

## 2017-09-21 DIAGNOSIS — M069 Rheumatoid arthritis, unspecified: Secondary | ICD-10-CM | POA: Diagnosis not present

## 2017-09-21 DIAGNOSIS — D631 Anemia in chronic kidney disease: Secondary | ICD-10-CM | POA: Diagnosis not present

## 2017-09-21 DIAGNOSIS — T8611 Kidney transplant rejection: Secondary | ICD-10-CM | POA: Diagnosis present

## 2017-09-21 DIAGNOSIS — E8770 Fluid overload, unspecified: Secondary | ICD-10-CM | POA: Diagnosis not present

## 2017-09-21 DIAGNOSIS — E041 Nontoxic single thyroid nodule: Secondary | ICD-10-CM | POA: Diagnosis not present

## 2017-09-21 DIAGNOSIS — I120 Hypertensive chronic kidney disease with stage 5 chronic kidney disease or end stage renal disease: Secondary | ICD-10-CM | POA: Diagnosis not present

## 2017-09-21 DIAGNOSIS — A312 Disseminated mycobacterium avium-intracellulare complex (DMAC): Secondary | ICD-10-CM | POA: Diagnosis not present

## 2017-09-21 DIAGNOSIS — N186 End stage renal disease: Secondary | ICD-10-CM | POA: Diagnosis not present

## 2017-09-21 DIAGNOSIS — I10 Essential (primary) hypertension: Secondary | ICD-10-CM | POA: Diagnosis not present

## 2017-09-23 DIAGNOSIS — I10 Essential (primary) hypertension: Secondary | ICD-10-CM | POA: Diagnosis not present

## 2017-09-23 DIAGNOSIS — N186 End stage renal disease: Secondary | ICD-10-CM | POA: Diagnosis not present

## 2017-09-23 DIAGNOSIS — D631 Anemia in chronic kidney disease: Secondary | ICD-10-CM | POA: Diagnosis not present

## 2017-09-23 DIAGNOSIS — E8779 Other fluid overload: Secondary | ICD-10-CM | POA: Diagnosis not present

## 2017-09-23 DIAGNOSIS — D509 Iron deficiency anemia, unspecified: Secondary | ICD-10-CM | POA: Diagnosis not present

## 2017-09-23 DIAGNOSIS — L03115 Cellulitis of right lower limb: Secondary | ICD-10-CM | POA: Diagnosis not present

## 2017-09-23 DIAGNOSIS — R7881 Bacteremia: Secondary | ICD-10-CM | POA: Diagnosis not present

## 2017-09-25 DIAGNOSIS — A312 Disseminated mycobacterium avium-intracellulare complex (DMAC): Secondary | ICD-10-CM | POA: Diagnosis not present

## 2017-09-25 DIAGNOSIS — T8130XA Disruption of wound, unspecified, initial encounter: Secondary | ICD-10-CM | POA: Diagnosis not present

## 2017-09-26 DIAGNOSIS — N186 End stage renal disease: Secondary | ICD-10-CM | POA: Diagnosis not present

## 2017-09-26 DIAGNOSIS — S31109D Unspecified open wound of abdominal wall, unspecified quadrant without penetration into peritoneal cavity, subsequent encounter: Secondary | ICD-10-CM | POA: Diagnosis not present

## 2017-09-26 DIAGNOSIS — E8779 Other fluid overload: Secondary | ICD-10-CM | POA: Diagnosis not present

## 2017-09-26 DIAGNOSIS — R7881 Bacteremia: Secondary | ICD-10-CM | POA: Diagnosis not present

## 2017-09-26 DIAGNOSIS — D631 Anemia in chronic kidney disease: Secondary | ICD-10-CM | POA: Diagnosis not present

## 2017-09-26 DIAGNOSIS — L03115 Cellulitis of right lower limb: Secondary | ICD-10-CM | POA: Diagnosis not present

## 2017-09-26 DIAGNOSIS — I10 Essential (primary) hypertension: Secondary | ICD-10-CM | POA: Diagnosis not present

## 2017-09-28 DIAGNOSIS — I10 Essential (primary) hypertension: Secondary | ICD-10-CM | POA: Diagnosis not present

## 2017-09-28 DIAGNOSIS — T8130XA Disruption of wound, unspecified, initial encounter: Secondary | ICD-10-CM | POA: Diagnosis not present

## 2017-09-28 DIAGNOSIS — A312 Disseminated mycobacterium avium-intracellulare complex (DMAC): Secondary | ICD-10-CM | POA: Diagnosis not present

## 2017-09-28 DIAGNOSIS — D631 Anemia in chronic kidney disease: Secondary | ICD-10-CM | POA: Diagnosis not present

## 2017-09-28 DIAGNOSIS — L03115 Cellulitis of right lower limb: Secondary | ICD-10-CM | POA: Diagnosis not present

## 2017-09-28 DIAGNOSIS — R7881 Bacteremia: Secondary | ICD-10-CM | POA: Diagnosis not present

## 2017-09-28 DIAGNOSIS — N186 End stage renal disease: Secondary | ICD-10-CM | POA: Diagnosis not present

## 2017-09-28 DIAGNOSIS — E8779 Other fluid overload: Secondary | ICD-10-CM | POA: Diagnosis not present

## 2017-09-30 DIAGNOSIS — D631 Anemia in chronic kidney disease: Secondary | ICD-10-CM | POA: Diagnosis not present

## 2017-09-30 DIAGNOSIS — I10 Essential (primary) hypertension: Secondary | ICD-10-CM | POA: Diagnosis not present

## 2017-09-30 DIAGNOSIS — R7881 Bacteremia: Secondary | ICD-10-CM | POA: Diagnosis not present

## 2017-09-30 DIAGNOSIS — E8779 Other fluid overload: Secondary | ICD-10-CM | POA: Diagnosis not present

## 2017-09-30 DIAGNOSIS — N186 End stage renal disease: Secondary | ICD-10-CM | POA: Diagnosis not present

## 2017-09-30 DIAGNOSIS — L03115 Cellulitis of right lower limb: Secondary | ICD-10-CM | POA: Diagnosis not present

## 2017-10-02 DIAGNOSIS — T8130XA Disruption of wound, unspecified, initial encounter: Secondary | ICD-10-CM | POA: Diagnosis not present

## 2017-10-02 DIAGNOSIS — A312 Disseminated mycobacterium avium-intracellulare complex (DMAC): Secondary | ICD-10-CM | POA: Diagnosis not present

## 2017-10-03 DIAGNOSIS — E8779 Other fluid overload: Secondary | ICD-10-CM | POA: Diagnosis not present

## 2017-10-03 DIAGNOSIS — D631 Anemia in chronic kidney disease: Secondary | ICD-10-CM | POA: Diagnosis not present

## 2017-10-03 DIAGNOSIS — N186 End stage renal disease: Secondary | ICD-10-CM | POA: Diagnosis not present

## 2017-10-03 DIAGNOSIS — I10 Essential (primary) hypertension: Secondary | ICD-10-CM | POA: Diagnosis not present

## 2017-10-03 DIAGNOSIS — L03115 Cellulitis of right lower limb: Secondary | ICD-10-CM | POA: Diagnosis not present

## 2017-10-03 DIAGNOSIS — R7881 Bacteremia: Secondary | ICD-10-CM | POA: Diagnosis not present

## 2017-10-04 DIAGNOSIS — I10 Essential (primary) hypertension: Secondary | ICD-10-CM | POA: Diagnosis not present

## 2017-10-04 DIAGNOSIS — L03115 Cellulitis of right lower limb: Secondary | ICD-10-CM | POA: Diagnosis not present

## 2017-10-04 DIAGNOSIS — R7881 Bacteremia: Secondary | ICD-10-CM | POA: Diagnosis not present

## 2017-10-04 DIAGNOSIS — E8779 Other fluid overload: Secondary | ICD-10-CM | POA: Diagnosis not present

## 2017-10-04 DIAGNOSIS — D631 Anemia in chronic kidney disease: Secondary | ICD-10-CM | POA: Diagnosis not present

## 2017-10-04 DIAGNOSIS — N186 End stage renal disease: Secondary | ICD-10-CM | POA: Diagnosis not present

## 2017-10-05 DIAGNOSIS — A4152 Sepsis due to Pseudomonas: Secondary | ICD-10-CM | POA: Diagnosis not present

## 2017-10-05 DIAGNOSIS — B9562 Methicillin resistant Staphylococcus aureus infection as the cause of diseases classified elsewhere: Secondary | ICD-10-CM | POA: Diagnosis present

## 2017-10-05 DIAGNOSIS — A312 Disseminated mycobacterium avium-intracellulare complex (DMAC): Secondary | ICD-10-CM | POA: Diagnosis not present

## 2017-10-05 DIAGNOSIS — R000 Tachycardia, unspecified: Secondary | ICD-10-CM | POA: Diagnosis not present

## 2017-10-05 DIAGNOSIS — Z79899 Other long term (current) drug therapy: Secondary | ICD-10-CM | POA: Diagnosis not present

## 2017-10-05 DIAGNOSIS — A419 Sepsis, unspecified organism: Secondary | ICD-10-CM | POA: Diagnosis not present

## 2017-10-05 DIAGNOSIS — D696 Thrombocytopenia, unspecified: Secondary | ICD-10-CM | POA: Diagnosis not present

## 2017-10-05 DIAGNOSIS — R918 Other nonspecific abnormal finding of lung field: Secondary | ICD-10-CM | POA: Diagnosis not present

## 2017-10-05 DIAGNOSIS — Z940 Kidney transplant status: Secondary | ICD-10-CM | POA: Diagnosis not present

## 2017-10-05 DIAGNOSIS — M069 Rheumatoid arthritis, unspecified: Secondary | ICD-10-CM | POA: Diagnosis not present

## 2017-10-05 DIAGNOSIS — Z8719 Personal history of other diseases of the digestive system: Secondary | ICD-10-CM | POA: Diagnosis not present

## 2017-10-05 DIAGNOSIS — N186 End stage renal disease: Secondary | ICD-10-CM | POA: Diagnosis not present

## 2017-10-05 DIAGNOSIS — A4150 Gram-negative sepsis, unspecified: Secondary | ICD-10-CM | POA: Diagnosis present

## 2017-10-05 DIAGNOSIS — M79604 Pain in right leg: Secondary | ICD-10-CM | POA: Diagnosis not present

## 2017-10-05 DIAGNOSIS — R0602 Shortness of breath: Secondary | ICD-10-CM | POA: Diagnosis not present

## 2017-10-05 DIAGNOSIS — M7989 Other specified soft tissue disorders: Secondary | ICD-10-CM | POA: Diagnosis not present

## 2017-10-05 DIAGNOSIS — R509 Fever, unspecified: Secondary | ICD-10-CM | POA: Diagnosis not present

## 2017-10-05 DIAGNOSIS — R0989 Other specified symptoms and signs involving the circulatory and respiratory systems: Secondary | ICD-10-CM | POA: Diagnosis not present

## 2017-10-05 DIAGNOSIS — R531 Weakness: Secondary | ICD-10-CM | POA: Diagnosis not present

## 2017-10-05 DIAGNOSIS — G40909 Epilepsy, unspecified, not intractable, without status epilepticus: Secondary | ICD-10-CM | POA: Diagnosis not present

## 2017-10-05 DIAGNOSIS — I517 Cardiomegaly: Secondary | ICD-10-CM | POA: Diagnosis not present

## 2017-10-05 DIAGNOSIS — D631 Anemia in chronic kidney disease: Secondary | ICD-10-CM | POA: Diagnosis not present

## 2017-10-05 DIAGNOSIS — Z872 Personal history of diseases of the skin and subcutaneous tissue: Secondary | ICD-10-CM | POA: Diagnosis not present

## 2017-10-05 DIAGNOSIS — T8612 Kidney transplant failure: Secondary | ICD-10-CM | POA: Diagnosis not present

## 2017-10-05 DIAGNOSIS — H6690 Otitis media, unspecified, unspecified ear: Secondary | ICD-10-CM | POA: Diagnosis not present

## 2017-10-05 DIAGNOSIS — L539 Erythematous condition, unspecified: Secondary | ICD-10-CM | POA: Diagnosis not present

## 2017-10-05 DIAGNOSIS — R7881 Bacteremia: Secondary | ICD-10-CM | POA: Diagnosis not present

## 2017-10-05 DIAGNOSIS — Z992 Dependence on renal dialysis: Secondary | ICD-10-CM | POA: Diagnosis not present

## 2017-10-05 DIAGNOSIS — Z8619 Personal history of other infectious and parasitic diseases: Secondary | ICD-10-CM | POA: Diagnosis not present

## 2017-10-05 DIAGNOSIS — Z7952 Long term (current) use of systemic steroids: Secondary | ICD-10-CM | POA: Diagnosis not present

## 2017-10-05 DIAGNOSIS — R590 Localized enlarged lymph nodes: Secondary | ICD-10-CM | POA: Diagnosis not present

## 2017-10-05 DIAGNOSIS — E872 Acidosis: Secondary | ICD-10-CM | POA: Diagnosis not present

## 2017-10-05 DIAGNOSIS — R404 Transient alteration of awareness: Secondary | ICD-10-CM | POA: Diagnosis not present

## 2017-10-05 DIAGNOSIS — E8770 Fluid overload, unspecified: Secondary | ICD-10-CM | POA: Diagnosis not present

## 2017-10-05 DIAGNOSIS — R748 Abnormal levels of other serum enzymes: Secondary | ICD-10-CM | POA: Diagnosis present

## 2017-10-05 DIAGNOSIS — L03115 Cellulitis of right lower limb: Secondary | ICD-10-CM | POA: Diagnosis not present

## 2017-10-05 DIAGNOSIS — Z8659 Personal history of other mental and behavioral disorders: Secondary | ICD-10-CM | POA: Diagnosis not present

## 2017-10-05 DIAGNOSIS — I120 Hypertensive chronic kidney disease with stage 5 chronic kidney disease or end stage renal disease: Secondary | ICD-10-CM | POA: Diagnosis not present

## 2017-10-05 DIAGNOSIS — Z86718 Personal history of other venous thrombosis and embolism: Secondary | ICD-10-CM | POA: Diagnosis not present

## 2017-10-05 DIAGNOSIS — S31109A Unspecified open wound of abdominal wall, unspecified quadrant without penetration into peritoneal cavity, initial encounter: Secondary | ICD-10-CM | POA: Diagnosis not present

## 2017-10-05 DIAGNOSIS — R7989 Other specified abnormal findings of blood chemistry: Secondary | ICD-10-CM | POA: Diagnosis not present

## 2017-10-05 DIAGNOSIS — R6520 Severe sepsis without septic shock: Secondary | ICD-10-CM | POA: Diagnosis not present

## 2017-10-05 DIAGNOSIS — B965 Pseudomonas (aeruginosa) (mallei) (pseudomallei) as the cause of diseases classified elsewhere: Secondary | ICD-10-CM | POA: Diagnosis not present

## 2017-10-14 DIAGNOSIS — N186 End stage renal disease: Secondary | ICD-10-CM | POA: Diagnosis not present

## 2017-10-14 DIAGNOSIS — A4189 Other specified sepsis: Secondary | ICD-10-CM | POA: Diagnosis not present

## 2017-10-16 DIAGNOSIS — A312 Disseminated mycobacterium avium-intracellulare complex (DMAC): Secondary | ICD-10-CM | POA: Diagnosis not present

## 2017-10-16 DIAGNOSIS — T8130XA Disruption of wound, unspecified, initial encounter: Secondary | ICD-10-CM | POA: Diagnosis not present

## 2017-10-17 DIAGNOSIS — R7881 Bacteremia: Secondary | ICD-10-CM | POA: Diagnosis not present

## 2017-10-17 DIAGNOSIS — R05 Cough: Secondary | ICD-10-CM | POA: Diagnosis not present

## 2017-10-17 DIAGNOSIS — E8779 Other fluid overload: Secondary | ICD-10-CM | POA: Diagnosis not present

## 2017-10-17 DIAGNOSIS — N186 End stage renal disease: Secondary | ICD-10-CM | POA: Diagnosis not present

## 2017-10-17 DIAGNOSIS — R0989 Other specified symptoms and signs involving the circulatory and respiratory systems: Secondary | ICD-10-CM | POA: Diagnosis not present

## 2017-10-17 DIAGNOSIS — R1312 Dysphagia, oropharyngeal phase: Secondary | ICD-10-CM | POA: Diagnosis not present

## 2017-10-17 DIAGNOSIS — R1310 Dysphagia, unspecified: Secondary | ICD-10-CM | POA: Diagnosis not present

## 2017-10-17 DIAGNOSIS — I10 Essential (primary) hypertension: Secondary | ICD-10-CM | POA: Diagnosis not present

## 2017-10-17 DIAGNOSIS — R1313 Dysphagia, pharyngeal phase: Secondary | ICD-10-CM | POA: Diagnosis not present

## 2017-10-17 DIAGNOSIS — F458 Other somatoform disorders: Secondary | ICD-10-CM | POA: Diagnosis not present

## 2017-10-17 DIAGNOSIS — L03115 Cellulitis of right lower limb: Secondary | ICD-10-CM | POA: Diagnosis not present

## 2017-10-17 DIAGNOSIS — D631 Anemia in chronic kidney disease: Secondary | ICD-10-CM | POA: Diagnosis not present

## 2017-10-19 DIAGNOSIS — L03115 Cellulitis of right lower limb: Secondary | ICD-10-CM | POA: Diagnosis not present

## 2017-10-19 DIAGNOSIS — R7881 Bacteremia: Secondary | ICD-10-CM | POA: Diagnosis not present

## 2017-10-19 DIAGNOSIS — E8779 Other fluid overload: Secondary | ICD-10-CM | POA: Diagnosis not present

## 2017-10-19 DIAGNOSIS — I10 Essential (primary) hypertension: Secondary | ICD-10-CM | POA: Diagnosis not present

## 2017-10-19 DIAGNOSIS — N186 End stage renal disease: Secondary | ICD-10-CM | POA: Diagnosis not present

## 2017-10-19 DIAGNOSIS — A312 Disseminated mycobacterium avium-intracellulare complex (DMAC): Secondary | ICD-10-CM | POA: Diagnosis not present

## 2017-10-19 DIAGNOSIS — T8130XA Disruption of wound, unspecified, initial encounter: Secondary | ICD-10-CM | POA: Diagnosis not present

## 2017-10-19 DIAGNOSIS — D631 Anemia in chronic kidney disease: Secondary | ICD-10-CM | POA: Diagnosis not present

## 2017-10-20 DIAGNOSIS — N186 End stage renal disease: Secondary | ICD-10-CM | POA: Diagnosis not present

## 2017-10-20 DIAGNOSIS — Z992 Dependence on renal dialysis: Secondary | ICD-10-CM | POA: Diagnosis not present

## 2017-10-21 DIAGNOSIS — N186 End stage renal disease: Secondary | ICD-10-CM | POA: Diagnosis not present

## 2017-10-21 DIAGNOSIS — A312 Disseminated mycobacterium avium-intracellulare complex (DMAC): Secondary | ICD-10-CM | POA: Diagnosis not present

## 2017-10-21 DIAGNOSIS — D509 Iron deficiency anemia, unspecified: Secondary | ICD-10-CM | POA: Diagnosis not present

## 2017-10-21 DIAGNOSIS — T8130XA Disruption of wound, unspecified, initial encounter: Secondary | ICD-10-CM | POA: Diagnosis not present

## 2017-10-21 DIAGNOSIS — D631 Anemia in chronic kidney disease: Secondary | ICD-10-CM | POA: Diagnosis not present

## 2017-10-24 DIAGNOSIS — T8130XA Disruption of wound, unspecified, initial encounter: Secondary | ICD-10-CM | POA: Diagnosis not present

## 2017-10-24 DIAGNOSIS — D509 Iron deficiency anemia, unspecified: Secondary | ICD-10-CM | POA: Diagnosis not present

## 2017-10-24 DIAGNOSIS — N186 End stage renal disease: Secondary | ICD-10-CM | POA: Diagnosis not present

## 2017-10-24 DIAGNOSIS — A312 Disseminated mycobacterium avium-intracellulare complex (DMAC): Secondary | ICD-10-CM | POA: Diagnosis not present

## 2017-10-24 DIAGNOSIS — Z9889 Other specified postprocedural states: Secondary | ICD-10-CM | POA: Diagnosis not present

## 2017-10-24 DIAGNOSIS — D631 Anemia in chronic kidney disease: Secondary | ICD-10-CM | POA: Diagnosis not present

## 2017-10-24 DIAGNOSIS — K668 Other specified disorders of peritoneum: Secondary | ICD-10-CM | POA: Diagnosis not present

## 2017-10-25 DIAGNOSIS — Z09 Encounter for follow-up examination after completed treatment for conditions other than malignant neoplasm: Secondary | ICD-10-CM | POA: Diagnosis not present

## 2017-10-25 DIAGNOSIS — G5132 Clonic hemifacial spasm, left: Secondary | ICD-10-CM | POA: Diagnosis not present

## 2017-10-26 DIAGNOSIS — N186 End stage renal disease: Secondary | ICD-10-CM | POA: Diagnosis not present

## 2017-10-26 DIAGNOSIS — T8130XA Disruption of wound, unspecified, initial encounter: Secondary | ICD-10-CM | POA: Diagnosis not present

## 2017-10-26 DIAGNOSIS — D509 Iron deficiency anemia, unspecified: Secondary | ICD-10-CM | POA: Diagnosis not present

## 2017-10-26 DIAGNOSIS — D631 Anemia in chronic kidney disease: Secondary | ICD-10-CM | POA: Diagnosis not present

## 2017-10-26 DIAGNOSIS — A312 Disseminated mycobacterium avium-intracellulare complex (DMAC): Secondary | ICD-10-CM | POA: Diagnosis not present

## 2017-10-28 DIAGNOSIS — D509 Iron deficiency anemia, unspecified: Secondary | ICD-10-CM | POA: Diagnosis not present

## 2017-10-28 DIAGNOSIS — N186 End stage renal disease: Secondary | ICD-10-CM | POA: Diagnosis not present

## 2017-10-28 DIAGNOSIS — D631 Anemia in chronic kidney disease: Secondary | ICD-10-CM | POA: Diagnosis not present

## 2017-10-30 DIAGNOSIS — T8130XA Disruption of wound, unspecified, initial encounter: Secondary | ICD-10-CM | POA: Diagnosis not present

## 2017-10-30 DIAGNOSIS — A312 Disseminated mycobacterium avium-intracellulare complex (DMAC): Secondary | ICD-10-CM | POA: Diagnosis not present

## 2017-11-01 DIAGNOSIS — D631 Anemia in chronic kidney disease: Secondary | ICD-10-CM | POA: Diagnosis not present

## 2017-11-01 DIAGNOSIS — N186 End stage renal disease: Secondary | ICD-10-CM | POA: Diagnosis not present

## 2017-11-01 DIAGNOSIS — D509 Iron deficiency anemia, unspecified: Secondary | ICD-10-CM | POA: Diagnosis not present

## 2017-11-02 DIAGNOSIS — D631 Anemia in chronic kidney disease: Secondary | ICD-10-CM | POA: Diagnosis not present

## 2017-11-02 DIAGNOSIS — D509 Iron deficiency anemia, unspecified: Secondary | ICD-10-CM | POA: Diagnosis not present

## 2017-11-02 DIAGNOSIS — T8130XA Disruption of wound, unspecified, initial encounter: Secondary | ICD-10-CM | POA: Diagnosis not present

## 2017-11-02 DIAGNOSIS — A312 Disseminated mycobacterium avium-intracellulare complex (DMAC): Secondary | ICD-10-CM | POA: Diagnosis not present

## 2017-11-02 DIAGNOSIS — N186 End stage renal disease: Secondary | ICD-10-CM | POA: Diagnosis not present

## 2017-11-03 ENCOUNTER — Other Ambulatory Visit: Payer: Self-pay | Admitting: Emergency Medicine

## 2017-11-03 DIAGNOSIS — Z139 Encounter for screening, unspecified: Secondary | ICD-10-CM

## 2017-11-04 DIAGNOSIS — D509 Iron deficiency anemia, unspecified: Secondary | ICD-10-CM | POA: Diagnosis not present

## 2017-11-04 DIAGNOSIS — D631 Anemia in chronic kidney disease: Secondary | ICD-10-CM | POA: Diagnosis not present

## 2017-11-04 DIAGNOSIS — N186 End stage renal disease: Secondary | ICD-10-CM | POA: Diagnosis not present

## 2017-11-06 DIAGNOSIS — A312 Disseminated mycobacterium avium-intracellulare complex (DMAC): Secondary | ICD-10-CM | POA: Diagnosis not present

## 2017-11-06 DIAGNOSIS — T8130XA Disruption of wound, unspecified, initial encounter: Secondary | ICD-10-CM | POA: Diagnosis not present

## 2017-11-07 DIAGNOSIS — D631 Anemia in chronic kidney disease: Secondary | ICD-10-CM | POA: Diagnosis not present

## 2017-11-07 DIAGNOSIS — D509 Iron deficiency anemia, unspecified: Secondary | ICD-10-CM | POA: Diagnosis not present

## 2017-11-07 DIAGNOSIS — N186 End stage renal disease: Secondary | ICD-10-CM | POA: Diagnosis not present

## 2017-11-09 DIAGNOSIS — N186 End stage renal disease: Secondary | ICD-10-CM | POA: Diagnosis not present

## 2017-11-09 DIAGNOSIS — D631 Anemia in chronic kidney disease: Secondary | ICD-10-CM | POA: Diagnosis not present

## 2017-11-09 DIAGNOSIS — A312 Disseminated mycobacterium avium-intracellulare complex (DMAC): Secondary | ICD-10-CM | POA: Diagnosis not present

## 2017-11-09 DIAGNOSIS — T8130XA Disruption of wound, unspecified, initial encounter: Secondary | ICD-10-CM | POA: Diagnosis not present

## 2017-11-09 DIAGNOSIS — D509 Iron deficiency anemia, unspecified: Secondary | ICD-10-CM | POA: Diagnosis not present

## 2017-11-10 DIAGNOSIS — N186 End stage renal disease: Secondary | ICD-10-CM | POA: Diagnosis not present

## 2017-11-10 DIAGNOSIS — D631 Anemia in chronic kidney disease: Secondary | ICD-10-CM | POA: Diagnosis not present

## 2017-11-10 DIAGNOSIS — D509 Iron deficiency anemia, unspecified: Secondary | ICD-10-CM | POA: Diagnosis not present

## 2017-11-13 DIAGNOSIS — D509 Iron deficiency anemia, unspecified: Secondary | ICD-10-CM | POA: Diagnosis not present

## 2017-11-13 DIAGNOSIS — D631 Anemia in chronic kidney disease: Secondary | ICD-10-CM | POA: Diagnosis not present

## 2017-11-13 DIAGNOSIS — N186 End stage renal disease: Secondary | ICD-10-CM | POA: Diagnosis not present

## 2017-11-16 DIAGNOSIS — D509 Iron deficiency anemia, unspecified: Secondary | ICD-10-CM | POA: Diagnosis not present

## 2017-11-16 DIAGNOSIS — N186 End stage renal disease: Secondary | ICD-10-CM | POA: Diagnosis not present

## 2017-11-16 DIAGNOSIS — D631 Anemia in chronic kidney disease: Secondary | ICD-10-CM | POA: Diagnosis not present

## 2017-11-18 DIAGNOSIS — D631 Anemia in chronic kidney disease: Secondary | ICD-10-CM | POA: Diagnosis not present

## 2017-11-18 DIAGNOSIS — D509 Iron deficiency anemia, unspecified: Secondary | ICD-10-CM | POA: Diagnosis not present

## 2017-11-18 DIAGNOSIS — N186 End stage renal disease: Secondary | ICD-10-CM | POA: Diagnosis not present

## 2017-11-20 DIAGNOSIS — D509 Iron deficiency anemia, unspecified: Secondary | ICD-10-CM | POA: Diagnosis not present

## 2017-11-20 DIAGNOSIS — Z992 Dependence on renal dialysis: Secondary | ICD-10-CM | POA: Diagnosis not present

## 2017-11-20 DIAGNOSIS — N186 End stage renal disease: Secondary | ICD-10-CM | POA: Diagnosis not present

## 2017-11-20 DIAGNOSIS — D631 Anemia in chronic kidney disease: Secondary | ICD-10-CM | POA: Diagnosis not present

## 2017-12-04 ENCOUNTER — Ambulatory Visit: Payer: Medicare Other

## 2017-12-04 ENCOUNTER — Other Ambulatory Visit: Payer: Self-pay | Admitting: *Deleted

## 2017-12-04 ENCOUNTER — Ambulatory Visit
Admission: RE | Admit: 2017-12-04 | Discharge: 2017-12-04 | Disposition: A | Discharge status: Home / Self Care | Payer: Medicare Other | Source: Ambulatory Visit | Attending: *Deleted | Admitting: *Deleted

## 2017-12-04 DIAGNOSIS — Z139 Encounter for screening, unspecified: Secondary | ICD-10-CM

## 2018-09-18 IMAGING — MG DIGITAL SCREENING BILATERAL MAMMOGRAM WITH CAD
4 series · 4 of 4 positions shown · non-contrast
Comparison: Previous exam(s).

CLINICAL DATA: Screening.

EXAM:
DIGITAL SCREENING BILATERAL MAMMOGRAM WITH CAD

[L MLO]
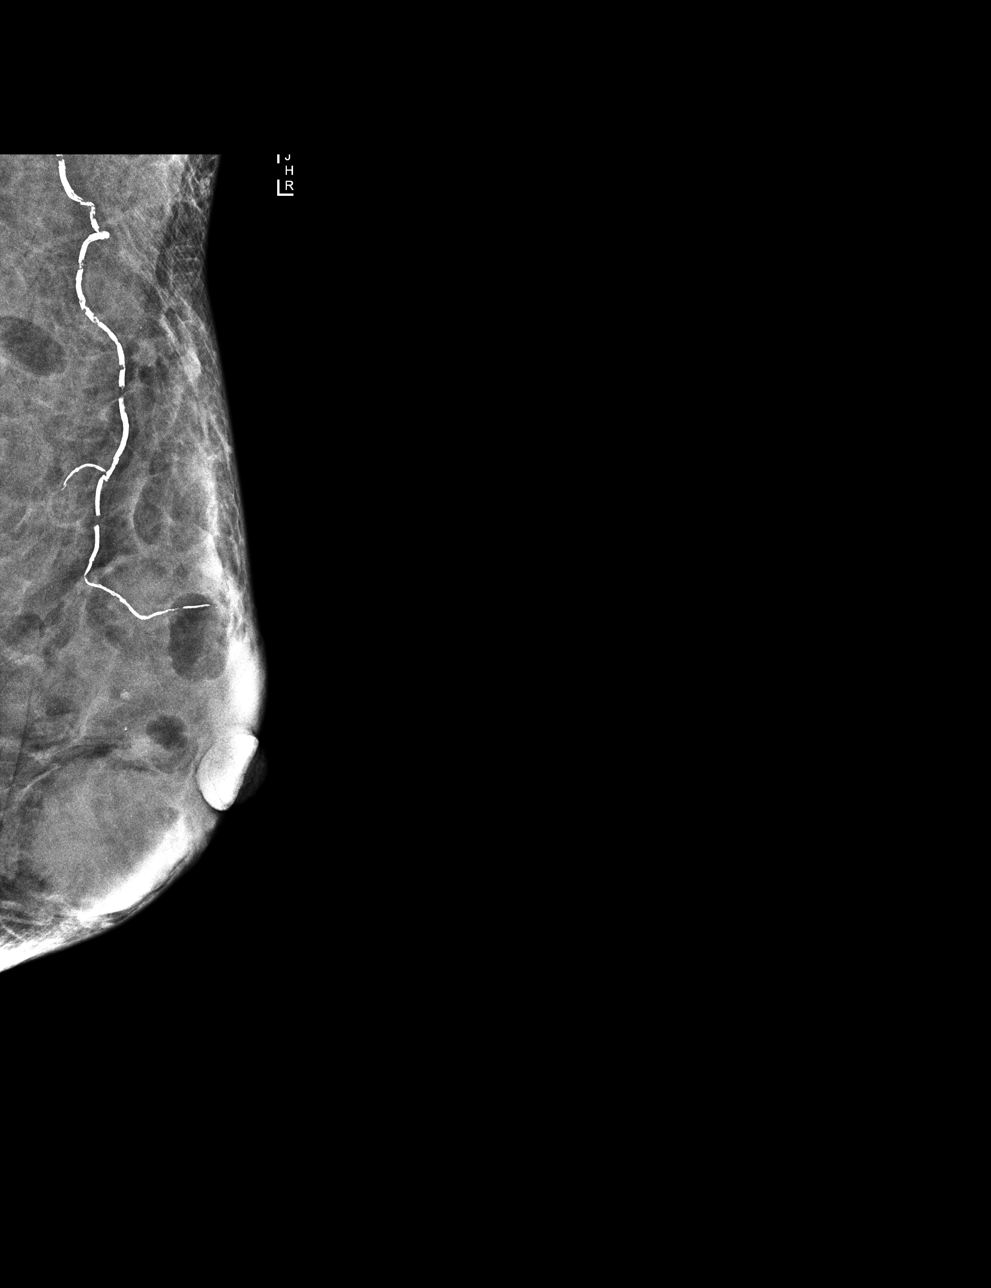

[R CC]
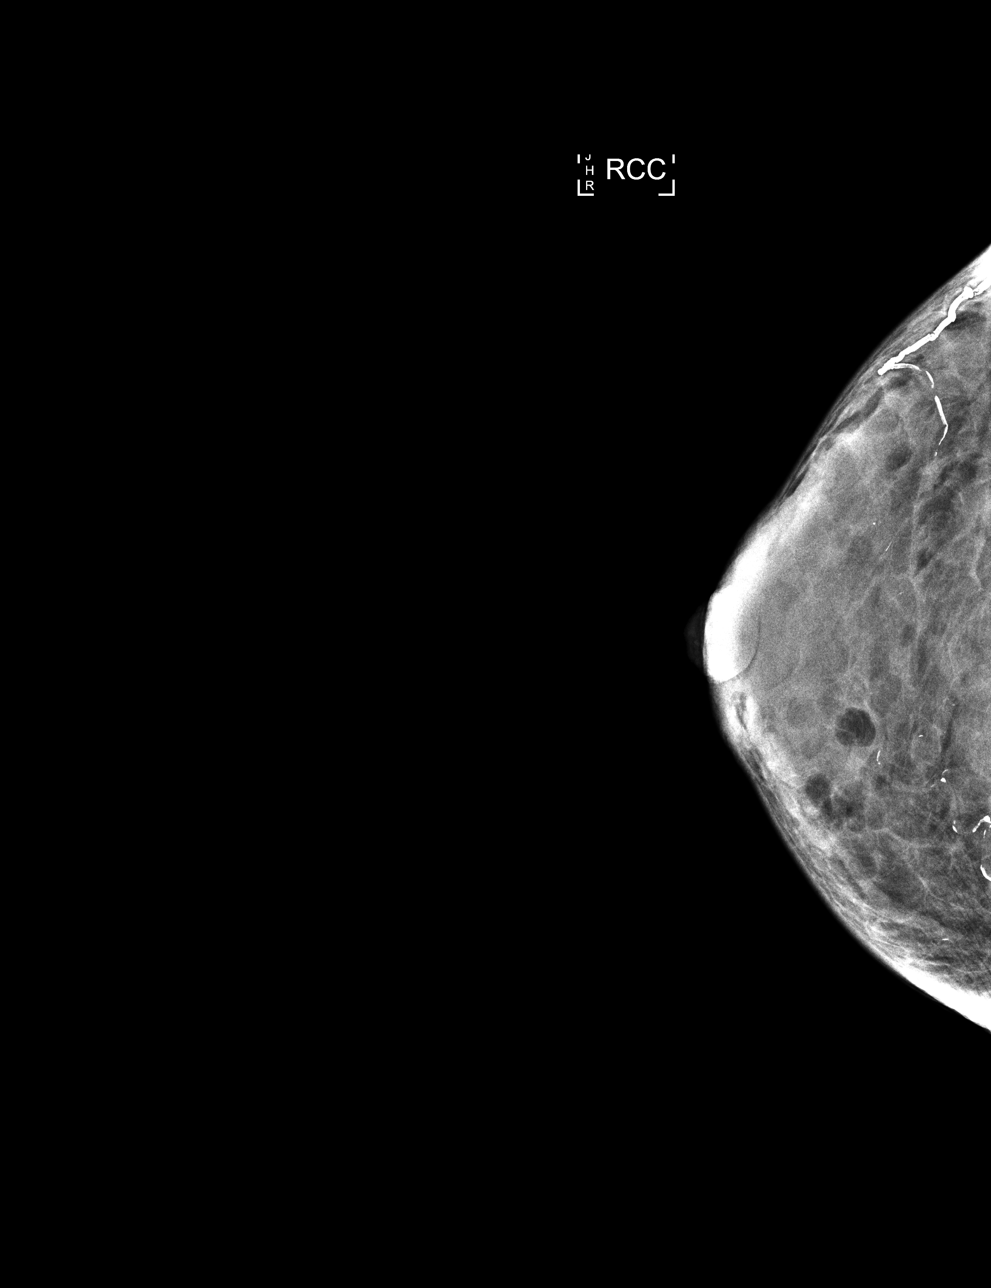

[L CC]
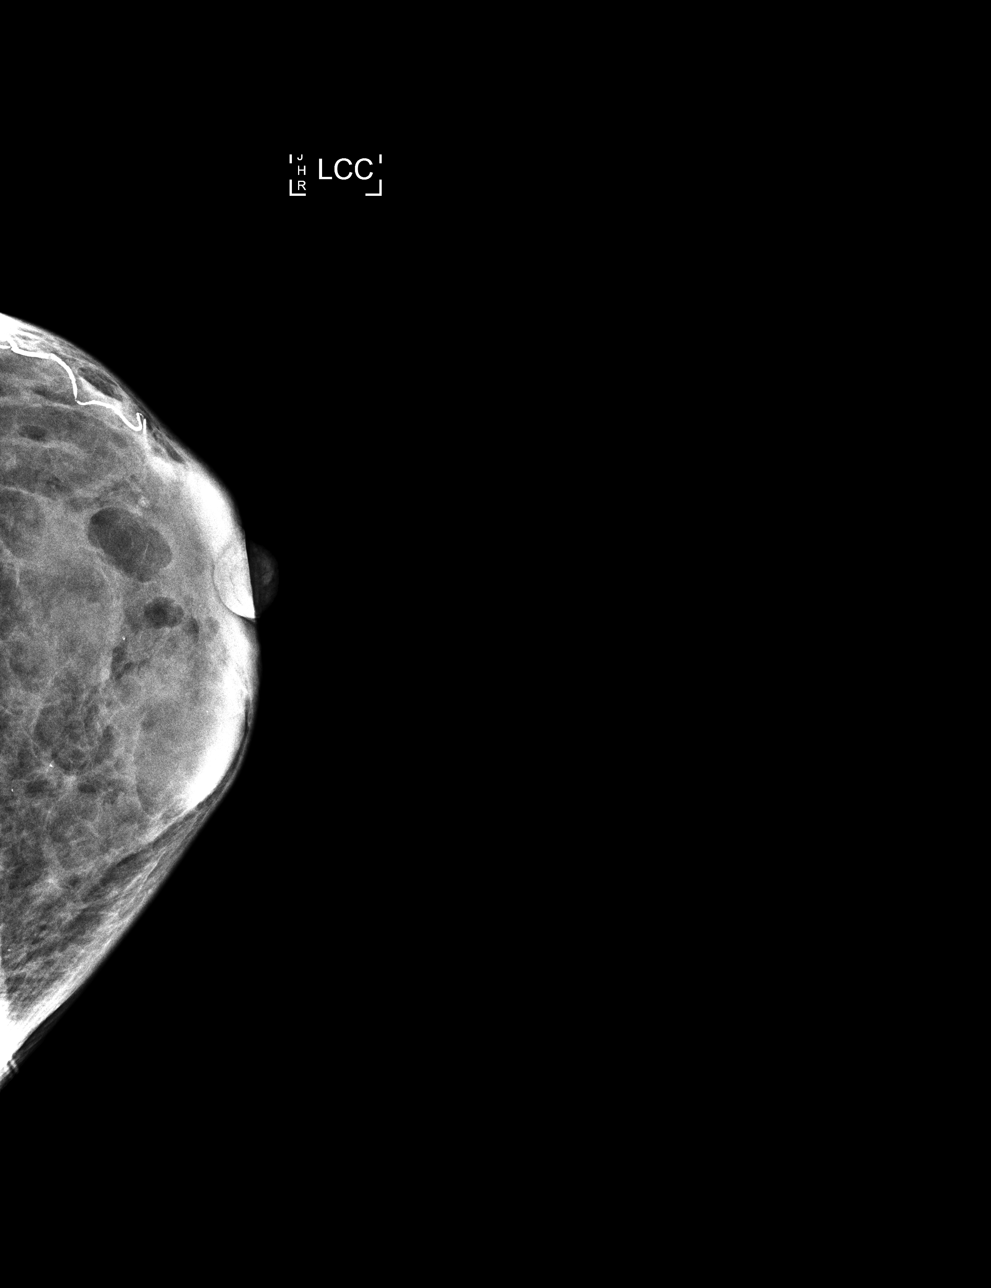

[R MLO]
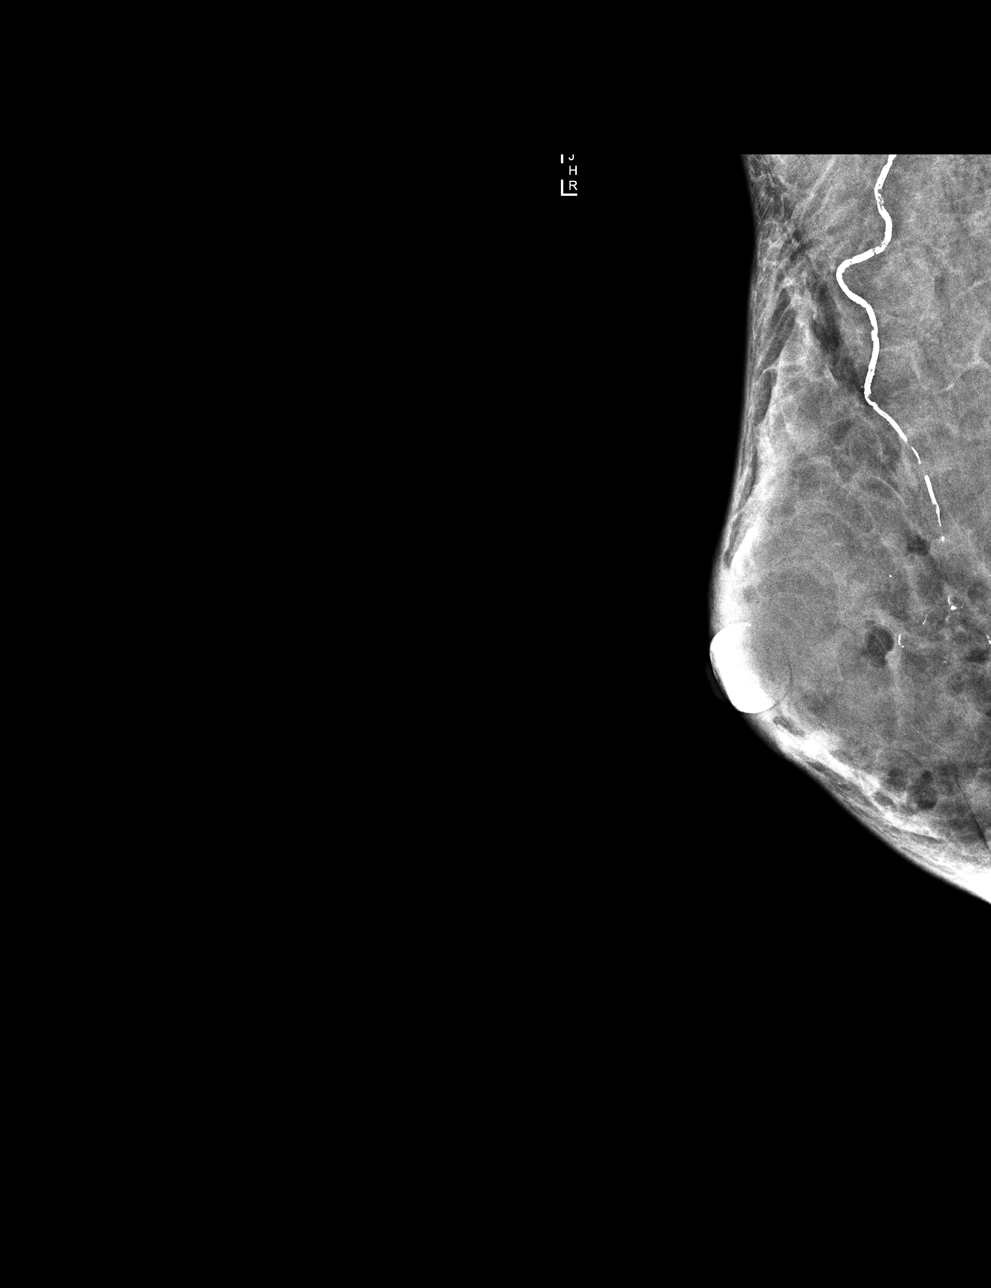

[4 of 4 positions shown; findings below may reference images not displayed]

ACR Breast Density Category d: The breast tissue is extremely dense,
which lowers the sensitivity of mammography.
FINDINGS: There are no findings suspicious for malignancy. Images were
processed with CAD.
IMPRESSION: No mammographic evidence of malignancy. A result letter of this
screening mammogram will be mailed directly to the patient.

RECOMMENDATION:
Screening mammogram in one year. (Code:[id number])

BI-RADS CATEGORY  1: Negative.

## 2018-11-01 ENCOUNTER — Other Ambulatory Visit: Payer: Self-pay | Admitting: *Deleted

## 2018-11-01 DIAGNOSIS — Z1231 Encounter for screening mammogram for malignant neoplasm of breast: Secondary | ICD-10-CM

## 2018-12-13 ENCOUNTER — Ambulatory Visit
Admission: RE | Admit: 2018-12-13 | Discharge: 2018-12-13 | Disposition: A | Discharge status: Home / Self Care | Payer: Medicare Other | Source: Ambulatory Visit | Attending: *Deleted | Admitting: *Deleted

## 2018-12-13 DIAGNOSIS — Z1231 Encounter for screening mammogram for malignant neoplasm of breast: Secondary | ICD-10-CM

## 2018-12-24 ENCOUNTER — Telehealth (HOSPITAL_COMMUNITY): Payer: Self-pay | Admitting: *Deleted

## 2018-12-24 NOTE — Telephone Encounter (Signed)
Received referral for Kathleen [name] to participate in pulmonary rehab by Dr. Randell Patient at [facility] for Restrictive Lung disease.  Ms. Kasinger stopped by the department last week inquiring information about our pulmonary rehab program.  Pt given verbal and written information.  Rehab staff obtained demographic information, brief medical history including the Name of her nephrologist so that clearance and parameters for this patient can be obtained.  Will forward to support staff to create referral and verify insurance eligibility/benefits.  Cherre Huger, BSN Cardiac and Training and development officer

## 2019-01-04 ENCOUNTER — Telehealth (HOSPITAL_COMMUNITY): Payer: Self-pay

## 2019-01-04 NOTE — Telephone Encounter (Signed)
Attempted to call patient in regards to Pulmonary Rehab - LM on VM °

## 2019-01-04 NOTE — Telephone Encounter (Signed)
Pt insurance is active and benefits verified through Medicare A/B. Co-pay $0.00, DED $0.00/$0.00 met, out of pocket $0.00/$0.00 met, co-insurance 0%. No pre-authorization required. Passport, 01/04/2019 @ 9:39AM, REF# [id number]  Pt's 2ndary insurance is active through [place]. Ref# [id number]  Will fax over [facility] Reimbursement form to Dr. Morley Kos Caffie Pinto

## 2019-01-09 NOTE — Telephone Encounter (Signed)
Disregard insurance benefits listed below, because they are for Cardiac Rehab. Update and correct insurance benefits listed below.  Pt insurance is active and benefits verified through Medicare A/B. Co-pay $0.00, DED $198.00/$0.00 met, out of pocket $0.00/$0.00 met, co-insurance 20%. No pre-authorization required. 01/09/2019 @ 9:07AM

## 2019-01-11 ENCOUNTER — Telehealth (HOSPITAL_COMMUNITY): Payer: Self-pay

## 2019-01-11 NOTE — Telephone Encounter (Signed)
Pt will come in for orientation 01/16/2019 @ 9AM.

## 2019-01-11 NOTE — Telephone Encounter (Signed)
Referral received from [facility] for Pulmonary Rehab with diagnosis of restrictive lung disease and chronic cough. Clinical review of pt EMR. Order received from Dr. Randell Patient, pt PCP and letter of clearance received from Dr. Jolinda Croak, pt nephrologist to attend pulmonary rehab on tuesdays after dialysis. Pt appropriate for scheduling for Pulmonary rehab. Will forward to support staff for scheduling and verification of insurance eligibility/benefits with pt consent.   Joycelyn Man, RN, BSN Cardiac and Pulmonary Rehab Nurse

## 2019-01-16 ENCOUNTER — Encounter (HOSPITAL_COMMUNITY)
Admission: RE | Admit: 2019-01-16 | Discharge: 2019-01-16 | Disposition: A | Discharge status: Home / Self Care | Payer: Medicare Other | Source: Ambulatory Visit | Attending: Pulmonary Disease | Admitting: Pulmonary Disease

## 2019-01-16 ENCOUNTER — Encounter (HOSPITAL_COMMUNITY): Payer: Self-pay

## 2019-01-16 VITALS — BP 132/88 | HR 101 | Temp 98.1°F | Resp 18 | Ht 62.5 in | Wt 143.5 lb

## 2019-01-16 DIAGNOSIS — J984 Other disorders of lung: Secondary | ICD-10-CM | POA: Insufficient documentation

## 2019-01-16 NOTE — Progress Notes (Signed)
Kathleen [name] 53 y.o. female Pulmonary Rehab Orientation Note [place] who is referred to pulmonary rehab by Dr. Randell Patient @ [facility] for Restrictive Lung Disease. She  arrived today in Cardiac and Pulmonary Rehab for orientation to Pulmonary Rehab. She ambulated from the main entrance of the hospital and utilized [employer]. She does not carry portable oxygen. Per pt, she uses oxygen never. Color good, skin warm and dry. Patient is oriented to time and place. Patient's medical history, psychosocial health, and medications reviewed. Psychosocial assessment reveals pt lives alone. Pt is currently retired from working in Designer, jewellery at [employer]. Pt hobbies include reading, coloring in adult coloring books and cooking. Pt reports her stress level is none.  Although pt has a chronic illness ESRD and is on dialysis.  She manages this very well. Pt does not exhibit  signs of depression. Pt continues to morn for her son who committed suicide for unknown reasons. She has moments of sadness but is able to quickly turn it around. Pt faith in God has sustained her.  PHQ2/9 score 0/3. Pt shows good  coping skills with positive outlook . Will continue to monitor and evaluate progress toward psychosocial goal(s) of continued well being and increased physical activity for surgery on her abdomen. Physical assessment reveals heart rate is tachycardic, breath sounds diminished, moderate rhonchi heard diffusely throughout both lungs. Audible congestion when she is taking deep breaths. Grip strength equal, strong. Distal pulses palpabe. Patient reports she does take medications as prescribed. Patient states she follows a Renal diet. The patient reports no specific efforts to gain or lose weight.. Patient's weight will be monitored closely. Demonstration and practice of PLB using pulse oximeter. Patient able to return demonstration satisfactorily. Safety and hand hygiene in the exercise area reviewed with  patient. Patient voices understanding of the information reviewed. Department expectations discussed with patient and achievable goals were set. The patient shows enthusiasm about attending the program and we look forward to working with this nice lady. The patient is scheduled for a 6 min walk test on 01/17/2019 and to begin exercise on 01/24/2019.  45 minutes was spent on a variety of activities such as assessment of the patient, obtaining baseline data including height, weight, BMI, and grip strength, verifying medical history, allergies, and current medications, and teaching patient strategies for performing tasks with less respiratory effort with emphasis on pursed lip breathing. [phone] [place], BSN Cardiac and Pulmonary Rehab Nurse Navigator

## 2019-01-17 ENCOUNTER — Encounter (HOSPITAL_COMMUNITY)
Admission: RE | Admit: 2019-01-17 | Discharge: 2019-01-17 | Disposition: A | Discharge status: Home / Self Care | Payer: Medicare Other | Source: Ambulatory Visit | Attending: Pulmonary Disease | Admitting: Pulmonary Disease

## 2019-01-17 DIAGNOSIS — J984 Other disorders of lung: Secondary | ICD-10-CM | POA: Diagnosis not present

## 2019-01-18 NOTE — Progress Notes (Signed)
Pulmonary Individual Treatment Plan  Patient Details  Name: Kathleen [name] MRN: [id number] Date of Birth: July 23, 1966 Referring Provider:     Pulmonary Rehab Walk Test from 01/17/2019 in [place]  Referring Provider  Dr. Tanna Savoy      Initial Encounter Date:    Pulmonary Rehab Walk Test from 01/17/2019 in [place]  Date  01/17/19      Visit Diagnosis: Restrictive lung disease  Patient's Home Medications on Admission:   Current Outpatient Medications:  .  acetaminophen (TYLENOL) 500 MG tablet, Take 1,000 mg by mouth every 6 (six) hours as needed for moderate pain., Disp: , Rfl:  .  amitriptyline (ELAVIL) 25 MG tablet, Take 25 mg by mouth daily., Disp: , Rfl:  .  amoxicillin (AMOXIL) 500 MG capsule, Take 500 mg by mouth once as needed. Takes four capsules prior to dental procedures, Disp: , Rfl:  .  cetirizine (ZYRTEC) 10 MG tablet, Take 10 mg by mouth daily., Disp: , Rfl:  .  gabapentin (NEURONTIN) 100 MG capsule, Take 100 mg by mouth at bedtime. Take 3 capsules, Disp: , Rfl:  .  ipratropium (ATROVENT) 0.06 % nasal spray, Place 42 sprays into the nose 2 (two) times daily as needed., Disp: , Rfl:  .  labetalol (NORMODYNE) 100 MG tablet, Take 300 mg by mouth 2 (two) times daily. , Disp: , Rfl:  .  lactulose (CHRONULAC) 10 GM/15ML solution, Take 10 g by mouth 3 (three) times daily with meals., Disp: , Rfl:  .  Multiple Vitamin (MULTIVITAMIN WITH MINERALS) TABS, Take 1 tablet by mouth daily. Women vitamin, Disp: , Rfl:  .  Multiple Vitamins-Minerals (HAIR/SKIN/NAILS) TABS, Take 1 tablet by mouth daily., Disp: , Rfl:  .  ondansetron (ZOFRAN-ODT) 4 MG disintegrating tablet, Take 4 mg by mouth as needed., Disp: , Rfl:  .  predniSONE (DELTASONE) 5 MG tablet, Take 5 mg by mouth daily with breakfast., Disp: , Rfl:  .  RENVELA 0.8 g PACK packet, Take 0.8 g by mouth 3 (three) times daily with meals. Takes 4 packets with meals Takes 3  packets with snack, Disp: , Rfl:  .  vitamin B-12 (CYANOCOBALAMIN) 1000 MCG tablet, Take 1,000 mcg by mouth daily., Disp: , Rfl:   Past Medical History: Past Medical History:  Diagnosis Date  . Anemia    iron def,   . Chronic kidney disease   . Hypertension   . Rheumatoid arthritis of the hand     Tobacco Use: Social History   Tobacco Use  Smoking Status Never Smoker  Smokeless Tobacco Never Used    Labs: Recent Review Flowsheet Data    Labs for ITP Cardiac and Pulmonary Rehab Latest Ref Rng & Units 03/16/2009 05/11/2009 07/06/2009 08/03/2009 02/16/2011   Cholestrol 0 - 200 mg/dL 191 ATP III CLASSIFICATION: <200     mg/dL   Desirable 200-239  mg/dL   Borderline High >=240    mg/dL   High 182 ATP III CLASSIFICATION: <200     mg/dL   Desirable 200-239  mg/dL   Borderline High >=240    mg/dL   High 182 ATP III CLASSIFICATION: <200     mg/dL   Desirable 200-239  mg/dL   Borderline High >=240    mg/dL   High 174 ATP III CLASSIFICATION: <200     mg/dL   Desirable 200-239  mg/dL   Borderline High >=240    mg/dL   High -   LDLCALC 0 -  99 mg/dL 109 Total Cholesterol/HDL:CHD Risk Coronary Heart Disease Risk Table Men   Women 1/2 Average Risk   3.4   3.3 Average Risk       5.0   4.4 2 X Average Risk   9.6   7.1 3 X Average Risk  23.4   11.0 Use the calculated Patient Ratio above and the CHD Risk Table to determine the patient's CHD Risk. ATP III CLASSIFICATION (LDL): <100     mg/dL   Optimal 100-129  mg/dL   Near or Above Optimal 130-159  mg/dL   Borderline 160-189  mg/dL   High >190     mg/dL   Very High(H) 89 Total Cholesterol/HDL:CHD Risk Coronary Heart Disease Risk Table Men   Women 1/2 Average Risk   3.4   3.3 Average Risk       5.0   4.4 2 X Average Risk   9.6   7.1 3 X Average Risk  23.4   11.0 Use the calculated Patient Ratio above and the CHD Risk Table to determine the patient's CHD Risk. ATP III CLASSIFICATION (LDL): <100     mg/dL   Optimal 100-129   mg/dL   Near or Above Optimal 130-159  mg/dL   Borderline 160-189  mg/dL   High >190     mg/dL   Very High 104 Total Cholesterol/HDL:CHD Risk Coronary Heart Disease Risk Table Men   Women 1/2 Average Risk   3.4   3.3 Average Risk       5.0   4.4 2 X Average Risk   9.6   7.1 3 X Average Risk  23.4   11.0 Use the calculated Patient Ratio above and the CHD Risk Table to determine the patient's CHD Risk. ATP III CLASSIFICATION (LDL): <100     mg/dL   Optimal 100-129  mg/dL   Near or Above Optimal 130-159  mg/dL   Borderline 160-189  mg/dL   High >190     mg/dL   Very High(H) 93 Total Cholesterol/HDL:CHD Risk Coronary Heart Disease Risk Table Men   Women 1/2 Average Risk   3.4   3.3 Average Risk       5.0   4.4 2 X Average Risk   9.6   7.1 3 X Average Risk  23.4   11.0 Use the calculated Patient Ratio above and the CHD Risk Table to determine the patient's CHD Risk. ATP III CLASSIFICATION (LDL): <100     mg/dL   Optimal 100-129  mg/dL   Near or Above Optimal 130-159  mg/dL   Borderline 160-189  mg/dL   High >190     mg/dL   Very High -   HDL >39 mg/dL 57 48 57 57 -   Trlycerides <150 mg/dL 127 223(H) 106 122 -   Hemoglobin A1c 4.0 - 6.0 % 5.1 (NOTE) The ADA recommends the following therapeutic goal for glycemic control related to Hgb A1c measurement: Goal of therapy: <6.5 Hgb A1c  Reference: American Diabetes Association: Clinical Practice Recommendations 2010, Diabetes Care, 2010, 33: (Suppl 1). 5.3 (NOTE) The ADA recommends the following therapeutic goal for glycemic control related to Hgb A1c measurement: Goal of therapy: <6.5 Hgb A1c  Reference: American Diabetes Association: Clinical Practice Recommendations 2010, Diabetes Care, 2010, 33: (Suppl 1). - - 4.8      Capillary Blood Glucose: No results found for: GLUCAP   Pulmonary Assessment Scores: Pulmonary Assessment Scores    Row Name 01/16/19 1235 01/18/19 1030  ADL UCSD   ADL Phase  Entry  Entry     SOB Score total  19  -      CAT Score   CAT Score  34  -      mMRC Score   mMRC Score  -  1       Pulmonary Function Assessment:   Exercise Target Goals: Exercise Program Goal: Individual exercise prescription set using results from initial 6 min walk test and THRR while considering  patient's activity barriers and safety.   Exercise Prescription Goal: Initial exercise prescription builds to 30-45 minutes a day of aerobic activity, 2-3 days per week.  Home exercise guidelines will be given to patient during program as part of exercise prescription that the participant will acknowledge.  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 HR  135 bpm     Max Ex. BP  204/100     2 Minute Post BP  180/92 174/90       Interval HR   1 Minute HR  110     2 Minute HR  110     3 Minute HR  127     4 Minute HR  132     5 Minute HR  135     6 Minute HR  132     2 Minute Post HR  110     Interval Heart Rate?  Yes       Interval Oxygen   Interval Oxygen?  Yes     Baseline Oxygen Saturation %  94 %     1 Minute Oxygen Saturation %  89 %     1 Minute Liters of Oxygen  0 L     2 Minute Oxygen Saturation %  89 %     2 Minute Liters of Oxygen  0 L     3 Minute Oxygen Saturation %  90 %     3 Minute Liters of Oxygen  0 L     4 Minute Oxygen Saturation %  89  %     4 Minute Liters of Oxygen  0 L     5 Minute Oxygen Saturation %  89 %     5 Minute Liters of Oxygen  0 L     6 Minute Oxygen Saturation %  89 %     6 Minute Liters of Oxygen  0 L  2 Minute Post Oxygen Saturation %  91 %     2 Minute Post Liters of Oxygen  0 L        Oxygen Initial Assessment: Oxygen Initial Assessment - 01/18/19 1029      Initial 6 min Walk   Oxygen Used  None      Program Oxygen Prescription   Program Oxygen Prescription  None       Oxygen Re-Evaluation:   Oxygen Discharge (Final Oxygen Re-Evaluation):   Initial Exercise Prescription: Initial Exercise Prescription - 01/18/19 1000      Date of Initial Exercise RX and Referring Provider   Date  01/17/19    Referring Provider  Dr. Tanna Savoy      Recumbant Bike   Level  1    Watts  10    Minutes  17      NuStep   Level  2    SPM  80    Minutes  17    METs  1.5      Track   Laps  10    Minutes  17      Prescription Details   Frequency (times per week)  2    Duration  Progress to 45 minutes of aerobic exercise without signs/symptoms of physical distress      Intensity   THRR 40-80% of Max Heartrate  68-135    Ratings of Perceived Exertion  11-13    Perceived Dyspnea  0-4      Progression   Progression  Continue to progress workloads to maintain intensity without signs/symptoms of physical distress.      Resistance Training   Training Prescription  Yes    Weight  orange bands    Reps  10-15       Perform Capillary Blood Glucose checks as needed.  Exercise Prescription Changes:   Exercise Comments:   Exercise Goals and Review: Exercise Goals    Row Name 01/16/19 1027             Exercise Goals   Increase Physical Activity  Yes       Intervention  Provide advice, education, support and counseling about physical activity/exercise needs.;Develop an individualized exercise prescription for aerobic and resistive training based on initial evaluation findings, risk  stratification, comorbidities and participant's personal goals.       Expected Outcomes  Short Term: Attend rehab on a regular basis to increase amount of physical activity.;Long Term: Add in home exercise to make exercise part of routine and to increase amount of physical activity.;Long Term: Exercising regularly at least 3-5 days a week.       Increase Strength and Stamina  Yes       Intervention  Provide advice, education, support and counseling about physical activity/exercise needs.;Develop an individualized exercise prescription for aerobic and resistive training based on initial evaluation findings, risk stratification, comorbidities and participant's personal goals.       Expected Outcomes  Short Term: Increase workloads from initial exercise prescription for resistance, speed, and METs.;Short Term: Perform resistance training exercises routinely during rehab and add in resistance training at home;Long Term: Improve cardiorespiratory fitness, muscular endurance and strength as measured by increased METs and functional capacity (6MWT)       Able to understand and use rate of perceived exertion (RPE) scale  Yes       Intervention  Provide education and explanation on how to use RPE scale       Expected Outcomes  Short  Term: Able to use RPE daily in rehab to express subjective intensity level;Long Term:  Able to use RPE to guide intensity level when exercising independently       Able to understand and use Dyspnea scale  Yes       Intervention  Provide education and explanation on how to use Dyspnea scale       Expected Outcomes  Short Term: Able to use Dyspnea scale daily in rehab to express subjective sense of shortness of breath during exertion;Long Term: Able to use Dyspnea scale to guide intensity level when exercising independently       Knowledge and understanding of Target Heart Rate Range (THRR)  Yes       Intervention  Provide education and explanation of THRR including how the numbers  were predicted and where they are located for reference       Expected Outcomes  Short Term: Able to state/look up THRR;Long Term: Able to use THRR to govern intensity when exercising independently;Short Term: Able to use daily as guideline for intensity in rehab       Understanding of Exercise Prescription  Yes       Intervention  Provide education, explanation, and written materials on patient's individual exercise prescription       Expected Outcomes  Short Term: Able to explain program exercise prescription;Long Term: Able to explain home exercise prescription to exercise independently          Exercise Goals Re-Evaluation :   Discharge Exercise Prescription (Final Exercise Prescription Changes):   Nutrition:  Target Goals: Understanding of nutrition guidelines, daily intake of sodium 1500mg , cholesterol 200mg , calories 30% from fat and 7% or less from saturated fats, daily to have 5 or more servings of fruits and vegetables.  Biometrics:    Nutrition Therapy Plan and Nutrition Goals:   Nutrition Assessments:   Nutrition Goals Re-Evaluation:   Nutrition Goals Discharge (Final Nutrition Goals Re-Evaluation):   Psychosocial: Target Goals: Acknowledge presence or absence of significant depression and/or stress, maximize coping skills, provide positive support system. Participant is able to verbalize types and ability to use techniques and skills needed for reducing stress and depression.  Initial Review & Psychosocial Screening: Initial Psych Review & Screening - 01/16/19 1119      Initial Review   Current issues with  None Identified      Family Dynamics   Good Support System?  Yes    Comments  son       Barriers   Psychosocial barriers to participate in program  The patient should benefit from training in stress management and relaxation.;There are no identifiable barriers or psychosocial needs.      Screening Interventions   Expected Outcomes  Long Term goal:  The participant improves quality of Life and PHQ9 Scores as seen by post scores and/or verbalization of changes;Short Term goal: Identification and review with participant of any Quality of Life or Depression concerns found by scoring the questionnaire.       Quality of Life Scores:  Scores of 19 and below usually indicate a poorer quality of life in these areas.  A difference of  2-3 points is a clinically meaningful difference.  A difference of 2-3 points in the total score of the Quality of Life Index has been associated with significant improvement in overall quality of life, self-image, physical symptoms, and general health in studies assessing change in quality of life.  PHQ-9: Recent Review Flowsheet Data    Depression screen PHQ  2/9 01/16/2019   Decreased Interest 0   Down, Depressed, Hopeless 0   PHQ - 2 Score 0   Altered sleeping 1   Tired, decreased energy 1   Change in appetite 0   Feeling bad or failure about yourself  0   Trouble concentrating 1   Moving slowly or fidgety/restless 0   Suicidal thoughts 0   PHQ-9 Score 3   Difficult doing work/chores Not difficult at all     Interpretation of Total Score  Total Score Depression Severity:  1-4 = Minimal depression, 5-9 = Mild depression, 10-14 = Moderate depression, 15-19 = Moderately severe depression, 20-27 = Severe depression   Psychosocial Evaluation and Intervention:   Psychosocial Re-Evaluation:   Psychosocial Discharge (Final Psychosocial Re-Evaluation):   Education: Education Goals: Education classes will be provided on a weekly basis, covering required topics. Participant will state understanding/return demonstration of topics presented.  Learning Barriers/Preferences: Learning Barriers/Preferences - 01/16/19 1122      Learning Barriers/Preferences   Learning Barriers  Sight    Learning Preferences  Individual Instruction;Group Instruction;Audio;Skilled Demonstration;Verbal Instruction;Written  Material       Education Topics: Risk Factor Reduction:  -Group instruction that is supported by a PowerPoint presentation. Instructor discusses the definition of a risk factor, different risk factors for pulmonary disease, and how the heart and lungs work together.     Nutrition for Pulmonary Patient:  -Group instruction provided by PowerPoint slides, verbal discussion, and written materials to support subject matter. The instructor gives an explanation and review of healthy diet recommendations, which includes a discussion on weight management, recommendations for fruit and vegetable consumption, as well as protein, fluid, caffeine, fiber, sodium, sugar, and alcohol. Tips for eating when patients are short of breath are discussed.   Pursed Lip Breathing:  -Group instruction that is supported by demonstration and informational handouts. Instructor discusses the benefits of pursed lip and diaphragmatic breathing and detailed demonstration on how to preform both.     Oxygen Safety:  -Group instruction provided by PowerPoint, verbal discussion, and written material to support subject matter. There is an overview of "What is Oxygen" and "Why do we need it".  Instructor also reviews how to create a safe environment for oxygen use, the importance of using oxygen as prescribed, and the risks of noncompliance. There is a brief discussion on traveling with oxygen and resources the patient may utilize.   Oxygen Equipment:  -Group instruction provided by [facility] Staff utilizing handouts, written materials, and equipment demonstrations.   Signs and Symptoms:  -Group instruction provided by written material and verbal discussion to support subject matter. Warning signs and symptoms of infection, stroke, and heart attack are reviewed and when to call the physician/911 reinforced. Tips for preventing the spread of infection discussed.   Advanced Directives:  -Group instruction provided by verbal  instruction and written material to support subject matter. Instructor reviews Advanced Directive laws and proper instruction for filling out document.   Pulmonary Video:  -Group video education that reviews the importance of medication and oxygen compliance, exercise, good nutrition, pulmonary hygiene, and pursed lip and diaphragmatic breathing for the pulmonary patient.   Exercise for the Pulmonary Patient:  -Group instruction that is supported by a PowerPoint presentation. Instructor discusses benefits of exercise, core components of exercise, frequency, duration, and intensity of an exercise routine, importance of utilizing pulse oximetry during exercise, safety while exercising, and options of places to exercise outside of rehab.     Pulmonary Medications:  -Verbally  interactive group education provided by instructor with focus on inhaled medications and proper administration.   Anatomy and Physiology of the Respiratory System and Intimacy:  -Group instruction provided by PowerPoint, verbal discussion, and written material to support subject matter. Instructor reviews respiratory cycle and anatomical components of the respiratory system and their functions. Instructor also reviews differences in obstructive and restrictive respiratory diseases with examples of each. Intimacy, Sex, and Sexuality differences are reviewed with a discussion on how relationships can change when diagnosed with pulmonary disease. Common sexual concerns are reviewed.   MD DAY -A group question and answer session with a medical doctor that allows participants to ask questions that relate to their pulmonary disease state.   OTHER EDUCATION -Group or individual verbal, written, or video instructions that support the educational goals of the pulmonary rehab program.   Holiday Eating Survival Tips:  -Group instruction provided by PowerPoint slides, verbal discussion, and written materials to support subject  matter. The instructor gives patients tips, tricks, and techniques to help them not only survive but enjoy the holidays despite the onslaught of food that accompanies the holidays.   Knowledge Questionnaire Score: Knowledge Questionnaire Score - 01/16/19 1241      Knowledge Questionnaire Score   Pre Score  17/18       Core Components/Risk Factors/Patient Goals at Admission: Personal Goals and Risk Factors at Admission - 01/16/19 1122      Core Components/Risk Factors/Patient Goals on Admission    Weight Management  Yes    Intervention  Weight Management/Obesity: Establish reasonable short term and long term weight goals.;Weight Management: Provide education and appropriate resources to help participant work on and attain dietary goals.;Weight Management: Develop a combined nutrition and exercise program designed to reach desired caloric intake, while maintaining appropriate intake of nutrient and fiber, sodium and fats, and appropriate energy expenditure required for the weight goal.    Admit Weight  143 lb 8.3 oz (65.1 kg)    Goal Weight: Short Term  135 lb (61.2 kg)    Goal Weight: Long Term  128 lb (58.1 kg)    Expected Outcomes  Understanding of distribution of calorie intake throughout the day with the consumption of 4-5 meals/snacks;Understanding recommendations for meals to include 15-35% energy as protein, 25-35% energy from fat, 35-60% energy from carbohydrates, less than 200mg  of dietary cholesterol, 20-35 gm of total fiber daily;Weight Loss: Understanding of general recommendations for a balanced deficit meal plan, which promotes 1-2 lb weight loss per week and includes a negative energy balance of [phone] kcal/d;Short Term: Continue to assess and modify interventions until short term weight is achieved;Long Term: Adherence to nutrition and physical activity/exercise program aimed toward attainment of established weight goal    Improve shortness of breath with ADL's  Yes     Intervention  Provide education, individualized exercise plan and daily activity instruction to help decrease symptoms of SOB with activities of daily living.    Expected Outcomes  Short Term: Improve cardiorespiratory fitness to achieve a reduction of symptoms when performing ADLs;Long Term: Be able to perform more ADLs without symptoms or delay the onset of symptoms    Hypertension  Yes    Intervention  Provide education on lifestyle modifcations including regular physical activity/exercise, weight management, moderate sodium restriction and increased consumption of fresh fruit, vegetables, and low fat dairy, alcohol moderation, and smoking cessation.;Monitor prescription use compliance.    Expected Outcomes  Short Term: Continued assessment and intervention until BP is < 140/65mm HG in hypertensive  participants. < 130/63mm HG in hypertensive participants with diabetes, heart failure or chronic kidney disease.;Long Term: Maintenance of blood pressure at goal levels.       Core Components/Risk Factors/Patient Goals Review:    Core Components/Risk Factors/Patient Goals at Discharge (Final Review):    ITP Comments: ITP Comments    Row Name 01/16/19 1015           ITP Comments  Dr. Manfred Arch, Medical Director for Pulmonary Rehab          Comments:

## 2019-01-18 NOTE — Progress Notes (Signed)
Kathleen [name] 53 y.o. female   DOB: 04/12/1966 MRN: [id number]          Nutrition 1. Restrictive lung disease    Past Medical History:  Diagnosis Date  . Anemia    iron def,   . Chronic kidney disease   . Hypertension   . Rheumatoid arthritis of the hand      Meds reviewed.  Current Outpatient Medications (Endocrine & Metabolic):  .  predniSONE (DELTASONE) 5 MG tablet, Take 5 mg by mouth daily with breakfast.  Current Outpatient Medications (Cardiovascular):  .  labetalol (NORMODYNE) 100 MG tablet, Take 300 mg by mouth 2 (two) times daily.   Current Outpatient Medications (Respiratory):  .  cetirizine (ZYRTEC) 10 MG tablet, Take 10 mg by mouth daily. Marland Kitchen  ipratropium (ATROVENT) 0.06 % nasal spray, Place 42 sprays into the nose 2 (two) times daily as needed.  Current Outpatient Medications (Analgesics):  .  acetaminophen (TYLENOL) 500 MG tablet, Take 1,000 mg by mouth every 6 (six) hours as needed for moderate pain.  Current Outpatient Medications (Hematological):  .  vitamin B-12 (CYANOCOBALAMIN) 1000 MCG tablet, Take 1,000 mcg by mouth daily.  Current Outpatient Medications (Other):  .  amitriptyline (ELAVIL) 25 MG tablet, Take 25 mg by mouth daily. Marland Kitchen  amoxicillin (AMOXIL) 500 MG capsule, Take 500 mg by mouth once as needed. Takes four capsules prior to dental procedures .  gabapentin (NEURONTIN) 100 MG capsule, Take 100 mg by mouth at bedtime. Take 3 capsules .  lactulose (CHRONULAC) 10 GM/15ML solution, Take 10 g by mouth 3 (three) times daily with meals. .  Multiple Vitamin (MULTIVITAMIN WITH MINERALS) TABS, Take 1 tablet by mouth daily. Women vitamin .  Multiple Vitamins-Minerals (HAIR/SKIN/NAILS) TABS, Take 1 tablet by mouth daily. .  ondansetron (ZOFRAN-ODT) 4 MG disintegrating tablet, Take 4 mg by mouth as needed. Marland Kitchen  RENVELA 0.8 g PACK packet, Take 0.8 g by mouth 3 (three) times daily with meals. Takes 4 packets with meals Takes 3 packets with snack  Ht: Ht Readings  from Last 1 Encounters:  01/16/19 5' 2.5" (1.588 m)     Wt:  Wt Readings from Last 3 Encounters:  01/16/19 143 lb 8.3 oz (65.1 kg)  11/30/15 120 lb 9.5 oz (54.7 kg)  06/26/15 130 lb (59 kg)     BMI: 25.83     Current tobacco use? No  Labs:  Lipid Panel     Component Value Date/Time   CHOL  08/03/2009 0819    174        ATP III CLASSIFICATION:  <200     mg/dL   Desirable  200-239  mg/dL   Borderline High  >=240    mg/dL   High          TRIG 122 08/03/2009 0819   HDL 57 08/03/2009 0819   CHOLHDL 3.1 08/03/2009 0819   VLDL 24 08/03/2009 0819   LDLCALC  08/03/2009 0819    93        Total Cholesterol/HDL:CHD Risk Coronary Heart Disease Risk Table                     Men   Women  1/2 Average Risk   3.4   3.3  Average Risk       5.0   4.4  2 X Average Risk   9.6   7.1  3 X Average Risk  23.4   11.0        Use  the calculated Patient Ratio above and the CHD Risk Table to determine the patient's CHD Risk.        ATP III CLASSIFICATION (LDL):  <100     mg/dL   Optimal  100-129  mg/dL   Near or Above                    Optimal  130-159  mg/dL   Borderline  160-189  mg/dL   High  >190     mg/dL   Very High    Lab Results  Component Value Date   HGBA1C 4.8 02/16/2011    Nutrition Diagnosis ? Food-and nutrition-related knowledge deficit related to lack of exposure to information as related to diagnosis of pulmonary disease  Goal(s) 1. The pt will continue to consume renal diet 2. Identify food quantities necessary to achieve wt loss of  -2# per week to a goal wt loss of 6-24 lb at graduation from pulmonary rehab.  Plan:  Pt to attend Pulmonary Nutrition class Will provide client-centered nutrition education as part of interdisciplinary care.    Monitor and Evaluate progress toward nutrition goal with team.   Laurina Bustle, MS, RD, LDN 01/18/2019 3:37 PM

## 2019-01-24 ENCOUNTER — Encounter (HOSPITAL_COMMUNITY)
Admission: RE | Admit: 2019-01-24 | Discharge: 2019-01-24 | Disposition: A | Discharge status: Home / Self Care | Payer: Medicare Other | Source: Ambulatory Visit | Attending: Pulmonary Disease | Admitting: Pulmonary Disease

## 2019-01-24 DIAGNOSIS — J984 Other disorders of lung: Secondary | ICD-10-CM | POA: Insufficient documentation

## 2019-01-24 NOTE — Progress Notes (Signed)
Pulmonary Individual Treatment Plan  Patient Details  Name: Kathleen [name] MRN: [id number] Date of Birth: July 23, 1966 Referring Provider:     Pulmonary Rehab Walk Test from 01/17/2019 in [place]  Referring Provider  Dr. Tanna Savoy      Initial Encounter Date:    Pulmonary Rehab Walk Test from 01/17/2019 in [place]  Date  01/17/19      Visit Diagnosis: Restrictive lung disease  Patient's Home Medications on Admission:   Current Outpatient Medications:  .  acetaminophen (TYLENOL) 500 MG tablet, Take 1,000 mg by mouth every 6 (six) hours as needed for moderate pain., Disp: , Rfl:  .  amitriptyline (ELAVIL) 25 MG tablet, Take 25 mg by mouth daily., Disp: , Rfl:  .  amoxicillin (AMOXIL) 500 MG capsule, Take 500 mg by mouth once as needed. Takes four capsules prior to dental procedures, Disp: , Rfl:  .  cetirizine (ZYRTEC) 10 MG tablet, Take 10 mg by mouth daily., Disp: , Rfl:  .  gabapentin (NEURONTIN) 100 MG capsule, Take 100 mg by mouth at bedtime. Take 3 capsules, Disp: , Rfl:  .  ipratropium (ATROVENT) 0.06 % nasal spray, Place 42 sprays into the nose 2 (two) times daily as needed., Disp: , Rfl:  .  labetalol (NORMODYNE) 100 MG tablet, Take 300 mg by mouth 2 (two) times daily. , Disp: , Rfl:  .  lactulose (CHRONULAC) 10 GM/15ML solution, Take 10 g by mouth 3 (three) times daily with meals., Disp: , Rfl:  .  Multiple Vitamin (MULTIVITAMIN WITH MINERALS) TABS, Take 1 tablet by mouth daily. Women vitamin, Disp: , Rfl:  .  Multiple Vitamins-Minerals (HAIR/SKIN/NAILS) TABS, Take 1 tablet by mouth daily., Disp: , Rfl:  .  ondansetron (ZOFRAN-ODT) 4 MG disintegrating tablet, Take 4 mg by mouth as needed., Disp: , Rfl:  .  predniSONE (DELTASONE) 5 MG tablet, Take 5 mg by mouth daily with breakfast., Disp: , Rfl:  .  RENVELA 0.8 g PACK packet, Take 0.8 g by mouth 3 (three) times daily with meals. Takes 4 packets with meals Takes 3  packets with snack, Disp: , Rfl:  .  vitamin B-12 (CYANOCOBALAMIN) 1000 MCG tablet, Take 1,000 mcg by mouth daily., Disp: , Rfl:   Past Medical History: Past Medical History:  Diagnosis Date  . Anemia    iron def,   . Chronic kidney disease   . Hypertension   . Rheumatoid arthritis of the hand     Tobacco Use: Social History   Tobacco Use  Smoking Status Never Smoker  Smokeless Tobacco Never Used    Labs: Recent Review Flowsheet Data    Labs for ITP Cardiac and Pulmonary Rehab Latest Ref Rng & Units 03/16/2009 05/11/2009 07/06/2009 08/03/2009 02/16/2011   Cholestrol 0 - 200 mg/dL 191 ATP III CLASSIFICATION: <200     mg/dL   Desirable 200-239  mg/dL   Borderline High >=240    mg/dL   High 182 ATP III CLASSIFICATION: <200     mg/dL   Desirable 200-239  mg/dL   Borderline High >=240    mg/dL   High 182 ATP III CLASSIFICATION: <200     mg/dL   Desirable 200-239  mg/dL   Borderline High >=240    mg/dL   High 174 ATP III CLASSIFICATION: <200     mg/dL   Desirable 200-239  mg/dL   Borderline High >=240    mg/dL   High -   LDLCALC 0 -  99 mg/dL 109 Total Cholesterol/HDL:CHD Risk Coronary Heart Disease Risk Table Men   Women 1/2 Average Risk   3.4   3.3 Average Risk       5.0   4.4 2 X Average Risk   9.6   7.1 3 X Average Risk  23.4   11.0 Use the calculated Patient Ratio above and the CHD Risk Table to determine the patient's CHD Risk. ATP III CLASSIFICATION (LDL): <100     mg/dL   Optimal 100-129  mg/dL   Near or Above Optimal 130-159  mg/dL   Borderline 160-189  mg/dL   High >190     mg/dL   Very High(H) 89 Total Cholesterol/HDL:CHD Risk Coronary Heart Disease Risk Table Men   Women 1/2 Average Risk   3.4   3.3 Average Risk       5.0   4.4 2 X Average Risk   9.6   7.1 3 X Average Risk  23.4   11.0 Use the calculated Patient Ratio above and the CHD Risk Table to determine the patient's CHD Risk. ATP III CLASSIFICATION (LDL): <100     mg/dL   Optimal 100-129   mg/dL   Near or Above Optimal 130-159  mg/dL   Borderline 160-189  mg/dL   High >190     mg/dL   Very High 104 Total Cholesterol/HDL:CHD Risk Coronary Heart Disease Risk Table Men   Women 1/2 Average Risk   3.4   3.3 Average Risk       5.0   4.4 2 X Average Risk   9.6   7.1 3 X Average Risk  23.4   11.0 Use the calculated Patient Ratio above and the CHD Risk Table to determine the patient's CHD Risk. ATP III CLASSIFICATION (LDL): <100     mg/dL   Optimal 100-129  mg/dL   Near or Above Optimal 130-159  mg/dL   Borderline 160-189  mg/dL   High >190     mg/dL   Very High(H) 93 Total Cholesterol/HDL:CHD Risk Coronary Heart Disease Risk Table Men   Women 1/2 Average Risk   3.4   3.3 Average Risk       5.0   4.4 2 X Average Risk   9.6   7.1 3 X Average Risk  23.4   11.0 Use the calculated Patient Ratio above and the CHD Risk Table to determine the patient's CHD Risk. ATP III CLASSIFICATION (LDL): <100     mg/dL   Optimal 100-129  mg/dL   Near or Above Optimal 130-159  mg/dL   Borderline 160-189  mg/dL   High >190     mg/dL   Very High -   HDL >39 mg/dL 57 48 57 57 -   Trlycerides <150 mg/dL 127 223(H) 106 122 -   Hemoglobin A1c 4.0 - 6.0 % 5.1 (NOTE) The ADA recommends the following therapeutic goal for glycemic control related to Hgb A1c measurement: Goal of therapy: <6.5 Hgb A1c  Reference: American Diabetes Association: Clinical Practice Recommendations 2010, Diabetes Care, 2010, 33: (Suppl 1). 5.3 (NOTE) The ADA recommends the following therapeutic goal for glycemic control related to Hgb A1c measurement: Goal of therapy: <6.5 Hgb A1c  Reference: American Diabetes Association: Clinical Practice Recommendations 2010, Diabetes Care, 2010, 33: (Suppl 1). - - 4.8      Capillary Blood Glucose: No results found for: GLUCAP   Pulmonary Assessment Scores: Pulmonary Assessment Scores    Row Name 01/16/19 1235 01/18/19 1030  ADL UCSD   ADL Phase  Entry  Entry     SOB Score total  19  -      CAT Score   CAT Score  34  -      mMRC Score   mMRC Score  -  1       Pulmonary Function Assessment:   Exercise Target Goals: Exercise Program Goal: Individual exercise prescription set using results from initial 6 min walk test and THRR while considering  patient's activity barriers and safety.   Exercise Prescription Goal: Initial exercise prescription builds to 30-45 minutes a day of aerobic activity, 2-3 days per week.  Home exercise guidelines will be given to patient during program as part of exercise prescription that the participant will acknowledge.  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 HR  135 bpm     Max Ex. BP  204/100     2 Minute Post BP  180/92 174/90       Interval HR   1 Minute HR  110     2 Minute HR  110     3 Minute HR  127     4 Minute HR  132     5 Minute HR  135     6 Minute HR  132     2 Minute Post HR  110     Interval Heart Rate?  Yes       Interval Oxygen   Interval Oxygen?  Yes     Baseline Oxygen Saturation %  94 %     1 Minute Oxygen Saturation %  89 %     1 Minute Liters of Oxygen  0 L     2 Minute Oxygen Saturation %  89 %     2 Minute Liters of Oxygen  0 L     3 Minute Oxygen Saturation %  90 %     3 Minute Liters of Oxygen  0 L     4 Minute Oxygen Saturation %  89  %     4 Minute Liters of Oxygen  0 L     5 Minute Oxygen Saturation %  89 %     5 Minute Liters of Oxygen  0 L     6 Minute Oxygen Saturation %  89 %     6 Minute Liters of Oxygen  0 L  2 Minute Post Oxygen Saturation %  91 %     2 Minute Post Liters of Oxygen  0 L        Oxygen Initial Assessment: Oxygen Initial Assessment - 01/18/19 1029      Initial 6 min Walk   Oxygen Used  None      Program Oxygen Prescription   Program Oxygen Prescription  None       Oxygen Re-Evaluation:   Oxygen Discharge (Final Oxygen Re-Evaluation):   Initial Exercise Prescription: Initial Exercise Prescription - 01/18/19 1000      Date of Initial Exercise RX and Referring Provider   Date  01/17/19    Referring Provider  Dr. Tanna Savoy      Recumbant Bike   Level  1    Watts  10    Minutes  17      NuStep   Level  2    SPM  80    Minutes  17    METs  1.5      Track   Laps  10    Minutes  17      Prescription Details   Frequency (times per week)  2    Duration  Progress to 45 minutes of aerobic exercise without signs/symptoms of physical distress      Intensity   THRR 40-80% of Max Heartrate  68-135    Ratings of Perceived Exertion  11-13    Perceived Dyspnea  0-4      Progression   Progression  Continue to progress workloads to maintain intensity without signs/symptoms of physical distress.      Resistance Training   Training Prescription  Yes    Weight  orange bands    Reps  10-15       Perform Capillary Blood Glucose checks as needed.  Exercise Prescription Changes:   Exercise Comments:   Exercise Goals and Review: Exercise Goals    Row Name 01/16/19 1027             Exercise Goals   Increase Physical Activity  Yes       Intervention  Provide advice, education, support and counseling about physical activity/exercise needs.;Develop an individualized exercise prescription for aerobic and resistive training based on initial evaluation findings, risk  stratification, comorbidities and participant's personal goals.       Expected Outcomes  Short Term: Attend rehab on a regular basis to increase amount of physical activity.;Long Term: Add in home exercise to make exercise part of routine and to increase amount of physical activity.;Long Term: Exercising regularly at least 3-5 days a week.       Increase Strength and Stamina  Yes       Intervention  Provide advice, education, support and counseling about physical activity/exercise needs.;Develop an individualized exercise prescription for aerobic and resistive training based on initial evaluation findings, risk stratification, comorbidities and participant's personal goals.       Expected Outcomes  Short Term: Increase workloads from initial exercise prescription for resistance, speed, and METs.;Short Term: Perform resistance training exercises routinely during rehab and add in resistance training at home;Long Term: Improve cardiorespiratory fitness, muscular endurance and strength as measured by increased METs and functional capacity (6MWT)       Able to understand and use rate of perceived exertion (RPE) scale  Yes       Intervention  Provide education and explanation on how to use RPE scale       Expected Outcomes  Short  Term: Able to use RPE daily in rehab to express subjective intensity level;Long Term:  Able to use RPE to guide intensity level when exercising independently       Able to understand and use Dyspnea scale  Yes       Intervention  Provide education and explanation on how to use Dyspnea scale       Expected Outcomes  Short Term: Able to use Dyspnea scale daily in rehab to express subjective sense of shortness of breath during exertion;Long Term: Able to use Dyspnea scale to guide intensity level when exercising independently       Knowledge and understanding of Target Heart Rate Range (THRR)  Yes       Intervention  Provide education and explanation of THRR including how the numbers  were predicted and where they are located for reference       Expected Outcomes  Short Term: Able to state/look up THRR;Long Term: Able to use THRR to govern intensity when exercising independently;Short Term: Able to use daily as guideline for intensity in rehab       Understanding of Exercise Prescription  Yes       Intervention  Provide education, explanation, and written materials on patient's individual exercise prescription       Expected Outcomes  Short Term: Able to explain program exercise prescription;Long Term: Able to explain home exercise prescription to exercise independently          Exercise Goals Re-Evaluation : Exercise Goals Re-Evaluation    Row Name 01/22/19 0859             Exercise Goal Re-Evaluation   Comments  Patient's first day of rehab will be on 01/24/19. Will monitor and progress as able.          Discharge Exercise Prescription (Final Exercise Prescription Changes):   Nutrition:  Target Goals: Understanding of nutrition guidelines, daily intake of sodium 1500mg , cholesterol 200mg , calories 30% from fat and 7% or less from saturated fats, daily to have 5 or more servings of fruits and vegetables.  Biometrics:    Nutrition Therapy Plan and Nutrition Goals: Nutrition Therapy & Goals - 01/18/19 1544      Nutrition Therapy   Diet  renal       Personal Nutrition Goals   Nutrition Goal  The pt will continue to consume renal diet    Personal Goal #2  Identify food quantities necessary to achieve wt loss of  -2# per week to a goal wt loss of 6-24 lb at graduation from pulmonary rehab.      Intervention Plan   Intervention  Prescribe, educate and counsel regarding individualized specific dietary modifications aiming towards targeted core components such as weight, hypertension, lipid management, diabetes, heart failure and other comorbidities.    Expected Outcomes  Short Term Goal: Understand basic principles of dietary content, such as calories, fat,  sodium, cholesterol and nutrients.;Long Term Goal: Adherence to prescribed nutrition plan.       Nutrition Assessments: Nutrition Assessments - 01/18/19 1547      Rate Your Plate Scores   Pre Score  39       Nutrition Goals Re-Evaluation: Nutrition Goals Re-Evaluation    [place] Name 01/18/19 1544             Goals   Current Weight  143 lb (64.9 kg)          Nutrition Goals Discharge (Final Nutrition Goals Re-Evaluation): Nutrition Goals Re-Evaluation - 01/18/19 1544  Goals   Current Weight  143 lb (64.9 kg)       Psychosocial: Target Goals: Acknowledge presence or absence of significant depression and/or stress, maximize coping skills, provide positive support system. Participant is able to verbalize types and ability to use techniques and skills needed for reducing stress and depression.  Initial Review & Psychosocial Screening: Initial Psych Review & Screening - 01/16/19 1119      Initial Review   Current issues with  None Identified      Family Dynamics   Good Support System?  Yes    Comments  son       Barriers   Psychosocial barriers to participate in program  The patient should benefit from training in stress management and relaxation.;There are no identifiable barriers or psychosocial needs.      Screening Interventions   Expected Outcomes  Long Term goal: The participant improves quality of Life and PHQ9 Scores as seen by post scores and/or verbalization of changes;Short Term goal: Identification and review with participant of any Quality of Life or Depression concerns found by scoring the questionnaire.       Quality of Life Scores:  Scores of 19 and below usually indicate a poorer quality of life in these areas.  A difference of  2-3 points is a clinically meaningful difference.  A difference of 2-3 points in the total score of the Quality of Life Index has been associated with significant improvement in overall quality of life, self-image, physical  symptoms, and general health in studies assessing change in quality of life.  PHQ-9: Recent Review Flowsheet Data    Depression screen [facility] 2/9 01/16/2019   Decreased Interest 0   Down, Depressed, Hopeless 0   PHQ - 2 Score 0   Altered sleeping 1   Tired, decreased energy 1   Change in appetite 0   Feeling bad or failure about yourself  0   Trouble concentrating 1   Moving slowly or fidgety/restless 0   Suicidal thoughts 0   PHQ-9 Score 3   Difficult doing work/chores Not difficult at all     Interpretation of Total Score  Total Score Depression Severity:  1-4 = Minimal depression, 5-9 = Mild depression, 10-14 = Moderate depression, 15-19 = Moderately severe depression, 20-27 = Severe depression   Psychosocial Evaluation and Intervention:   Psychosocial Re-Evaluation: Psychosocial Re-Evaluation    [place] Name 01/22/19 1555             Psychosocial Re-Evaluation   Comments  Has not started the program yet.       Expected Outcomes  No barriers to participation in pulmonary rehab       Interventions  Encouraged to attend Pulmonary Rehabilitation for the exercise;Relaxation education;Stress management education          Psychosocial Discharge (Final Psychosocial Re-Evaluation): Psychosocial Re-Evaluation - 01/22/19 1555      Psychosocial Re-Evaluation   Comments  Has not started the program yet.    Expected Outcomes  No barriers to participation in pulmonary rehab    Interventions  Encouraged to attend Pulmonary Rehabilitation for the exercise;Relaxation education;Stress management education       Education: Education Goals: Education classes will be provided on a weekly basis, covering required topics. Participant will state understanding/return demonstration of topics presented.  Learning Barriers/Preferences: Learning Barriers/Preferences - 01/16/19 1122      Learning Barriers/Preferences   Learning Barriers  Sight    Learning Preferences  Individual  Instruction;Group Instruction;Audio;Skilled Demonstration;Verbal Instruction;Written  Material       Education Topics: Risk Factor Reduction:  -Group instruction that is supported by a PowerPoint presentation. Instructor discusses the definition of a risk factor, different risk factors for pulmonary disease, and how the heart and lungs work together.     Nutrition for Pulmonary Patient:  -Group instruction provided by PowerPoint slides, verbal discussion, and written materials to support subject matter. The instructor gives an explanation and review of healthy diet recommendations, which includes a discussion on weight management, recommendations for fruit and vegetable consumption, as well as protein, fluid, caffeine, fiber, sodium, sugar, and alcohol. Tips for eating when patients are short of breath are discussed.   Pursed Lip Breathing:  -Group instruction that is supported by demonstration and informational handouts. Instructor discusses the benefits of pursed lip and diaphragmatic breathing and detailed demonstration on how to preform both.     Oxygen Safety:  -Group instruction provided by PowerPoint, verbal discussion, and written material to support subject matter. There is an overview of "What is Oxygen" and "Why do we need it".  Instructor also reviews how to create a safe environment for oxygen use, the importance of using oxygen as prescribed, and the risks of noncompliance. There is a brief discussion on traveling with oxygen and resources the patient may utilize.   Oxygen Equipment:  -Group instruction provided by [facility] Staff utilizing handouts, written materials, and equipment demonstrations.   Signs and Symptoms:  -Group instruction provided by written material and verbal discussion to support subject matter. Warning signs and symptoms of infection, stroke, and heart attack are reviewed and when to call the physician/911 reinforced. Tips for preventing the spread of  infection discussed.   Advanced Directives:  -Group instruction provided by verbal instruction and written material to support subject matter. Instructor reviews Advanced Directive laws and proper instruction for filling out document.   Pulmonary Video:  -Group video education that reviews the importance of medication and oxygen compliance, exercise, good nutrition, pulmonary hygiene, and pursed lip and diaphragmatic breathing for the pulmonary patient.   Exercise for the Pulmonary Patient:  -Group instruction that is supported by a PowerPoint presentation. Instructor discusses benefits of exercise, core components of exercise, frequency, duration, and intensity of an exercise routine, importance of utilizing pulse oximetry during exercise, safety while exercising, and options of places to exercise outside of rehab.     Pulmonary Medications:  -Verbally interactive group education provided by instructor with focus on inhaled medications and proper administration.   Anatomy and Physiology of the Respiratory System and Intimacy:  -Group instruction provided by PowerPoint, verbal discussion, and written material to support subject matter. Instructor reviews respiratory cycle and anatomical components of the respiratory system and their functions. Instructor also reviews differences in obstructive and restrictive respiratory diseases with examples of each. Intimacy, Sex, and Sexuality differences are reviewed with a discussion on how relationships can change when diagnosed with pulmonary disease. Common sexual concerns are reviewed.   MD DAY -A group question and answer session with a medical doctor that allows participants to ask questions that relate to their pulmonary disease state.   OTHER EDUCATION -Group or individual verbal, written, or video instructions that support the educational goals of the pulmonary rehab program.   Holiday Eating Survival Tips:  -Group instruction provided  by PowerPoint slides, verbal discussion, and written materials to support subject matter. The instructor gives patients tips, tricks, and techniques to help them not only survive but enjoy the holidays despite the onslaught of food  that accompanies the holidays.   Knowledge Questionnaire Score: Knowledge Questionnaire Score - 01/16/19 1241      Knowledge Questionnaire Score   Pre Score  17/18       Core Components/Risk Factors/Patient Goals at Admission: Personal Goals and Risk Factors at Admission - 01/16/19 1122      Core Components/Risk Factors/Patient Goals on Admission    Weight Management  Yes    Intervention  Weight Management/Obesity: Establish reasonable short term and long term weight goals.;Weight Management: Provide education and appropriate resources to help participant work on and attain dietary goals.;Weight Management: Develop a combined nutrition and exercise program designed to reach desired caloric intake, while maintaining appropriate intake of nutrient and fiber, sodium and fats, and appropriate energy expenditure required for the weight goal.    Admit Weight  143 lb 8.3 oz (65.1 kg)    Goal Weight: Short Term  135 lb (61.2 kg)    Goal Weight: Long Term  128 lb (58.1 kg)    Expected Outcomes  Understanding of distribution of calorie intake throughout the day with the consumption of 4-5 meals/snacks;Understanding recommendations for meals to include 15-35% energy as protein, 25-35% energy from fat, 35-60% energy from carbohydrates, less than 200mg  of dietary cholesterol, 20-35 gm of total fiber daily;Weight Loss: Understanding of general recommendations for a balanced deficit meal plan, which promotes 1-2 lb weight loss per week and includes a negative energy balance of [phone] kcal/d;Short Term: Continue to assess and modify interventions until short term weight is achieved;Long Term: Adherence to nutrition and physical activity/exercise program aimed toward attainment of  established weight goal    Improve shortness of breath with ADL's  Yes    Intervention  Provide education, individualized exercise plan and daily activity instruction to help decrease symptoms of SOB with activities of daily living.    Expected Outcomes  Short Term: Improve cardiorespiratory fitness to achieve a reduction of symptoms when performing ADLs;Long Term: Be able to perform more ADLs without symptoms or delay the onset of symptoms    Hypertension  Yes    Intervention  Provide education on lifestyle modifcations including regular physical activity/exercise, weight management, moderate sodium restriction and increased consumption of fresh fruit, vegetables, and low fat dairy, alcohol moderation, and smoking cessation.;Monitor prescription use compliance.    Expected Outcomes  Short Term: Continued assessment and intervention until BP is < 140/15mm HG in hypertensive participants. < 130/23mm HG in hypertensive participants with diabetes, heart failure or chronic kidney disease.;Long Term: Maintenance of blood pressure at goal levels.       Core Components/Risk Factors/Patient Goals Review:  Goals and Risk Factor Review    Row Name 01/22/19 1604             Core Components/Risk Factors/Patient Goals Review   Personal Goals Review  Increase knowledge of respiratory medications and ability to use respiratory devices properly.;Improve shortness of breath with ADL's;Develop more efficient breathing techniques such as purse lipped breathing and diaphragmatic breathing and practicing self-pacing with activity.;Weight Management/Obesity       Review  Has not started the program as of yet, but will in the next 2 days       Expected Outcomes  See admission goals          Core Components/Risk Factors/Patient Goals at Discharge (Final Review):  Goals and Risk Factor Review - 01/22/19 1604      Core Components/Risk Factors/Patient Goals Review   Personal Goals Review  Increase knowledge of  respiratory medications  and ability to use respiratory devices properly.;Improve shortness of breath with ADL's;Develop more efficient breathing techniques such as purse lipped breathing and diaphragmatic breathing and practicing self-pacing with activity.;Weight Management/Obesity    Review  Has not started the program as of yet, but will in the next 2 days    Expected Outcomes  See admission goals       ITP Comments: ITP Comments    [place] Name 01/16/19 1015           ITP Comments  Dr. Manfred Arch, Medical Director for Pulmonary Rehab          Comments: ITP REVIEW Pt is making expected progress toward pulmonary rehab goals after completing 0 sessions. Recommend continued exercise, life style modification, education, and utilization of breathing techniques to increase stamina and strength and decrease shortness of breath with exertion.

## 2019-01-24 NOTE — Progress Notes (Signed)
Daily Session Note  Patient Details  Name: Kathleen [name] MRN: [id number] Date of Birth: 01/23/1966 Referring Provider:     Pulmonary Rehab Walk Test from 01/17/2019 in [facility]  Referring Provider  Dr. Yacouv      Encounter Date: 01/24/2019  Check In: Session Check In - 01/24/19 1330      Check-In   Supervising physician immediately available to respond to emergencies  [facility] immediately available    Physician(s)  Dr. Rizwan    Location  [facility]    Staff Present  Lindsay Agnew RN, BSN;Molly DiVincenzo, MS, ACSM RCEP, Exercise Physiologist;Dalton Fletcher, MS, Exercise Physiologist;Joan Behrens, RN, BSN;Carlette Carlton, RN, BSN    Medication changes reported      No    Fall or balance concerns reported     No    Tobacco Cessation  No Change    Warm-up and Cool-down  Performed as group-led instruction    Resistance Training Performed  Yes    VAD Patient?  No    PAD/SET Patient?  No      Pain Assessment   Currently in Pain?  No/denies    Multiple Pain Sites  No       Capillary Blood Glucose: No results found for this or any previous visit (from the past 24 hour(s)).    Social History   Tobacco Use  Smoking Status Never Smoker  Smokeless Tobacco Never Used    Goals Met:  Proper associated with RPD/PD & O2 Sat Exercise tolerated well Strength training completed today  Goals Unmet:  Not Applicable  Comments: Service time is from 1330 to 1515    Dr. Wesam G. Yacoub is Medical Director for Pulmonary Rehab at  Hospital. 

## 2019-01-28 ENCOUNTER — Telehealth (HOSPITAL_COMMUNITY): Payer: Self-pay | Admitting: General Practice

## 2019-01-29 ENCOUNTER — Encounter (HOSPITAL_COMMUNITY)
Admission: RE | Admit: 2019-01-29 | Discharge: 2019-01-29 | Disposition: A | Discharge status: Home / Self Care | Payer: Medicare Other | Source: Ambulatory Visit | Attending: Pulmonary Disease | Admitting: Pulmonary Disease

## 2019-01-29 DIAGNOSIS — J984 Other disorders of lung: Secondary | ICD-10-CM

## 2019-01-29 NOTE — Progress Notes (Signed)
Daily Session Note  Patient Details  Name: Kathleen [name] MRN: [id number] Date of Birth: 22-Jul-1966 Referring Provider:     Pulmonary Rehab Walk Test from 01/17/2019 in [place]  Referring Provider  Dr. Tanna Savoy      Encounter Date: 01/29/2019  Check In: Session Check In - 01/29/19 1127      Check-In   Supervising physician immediately available to respond to emergencies  [facility] immediately available    Physician(s)  Dr. Jonnie Finner    Location  [facility]    Staff Present  Su Hilt, MS, ACSM RCEP, Exercise Physiologist;Dalton Kris Mouton, MS, Exercise Physiologist;Joan Leonia Reeves, RN, BSN;Other    Medication changes reported      No    Fall or balance concerns reported     No    Tobacco Cessation  No Change    Warm-up and Cool-down  Performed as group-led instruction    Resistance Training Performed  Yes    VAD Patient?  No    PAD/SET Patient?  No      Pain Assessment   Currently in Pain?  No/denies    Multiple Pain Sites  No       Capillary Blood Glucose: No results found for this or any previous visit (from the past 24 hour(s)).    Social History   Tobacco Use  Smoking Status Never Smoker  Smokeless Tobacco Never Used    Goals Met:  Exercise tolerated well No report of cardiac concerns or symptoms Strength training completed today  Goals Unmet:  Not Applicable  Comments: Service time is from 1030 to 1215    Dr. Rush Farmer is Medical Director for Pulmonary Rehab at [facility].

## 2019-01-31 ENCOUNTER — Encounter (HOSPITAL_COMMUNITY)
Admission: RE | Admit: 2019-01-31 | Discharge: 2019-01-31 | Disposition: A | Discharge status: Home / Self Care | Payer: Medicare Other | Source: Ambulatory Visit | Attending: Pulmonary Disease | Admitting: Pulmonary Disease

## 2019-01-31 ENCOUNTER — Other Ambulatory Visit: Payer: Self-pay

## 2019-01-31 VITALS — Wt 144.2 lb

## 2019-01-31 DIAGNOSIS — J984 Other disorders of lung: Secondary | ICD-10-CM | POA: Diagnosis not present

## 2019-01-31 NOTE — Progress Notes (Signed)
Daily Session Note  Patient Details  Name: Kathleen [name] MRN: [id number] Date of Birth: [birth year] Referring Provider:     Pulmonary Rehab Walk Test from 01/17/2019 in [place]  Referring Provider  Dr. Tanna Savoy      Encounter Date: 01/31/2019  Check In: Session Check In - 01/31/19 1358      Check-In   Supervising physician immediately available to respond to emergencies  [facility] immediately available    Physician(s)  Dr. Cathlean Sauer    Location  [facility]    Staff Present  Joycelyn Man RN, BSN;Lianni Kanaan, MS, ACSM RCEP, Exercise Physiologist;Dalton Kris Mouton, MS, Exercise Physiologist;Lisa Ysidro Evert, RN    Medication changes reported      No    Fall or balance concerns reported     No    Tobacco Cessation  No Change    Warm-up and Cool-down  Performed as group-led instruction    Resistance Training Performed  Yes    VAD Patient?  No    PAD/SET Patient?  No      Pain Assessment   Currently in Pain?  No/denies    Pain Score  0-No pain    Multiple Pain Sites  No       Capillary Blood Glucose: No results found for this or any previous visit (from the past 24 hour(s)).    Social History   Tobacco Use  Smoking Status Never Smoker  Smokeless Tobacco Never Used    Goals Met:  Exercise tolerated well  Goals Unmet:  Not Applicable  Comments: Service time is from 1:30p to 3:15p    Dr. Rush Farmer is Medical Director for Pulmonary Rehab at [facility].

## 2019-02-05 ENCOUNTER — Encounter (HOSPITAL_COMMUNITY): Payer: Medicare Other

## 2019-02-05 ENCOUNTER — Telehealth (HOSPITAL_COMMUNITY): Payer: Self-pay

## 2019-02-07 ENCOUNTER — Encounter (HOSPITAL_COMMUNITY): Payer: Medicare Other

## 2019-02-11 ENCOUNTER — Encounter (HOSPITAL_COMMUNITY): Payer: Self-pay

## 2019-02-11 NOTE — Progress Notes (Signed)
Pulmonary Individual Treatment Plan  Patient Details  Name: Kathleen [name] MRN: [id number] Date of Birth: July 23, 1966 Referring Provider:     Pulmonary Rehab Walk Test from 01/17/2019 in [place]  Referring Provider  Dr. Tanna Savoy      Initial Encounter Date:    Pulmonary Rehab Walk Test from 01/17/2019 in [place]  Date  01/17/19      Visit Diagnosis: Restrictive lung disease  Patient's Home Medications on Admission:   Current Outpatient Medications:  .  acetaminophen (TYLENOL) 500 MG tablet, Take 1,000 mg by mouth every 6 (six) hours as needed for moderate pain., Disp: , Rfl:  .  amitriptyline (ELAVIL) 25 MG tablet, Take 25 mg by mouth daily., Disp: , Rfl:  .  amoxicillin (AMOXIL) 500 MG capsule, Take 500 mg by mouth once as needed. Takes four capsules prior to dental procedures, Disp: , Rfl:  .  cetirizine (ZYRTEC) 10 MG tablet, Take 10 mg by mouth daily., Disp: , Rfl:  .  gabapentin (NEURONTIN) 100 MG capsule, Take 100 mg by mouth at bedtime. Take 3 capsules, Disp: , Rfl:  .  ipratropium (ATROVENT) 0.06 % nasal spray, Place 42 sprays into the nose 2 (two) times daily as needed., Disp: , Rfl:  .  labetalol (NORMODYNE) 100 MG tablet, Take 300 mg by mouth 2 (two) times daily. , Disp: , Rfl:  .  lactulose (CHRONULAC) 10 GM/15ML solution, Take 10 g by mouth 3 (three) times daily with meals., Disp: , Rfl:  .  Multiple Vitamin (MULTIVITAMIN WITH MINERALS) TABS, Take 1 tablet by mouth daily. Women vitamin, Disp: , Rfl:  .  Multiple Vitamins-Minerals (HAIR/SKIN/NAILS) TABS, Take 1 tablet by mouth daily., Disp: , Rfl:  .  ondansetron (ZOFRAN-ODT) 4 MG disintegrating tablet, Take 4 mg by mouth as needed., Disp: , Rfl:  .  predniSONE (DELTASONE) 5 MG tablet, Take 5 mg by mouth daily with breakfast., Disp: , Rfl:  .  RENVELA 0.8 g PACK packet, Take 0.8 g by mouth 3 (three) times daily with meals. Takes 4 packets with meals Takes 3  packets with snack, Disp: , Rfl:  .  vitamin B-12 (CYANOCOBALAMIN) 1000 MCG tablet, Take 1,000 mcg by mouth daily., Disp: , Rfl:   Past Medical History: Past Medical History:  Diagnosis Date  . Anemia    iron def,   . Chronic kidney disease   . Hypertension   . Rheumatoid arthritis of the hand     Tobacco Use: Social History   Tobacco Use  Smoking Status Never Smoker  Smokeless Tobacco Never Used    Labs: Recent Review Flowsheet Data    Labs for ITP Cardiac and Pulmonary Rehab Latest Ref Rng & Units 03/16/2009 05/11/2009 07/06/2009 08/03/2009 02/16/2011   Cholestrol 0 - 200 mg/dL 191 ATP III CLASSIFICATION: <200     mg/dL   Desirable 200-239  mg/dL   Borderline High >=240    mg/dL   High 182 ATP III CLASSIFICATION: <200     mg/dL   Desirable 200-239  mg/dL   Borderline High >=240    mg/dL   High 182 ATP III CLASSIFICATION: <200     mg/dL   Desirable 200-239  mg/dL   Borderline High >=240    mg/dL   High 174 ATP III CLASSIFICATION: <200     mg/dL   Desirable 200-239  mg/dL   Borderline High >=240    mg/dL   High -   LDLCALC 0 -  99 mg/dL 109 Total Cholesterol/HDL:CHD Risk Coronary Heart Disease Risk Table Men   Women 1/2 Average Risk   3.4   3.3 Average Risk       5.0   4.4 2 X Average Risk   9.6   7.1 3 X Average Risk  23.4   11.0 Use the calculated Patient Ratio above and the CHD Risk Table to determine the patient's CHD Risk. ATP III CLASSIFICATION (LDL): <100     mg/dL   Optimal 100-129  mg/dL   Near or Above Optimal 130-159  mg/dL   Borderline 160-189  mg/dL   High >190     mg/dL   Very High(H) 89 Total Cholesterol/HDL:CHD Risk Coronary Heart Disease Risk Table Men   Women 1/2 Average Risk   3.4   3.3 Average Risk       5.0   4.4 2 X Average Risk   9.6   7.1 3 X Average Risk  23.4   11.0 Use the calculated Patient Ratio above and the CHD Risk Table to determine the patient's CHD Risk. ATP III CLASSIFICATION (LDL): <100     mg/dL   Optimal 100-129   mg/dL   Near or Above Optimal 130-159  mg/dL   Borderline 160-189  mg/dL   High >190     mg/dL   Very High 104 Total Cholesterol/HDL:CHD Risk Coronary Heart Disease Risk Table Men   Women 1/2 Average Risk   3.4   3.3 Average Risk       5.0   4.4 2 X Average Risk   9.6   7.1 3 X Average Risk  23.4   11.0 Use the calculated Patient Ratio above and the CHD Risk Table to determine the patient's CHD Risk. ATP III CLASSIFICATION (LDL): <100     mg/dL   Optimal 100-129  mg/dL   Near or Above Optimal 130-159  mg/dL   Borderline 160-189  mg/dL   High >190     mg/dL   Very High(H) 93 Total Cholesterol/HDL:CHD Risk Coronary Heart Disease Risk Table Men   Women 1/2 Average Risk   3.4   3.3 Average Risk       5.0   4.4 2 X Average Risk   9.6   7.1 3 X Average Risk  23.4   11.0 Use the calculated Patient Ratio above and the CHD Risk Table to determine the patient's CHD Risk. ATP III CLASSIFICATION (LDL): <100     mg/dL   Optimal 100-129  mg/dL   Near or Above Optimal 130-159  mg/dL   Borderline 160-189  mg/dL   High >190     mg/dL   Very High -   HDL >39 mg/dL 57 48 57 57 -   Trlycerides <150 mg/dL 127 223(H) 106 122 -   Hemoglobin A1c 4.0 - 6.0 % 5.1 (NOTE) The ADA recommends the following therapeutic goal for glycemic control related to Hgb A1c measurement: Goal of therapy: <6.5 Hgb A1c  Reference: American Diabetes Association: Clinical Practice Recommendations 2010, Diabetes Care, 2010, 33: (Suppl 1). 5.3 (NOTE) The ADA recommends the following therapeutic goal for glycemic control related to Hgb A1c measurement: Goal of therapy: <6.5 Hgb A1c  Reference: American Diabetes Association: Clinical Practice Recommendations 2010, Diabetes Care, 2010, 33: (Suppl 1). - - 4.8      Capillary Blood Glucose: No results found for: GLUCAP   Pulmonary Assessment Scores: Pulmonary Assessment Scores    Row Name 01/16/19 1235 01/18/19 1030  ADL UCSD   ADL Phase  Entry  Entry     SOB Score total  19  -      CAT Score   CAT Score  34  -      mMRC Score   mMRC Score  -  1       Pulmonary Function Assessment:   Exercise Target Goals: Exercise Program Goal: Individual exercise prescription set using results from initial 6 min walk test and THRR while considering  patient's activity barriers and safety.   Exercise Prescription Goal: Initial exercise prescription builds to 30-45 minutes a day of aerobic activity, 2-3 days per week.  Home exercise guidelines will be given to patient during program as part of exercise prescription that the participant will acknowledge.  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 HR  135 bpm     Max Ex. BP  204/100     2 Minute Post BP  180/92 174/90       Interval HR   1 Minute HR  110     2 Minute HR  110     3 Minute HR  127     4 Minute HR  132     5 Minute HR  135     6 Minute HR  132     2 Minute Post HR  110     Interval Heart Rate?  Yes       Interval Oxygen   Interval Oxygen?  Yes     Baseline Oxygen Saturation %  94 %     1 Minute Oxygen Saturation %  89 %     1 Minute Liters of Oxygen  0 L     2 Minute Oxygen Saturation %  89 %     2 Minute Liters of Oxygen  0 L     3 Minute Oxygen Saturation %  90 %     3 Minute Liters of Oxygen  0 L     4 Minute Oxygen Saturation %  89  %     4 Minute Liters of Oxygen  0 L     5 Minute Oxygen Saturation %  89 %     5 Minute Liters of Oxygen  0 L     6 Minute Oxygen Saturation %  89 %     6 Minute Liters of Oxygen  0 L  2 Minute Post Oxygen Saturation %  91 %     2 Minute Post Liters of Oxygen  0 L        Oxygen Initial Assessment: Oxygen Initial Assessment - 01/18/19 1029      Initial 6 min Walk   Oxygen Used  None      Program Oxygen Prescription   Program Oxygen Prescription  None       Oxygen Re-Evaluation: Oxygen Re-Evaluation    Row Name 02/11/19 1002             Program Oxygen Prescription   Program Oxygen Prescription  None         Home Oxygen   Home Oxygen Device  None       Sleep Oxygen Prescription  None       Home Exercise Oxygen Prescription  None       Home at Rest Exercise Oxygen Prescription  None         Goals/Expected Outcomes   Short Term Goals  To learn and exhibit compliance with exercise, home and travel O2 prescription;To learn and understand importance of monitoring SPO2 with pulse oximeter and demonstrate accurate use of the pulse oximeter.;To learn and understand importance of maintaining oxygen saturations>88%;To learn and demonstrate proper pursed lip breathing techniques or other breathing techniques.;To learn and demonstrate proper use of respiratory medications       Long  Term Goals  Exhibits compliance with exercise, home and travel O2 prescription;Verbalizes importance of monitoring SPO2 with pulse oximeter and return demonstration;Maintenance of O2 saturations>88%;Exhibits proper breathing techniques, such as pursed lip breathing or other method taught during program session;Compliance with respiratory medication;Demonstrates proper use of MDI's       Goals/Expected Outcomes  compliance          Oxygen Discharge (Final Oxygen Re-Evaluation): Oxygen Re-Evaluation - 02/11/19 1002      Program Oxygen Prescription   Program Oxygen Prescription  None      Home  Oxygen   Home Oxygen Device  None    Sleep Oxygen Prescription  None    Home Exercise Oxygen Prescription  None    Home at Rest Exercise Oxygen Prescription  None      Goals/Expected Outcomes   Short Term Goals  To learn and exhibit compliance with exercise, home and travel O2 prescription;To learn and understand importance of monitoring SPO2 with pulse oximeter and demonstrate accurate use of the pulse oximeter.;To learn and understand importance of maintaining oxygen saturations>88%;To learn and demonstrate proper pursed lip breathing techniques or other breathing techniques.;To learn and demonstrate proper use of respiratory medications    Long  Term Goals  Exhibits compliance with exercise, home and travel O2 prescription;Verbalizes importance of monitoring SPO2 with pulse oximeter and return demonstration;Maintenance of O2 saturations>88%;Exhibits proper breathing techniques, such as pursed lip breathing or other method taught during program session;Compliance with respiratory medication;Demonstrates proper use of MDI's    Goals/Expected Outcomes  compliance       Initial Exercise Prescription: Initial Exercise Prescription - 01/18/19 1000      Date of Initial Exercise RX and Referring Provider   Date  01/17/19    Referring Provider  Dr. Tanna Savoy      Recumbant Bike   Level  1    Watts  10    Minutes  17      NuStep   Level  2    SPM  80    Minutes  17    METs  1.5      Track   Laps  10    Minutes  17      Prescription Details   Frequency (times per week)  2    Duration  Progress to 45 minutes of aerobic exercise without signs/symptoms of physical distress      Intensity   THRR 40-80% of Max Heartrate  68-135    Ratings of Perceived Exertion  11-13    Perceived Dyspnea  0-4      Progression   Progression  Continue to progress workloads to maintain intensity without signs/symptoms of physical distress.      Resistance Training   Training Prescription  Yes     Weight  orange bands    Reps  10-15       Perform Capillary Blood Glucose checks as needed.  Exercise Prescription Changes: Exercise Prescription Changes    Row Name 01/31/19 1126             Response to Exercise   Blood Pressure (Admit)  130/82       Blood Pressure (Exercise)  146/78       Blood Pressure (Exit)  132/70       Heart Rate (Admit)  103 bpm       Heart Rate (Exercise)  124 bpm       Heart Rate (Exit)  104 bpm       Oxygen Saturation (Admit)  93 %       Oxygen Saturation (Exercise)  90 %       Oxygen Saturation (Exit)  98 %       Rating of Perceived Exertion (Exercise)  13       Perceived Dyspnea (Exercise)  1       Duration  Progress to 45 minutes of aerobic exercise without signs/symptoms of physical distress       Intensity  - 40-80% HRR         Progression   Progression  Continue to progress workloads to maintain intensity without signs/symptoms of physical distress.         Resistance Training   Training Prescription  Yes       Weight  orange bands       Reps  10-15       Time  10 Minutes         Interval Training   Interval Training  No         Bike   Level  1.5 sci-fit       Watts  10       Minutes  17         NuStep   Level  2       SPM  80       Minutes  17       METs  1.8          Exercise Comments:   Exercise Goals and Review: Exercise Goals    Row Name 01/16/19 1027             Exercise Goals   Increase Physical Activity  Yes       Intervention  Provide advice, education, support and counseling about physical activity/exercise needs.;Develop an individualized exercise prescription for aerobic and resistive training based on initial evaluation findings, risk stratification, comorbidities and participant's personal goals.       Expected Outcomes  Short Term: Attend rehab on a regular basis to increase amount of physical activity.;Long Term: Add in  home exercise to make exercise part of routine and to increase amount of  physical activity.;Long Term: Exercising regularly at least 3-5 days a week.       Increase Strength and Stamina  Yes       Intervention  Provide advice, education, support and counseling about physical activity/exercise needs.;Develop an individualized exercise prescription for aerobic and resistive training based on initial evaluation findings, risk stratification, comorbidities and participant's personal goals.       Expected Outcomes  Short Term: Increase workloads from initial exercise prescription for resistance, speed, and METs.;Short Term: Perform resistance training exercises routinely during rehab and add in resistance training at home;Long Term: Improve cardiorespiratory fitness, muscular endurance and strength as measured by increased METs and functional capacity (6MWT)       Able to understand and use rate of perceived exertion (RPE) scale  Yes       Intervention  Provide education and explanation on how to use RPE scale       Expected Outcomes  Short Term: Able to use RPE daily in rehab to express subjective intensity level;Long Term:  Able to use RPE to guide intensity level when exercising independently       Able to understand and use Dyspnea scale  Yes       Intervention  Provide education and explanation on how to use Dyspnea scale       Expected Outcomes  Short Term: Able to use Dyspnea scale daily in rehab to express subjective sense of shortness of breath during exertion;Long Term: Able to use Dyspnea scale to guide intensity level when exercising independently       Knowledge and understanding of Target Heart Rate Range (THRR)  Yes       Intervention  Provide education and explanation of THRR including how the numbers were predicted and where they are located for reference       Expected Outcomes  Short Term: Able to state/look up THRR;Long Term: Able to use THRR to govern intensity when exercising independently;Short Term: Able to use daily as guideline for intensity in rehab        Understanding of Exercise Prescription  Yes       Intervention  Provide education, explanation, and written materials on patient's individual exercise prescription       Expected Outcomes  Short Term: Able to explain program exercise prescription;Long Term: Able to explain home exercise prescription to exercise independently          Exercise Goals Re-Evaluation : Exercise Goals Re-Evaluation    Row Name 01/22/19 0859 02/11/19 1002           Exercise Goal Re-Evaluation   Exercise Goals Review  -  Increase Physical Activity;Increase Strength and Stamina;Able to understand and use Dyspnea scale;Able to understand and use rate of perceived exertion (RPE) scale;Knowledge and understanding of Target Heart Rate Range (THRR);Understanding of Exercise Prescription      Comments  Patient's first day of rehab will be on 01/24/19. Will monitor and progress as able.  Pt has completed 3 exercise sessions. Pt was exercising at low MET levels. Our department is currently closed and will be for an unknown amount of time. Called pt on 3/16 and instructed pt on home exercise to be doing while we are on this layoff. Will follow up with pt and give updates after I recieve them this week.      Expected Outcomes  -  Through exercise at rehab and at home, the patient  will decrease shortness of breath with daily activities and feel confident in carrying out an exercise regime at home.          Discharge Exercise Prescription (Final Exercise Prescription Changes): Exercise Prescription Changes - 01/31/19 1126      Response to Exercise   Blood Pressure (Admit)  130/82    Blood Pressure (Exercise)  146/78    Blood Pressure (Exit)  132/70    Heart Rate (Admit)  103 bpm    Heart Rate (Exercise)  124 bpm    Heart Rate (Exit)  104 bpm    Oxygen Saturation (Admit)  93 %    Oxygen Saturation (Exercise)  90 %    Oxygen Saturation (Exit)  98 %    Rating of Perceived Exertion (Exercise)  13    Perceived Dyspnea  (Exercise)  1    Duration  Progress to 45 minutes of aerobic exercise without signs/symptoms of physical distress    Intensity  --   40-80% HRR     Progression   Progression  Continue to progress workloads to maintain intensity without signs/symptoms of physical distress.      Resistance Training   Training Prescription  Yes    Weight  orange bands    Reps  10-15    Time  10 Minutes      Interval Training   Interval Training  No      Bike   Level  1.5   sci-fit   Watts  10    Minutes  17      NuStep   Level  2    SPM  80    Minutes  17    METs  1.8       Nutrition:  Target Goals: Understanding of nutrition guidelines, daily intake of sodium '1500mg'$ , cholesterol '200mg'$ , calories 30% from fat and 7% or less from saturated fats, daily to have 5 or more servings of fruits and vegetables.  Biometrics:    Nutrition Therapy Plan and Nutrition Goals: Nutrition Therapy & Goals - 01/18/19 1544      Nutrition Therapy   Diet  renal       Personal Nutrition Goals   Nutrition Goal  The pt will continue to consume renal diet    Personal Goal #2  Identify food quantities necessary to achieve wt loss of  -2# per week to a goal wt loss of 6-24 lb at graduation from pulmonary rehab.      Intervention Plan   Intervention  Prescribe, educate and counsel regarding individualized specific dietary modifications aiming towards targeted core components such as weight, hypertension, lipid management, diabetes, heart failure and other comorbidities.    Expected Outcomes  Short Term Goal: Understand basic principles of dietary content, such as calories, fat, sodium, cholesterol and nutrients.;Long Term Goal: Adherence to prescribed nutrition plan.       Nutrition Assessments: Nutrition Assessments - 01/18/19 1547      Rate Your Plate Scores   Pre Score  39       Nutrition Goals Re-Evaluation: Nutrition Goals Re-Evaluation    [place] Name 01/18/19 1544             Goals    Current Weight  143 lb (64.9 kg)          Nutrition Goals Discharge (Final Nutrition Goals Re-Evaluation): Nutrition Goals Re-Evaluation - 01/18/19 1544      Goals   Current Weight  143 lb (64.9 kg)  Psychosocial: Target Goals: Acknowledge presence or absence of significant depression and/or stress, maximize coping skills, provide positive support system. Participant is able to verbalize types and ability to use techniques and skills needed for reducing stress and depression.  Initial Review & Psychosocial Screening: Initial Psych Review & Screening - 01/16/19 1119      Initial Review   Current issues with  None Identified      Family Dynamics   Good Support System?  Yes    Comments  son       Barriers   Psychosocial barriers to participate in program  The patient should benefit from training in stress management and relaxation.;There are no identifiable barriers or psychosocial needs.      Screening Interventions   Expected Outcomes  Long Term goal: The participant improves quality of Life and PHQ9 Scores as seen by post scores and/or verbalization of changes;Short Term goal: Identification and review with participant of any Quality of Life or Depression concerns found by scoring the questionnaire.       Quality of Life Scores:  Scores of 19 and below usually indicate a poorer quality of life in these areas.  A difference of  2-3 points is a clinically meaningful difference.  A difference of 2-3 points in the total score of the Quality of Life Index has been associated with significant improvement in overall quality of life, self-image, physical symptoms, and general health in studies assessing change in quality of life.  PHQ-9: Recent Review Flowsheet Data    Depression screen [facility] 2/9 01/16/2019   Decreased Interest 0   Down, Depressed, Hopeless 0   PHQ - 2 Score 0   Altered sleeping 1   Tired, decreased energy 1   Change in appetite 0   Feeling bad or failure  about yourself  0   Trouble concentrating 1   Moving slowly or fidgety/restless 0   Suicidal thoughts 0   PHQ-9 Score 3   Difficult doing work/chores Not difficult at all     Interpretation of Total Score  Total Score Depression Severity:  1-4 = Minimal depression, 5-9 = Mild depression, 10-14 = Moderate depression, 15-19 = Moderately severe depression, 20-27 = Severe depression   Psychosocial Evaluation and Intervention: Psychosocial Evaluation - 02/11/19 1558      Psychosocial Evaluation & Interventions   Interventions  Stress management education;Relaxation education;Encouraged to exercise with the program and follow exercise prescription    Comments  Pt has no identified barriers and seems to enjoy participation in rehab. She has even made arrangements to attend other sessions if she cannot attend her own for various doctors appointments and prior engagements to ensure she does not miss appointments.    Expected Outcomes  pt will continue to remain free of barriers and will continue to maintain a positive and healthy outlook.    Continue Psychosocial Services   Follow up required by staff       Psychosocial Re-Evaluation: Psychosocial Re-Evaluation    [place] Name 01/22/19 5993 02/11/19 1558 02/11/19 1601 02/11/19 1605       Psychosocial Re-Evaluation   Current issues with  -  None Identified  None Identified  -    Comments  Has not started the program yet.  -  Pt is off to a good start and remains free of barriers. She is active participant in rehab, taking notes with education sessions and makin arrangements to attend other class time if she is unable to attend hers for varrious prior  commitments.   Pt is off to a good start and remains free of barriers. She is active participant in rehab, taking notes with education sessions and makin arrangements to attend other class time if she is unable to attend hers for various prior commitments. Rehab is currently closed and will plan to  re-evaulate pt progress upon reopening.    Expected Outcomes  No barriers to participation in pulmonary rehab  -  pt will remain free of barriers and will maintain a positive outlook  -    Interventions  Encouraged to attend Pulmonary Rehabilitation for the exercise;Relaxation education;Stress management education  -  Encouraged to attend Pulmonary Rehabilitation for the exercise;Relaxation education;Stress management education  -    Continue Psychosocial Services   -  -  Follow up required by staff  -       Psychosocial Discharge (Final Psychosocial Re-Evaluation): Psychosocial Re-Evaluation - 02/11/19 1605      Psychosocial Re-Evaluation   Comments  Pt is off to a good start and remains free of barriers. She is active participant in rehab, taking notes with education sessions and makin arrangements to attend other class time if she is unable to attend hers for various prior commitments. Rehab is currently closed and will plan to re-evaulate pt progress upon reopening.       Education: Education Goals: Education classes will be provided on a weekly basis, covering required topics. Participant will state understanding/return demonstration of topics presented.  Learning Barriers/Preferences: Learning Barriers/Preferences - 01/16/19 1122      Learning Barriers/Preferences   Learning Barriers  Sight    Learning Preferences  Individual Instruction;Group Instruction;Audio;Skilled Demonstration;Verbal Instruction;Written Material       Education Topics: Risk Factor Reduction:  -Group instruction that is supported by a PowerPoint presentation. Instructor discusses the definition of a risk factor, different risk factors for pulmonary disease, and how the heart and lungs work together.     Nutrition for Pulmonary Patient:  -Group instruction provided by PowerPoint slides, verbal discussion, and written materials to support subject matter. The instructor gives an explanation and review of  healthy diet recommendations, which includes a discussion on weight management, recommendations for fruit and vegetable consumption, as well as protein, fluid, caffeine, fiber, sodium, sugar, and alcohol. Tips for eating when patients are short of breath are discussed.   Pursed Lip Breathing:  -Group instruction that is supported by demonstration and informational handouts. Instructor discusses the benefits of pursed lip and diaphragmatic breathing and detailed demonstration on how to preform both.     Oxygen Safety:  -Group instruction provided by PowerPoint, verbal discussion, and written material to support subject matter. There is an overview of "What is Oxygen" and "Why do we need it".  Instructor also reviews how to create a safe environment for oxygen use, the importance of using oxygen as prescribed, and the risks of noncompliance. There is a brief discussion on traveling with oxygen and resources the patient may utilize.   Oxygen Equipment:  -Group instruction provided by [facility] Staff utilizing handouts, written materials, and equipment demonstrations.   Signs and Symptoms:  -Group instruction provided by written material and verbal discussion to support subject matter. Warning signs and symptoms of infection, stroke, and heart attack are reviewed and when to call the physician/911 reinforced. Tips for preventing the spread of infection discussed.   Advanced Directives:  -Group instruction provided by verbal instruction and written material to support subject matter. Instructor reviews Advanced Directive laws and proper instruction for filling  out document.   Pulmonary Video:  -Group video education that reviews the importance of medication and oxygen compliance, exercise, good nutrition, pulmonary hygiene, and pursed lip and diaphragmatic breathing for the pulmonary patient.   Exercise for the Pulmonary Patient:  -Group instruction that is supported by a PowerPoint  presentation. Instructor discusses benefits of exercise, core components of exercise, frequency, duration, and intensity of an exercise routine, importance of utilizing pulse oximetry during exercise, safety while exercising, and options of places to exercise outside of rehab.     Pulmonary Medications:  -Verbally interactive group education provided by instructor with focus on inhaled medications and proper administration.   Anatomy and Physiology of the Respiratory System and Intimacy:  -Group instruction provided by PowerPoint, verbal discussion, and written material to support subject matter. Instructor reviews respiratory cycle and anatomical components of the respiratory system and their functions. Instructor also reviews differences in obstructive and restrictive respiratory diseases with examples of each. Intimacy, Sex, and Sexuality differences are reviewed with a discussion on how relationships can change when diagnosed with pulmonary disease. Common sexual concerns are reviewed.   MD DAY -A group question and answer session with a medical doctor that allows participants to ask questions that relate to their pulmonary disease state.   OTHER EDUCATION -Group or individual verbal, written, or video instructions that support the educational goals of the pulmonary rehab program.   Holiday Eating Survival Tips:  -Group instruction provided by PowerPoint slides, verbal discussion, and written materials to support subject matter. The instructor gives patients tips, tricks, and techniques to help them not only survive but enjoy the holidays despite the onslaught of food that accompanies the holidays.   Knowledge Questionnaire Score: Knowledge Questionnaire Score - 01/16/19 1241      Knowledge Questionnaire Score   Pre Score  17/18       Core Components/Risk Factors/Patient Goals at Admission: Personal Goals and Risk Factors at Admission - 01/16/19 1122      Core Components/Risk  Factors/Patient Goals on Admission    Weight Management  Yes    Intervention  Weight Management/Obesity: Establish reasonable short term and long term weight goals.;Weight Management: Provide education and appropriate resources to help participant work on and attain dietary goals.;Weight Management: Develop a combined nutrition and exercise program designed to reach desired caloric intake, while maintaining appropriate intake of nutrient and fiber, sodium and fats, and appropriate energy expenditure required for the weight goal.    Admit Weight  143 lb 8.3 oz (65.1 kg)    Goal Weight: Short Term  135 lb (61.2 kg)    Goal Weight: Long Term  128 lb (58.1 kg)    Expected Outcomes  Understanding of distribution of calorie intake throughout the day with the consumption of 4-5 meals/snacks;Understanding recommendations for meals to include 15-35% energy as protein, 25-35% energy from fat, 35-60% energy from carbohydrates, less than '200mg'$  of dietary cholesterol, 20-35 gm of total fiber daily;Weight Loss: Understanding of general recommendations for a balanced deficit meal plan, which promotes 1-2 lb weight loss per week and includes a negative energy balance of [phone] kcal/d;Short Term: Continue to assess and modify interventions until short term weight is achieved;Long Term: Adherence to nutrition and physical activity/exercise program aimed toward attainment of established weight goal    Improve shortness of breath with ADL's  Yes    Intervention  Provide education, individualized exercise plan and daily activity instruction to help decrease symptoms of SOB with activities of daily living.    Expected  Outcomes  Short Term: Improve cardiorespiratory fitness to achieve a reduction of symptoms when performing ADLs;Long Term: Be able to perform more ADLs without symptoms or delay the onset of symptoms    Hypertension  Yes    Intervention  Provide education on lifestyle modifcations including regular physical  activity/exercise, weight management, moderate sodium restriction and increased consumption of fresh fruit, vegetables, and low fat dairy, alcohol moderation, and smoking cessation.;Monitor prescription use compliance.    Expected Outcomes  Short Term: Continued assessment and intervention until BP is < 140/67m HG in hypertensive participants. < 130/818mHG in hypertensive participants with diabetes, heart failure or chronic kidney disease.;Long Term: Maintenance of blood pressure at goal levels.       Core Components/Risk Factors/Patient Goals Review:  Goals and Risk Factor Review    Row Name 01/22/19 1604 02/11/19 1602           Core Components/Risk Factors/Patient Goals Review   Personal Goals Review  Increase knowledge of respiratory medications and ability to use respiratory devices properly.;Improve shortness of breath with ADL's;Develop more efficient breathing techniques such as purse lipped breathing and diaphragmatic breathing and practicing self-pacing with activity.;Weight Management/Obesity  Increase knowledge of respiratory medications and ability to use respiratory devices properly.;Improve shortness of breath with ADL's;Develop more efficient breathing techniques such as purse lipped breathing and diaphragmatic breathing and practicing self-pacing with activity.;Weight Management/Obesity      Review  Has not started the program as of yet, but will in the next 2 days  pt is off to a great start with rehab. She has completed 3 exercise sessions and 2 education classes. Her workloads are off to a good start with a level 1 on the arm crank, level 2 on the nustep and completely 12 laps on the track. Rehab is currently closed and will plan to re-evaulate pt progress upon reopening.      Expected Outcomes  See admission goals  See admission goals and outcomes         Core Components/Risk Factors/Patient Goals at Discharge (Final Review):  Goals and Risk Factor Review - 02/11/19 1602       Core Components/Risk Factors/Patient Goals Review   Personal Goals Review  Increase knowledge of respiratory medications and ability to use respiratory devices properly.;Improve shortness of breath with ADL's;Develop more efficient breathing techniques such as purse lipped breathing and diaphragmatic breathing and practicing self-pacing with activity.;Weight Management/Obesity    Review  pt is off to a great start with rehab. She has completed 3 exercise sessions and 2 education classes. Her workloads are off to a good start with a level 1 on the arm crank, level 2 on the nustep and completely 12 laps on the track. Rehab is currently closed and will plan to re-evaulate pt progress upon reopening.    Expected Outcomes  See admission goals and outcomes       ITP Comments: ITP Comments    Row Name 01/16/19 1015 02/11/19 1557         ITP Comments  Dr. WeManfred ArchMedical Director for Pulmonary Rehab  Dr. WeManfred ArchMedical Director for Pulmonary Rehab         Comments: ITP REVIEW Pt is making expected progress toward personal goals after completing 3 sessions. Rehab is currently closed and will plan to re-evaulate pt progress upon reopening.  LiJoycelyn ManN, BSN Cardiac and Pulmonary Rehab RN

## 2019-02-12 ENCOUNTER — Telehealth (HOSPITAL_COMMUNITY): Payer: Self-pay

## 2019-02-12 ENCOUNTER — Encounter (HOSPITAL_COMMUNITY): Payer: Medicare Other

## 2019-02-14 ENCOUNTER — Encounter (HOSPITAL_COMMUNITY): Payer: Medicare Other

## 2019-02-19 ENCOUNTER — Encounter (HOSPITAL_COMMUNITY): Payer: Medicare Other

## 2019-02-21 ENCOUNTER — Encounter (HOSPITAL_COMMUNITY): Payer: Medicare Other

## 2019-02-26 ENCOUNTER — Encounter (HOSPITAL_COMMUNITY): Payer: Medicare Other

## 2019-02-28 ENCOUNTER — Encounter (HOSPITAL_COMMUNITY): Payer: Medicare Other

## 2019-03-05 ENCOUNTER — Encounter (HOSPITAL_COMMUNITY): Payer: Medicare Other

## 2019-03-05 ENCOUNTER — Telehealth (HOSPITAL_COMMUNITY): Payer: Self-pay | Admitting: *Deleted

## 2019-03-07 ENCOUNTER — Encounter (HOSPITAL_COMMUNITY): Payer: Medicare Other

## 2019-03-12 ENCOUNTER — Encounter (HOSPITAL_COMMUNITY): Payer: Medicare Other

## 2019-03-14 ENCOUNTER — Encounter (HOSPITAL_COMMUNITY): Payer: Medicare Other

## 2019-03-19 ENCOUNTER — Encounter (HOSPITAL_COMMUNITY): Payer: Medicare Other

## 2019-03-21 ENCOUNTER — Encounter (HOSPITAL_COMMUNITY): Payer: Medicare Other

## 2019-03-26 ENCOUNTER — Encounter (HOSPITAL_COMMUNITY): Payer: Medicare Other

## 2019-03-28 ENCOUNTER — Encounter (HOSPITAL_COMMUNITY): Payer: Medicare Other

## 2019-04-02 ENCOUNTER — Encounter (HOSPITAL_COMMUNITY): Payer: Medicare Other

## 2019-04-03 ENCOUNTER — Telehealth (HOSPITAL_COMMUNITY): Payer: Self-pay | Admitting: *Deleted

## 2019-04-04 ENCOUNTER — Encounter (HOSPITAL_COMMUNITY): Payer: Medicare Other

## 2019-04-09 ENCOUNTER — Encounter (HOSPITAL_COMMUNITY): Payer: Medicare Other

## 2019-04-11 ENCOUNTER — Encounter (HOSPITAL_COMMUNITY): Payer: Medicare Other

## 2019-04-16 ENCOUNTER — Encounter (HOSPITAL_COMMUNITY): Payer: Medicare Other

## 2019-04-18 ENCOUNTER — Encounter (HOSPITAL_COMMUNITY): Payer: Medicare Other

## 2019-04-23 ENCOUNTER — Encounter (HOSPITAL_COMMUNITY): Payer: Medicare Other

## 2019-04-25 ENCOUNTER — Encounter (HOSPITAL_COMMUNITY): Payer: Medicare Other

## 2019-04-30 ENCOUNTER — Encounter (HOSPITAL_COMMUNITY): Payer: Medicare Other

## 2019-06-07 ENCOUNTER — Telehealth (HOSPITAL_COMMUNITY): Payer: Self-pay

## 2019-06-10 ENCOUNTER — Telehealth (HOSPITAL_COMMUNITY): Payer: Self-pay | Admitting: General Practice

## 2019-06-10 ENCOUNTER — Telehealth (HOSPITAL_COMMUNITY): Payer: Self-pay

## 2019-06-17 ENCOUNTER — Telehealth (HOSPITAL_COMMUNITY): Payer: Self-pay | Admitting: General Practice

## 2019-06-18 ENCOUNTER — Encounter (HOSPITAL_COMMUNITY)
Admission: RE | Admit: 2019-06-18 | Discharge: 2019-06-18 | Disposition: A | Discharge status: Home / Self Care | Payer: Medicare Other | Source: Ambulatory Visit | Attending: Pulmonary Disease | Admitting: Pulmonary Disease

## 2019-06-18 ENCOUNTER — Ambulatory Visit (HOSPITAL_COMMUNITY): Payer: Medicare Other

## 2019-06-18 ENCOUNTER — Other Ambulatory Visit: Payer: Self-pay

## 2019-06-18 DIAGNOSIS — J984 Other disorders of lung: Secondary | ICD-10-CM

## 2019-06-18 NOTE — Progress Notes (Signed)
Daily Session Note  Patient Details  Name: Kathleen [name] MRN: [id number] Date of Birth: 07/13/1966 Referring Provider:     Pulmonary Rehab Walk Test from 01/17/2019 in [facility]  Referring Provider  Dr. Yacouv      Encounter Date: 06/18/2019  Check In: Session Check In - 06/18/19 1153      Check-In   Supervising physician immediately available to respond to emergencies  [facility] immediately available    Physician(s)  Dr. Hall    Location  [facility]    Staff Present  Joan Behrens, RN, BSN;Dalton Fletcher, MS, Exercise Physiologist;Lisa Hughes, RN    Virtual Visit  No    Medication changes reported      No    Fall or balance concerns reported     No    Tobacco Cessation  No Change    Warm-up and Cool-down  Performed as group-led instruction    Resistance Training Performed  Yes    VAD Patient?  No    PAD/SET Patient?  No      Pain Assessment   Currently in Pain?  No/denies    Multiple Pain Sites  No       Capillary Blood Glucose: No results found for this or any previous visit (from the past 24 hour(s)).  Exercise Prescription Changes - 06/18/19 1200      Response to Exercise   Blood Pressure (Admit)  130/78    Blood Pressure (Exercise)  150/86    Blood Pressure (Exit)  148/88    Heart Rate (Admit)  89 bpm    Heart Rate (Exercise)  112 bpm    Heart Rate (Exit)  90 bpm    Oxygen Saturation (Admit)  97 %    Oxygen Saturation (Exercise)  91 %    Oxygen Saturation (Exit)  94 %    Rating of Perceived Exertion (Exercise)  12    Perceived Dyspnea (Exercise)  2    Duration  Continue with 30 min of aerobic exercise without signs/symptoms of physical distress.    Intensity  THRR unchanged      Progression   Progression  Continue to progress workloads to maintain intensity without signs/symptoms of physical distress.      Resistance Training   Training Prescription  Yes    Weight  orange bands    Reps  10-15     Time  10 Minutes      Interval Training   Interval Training  No      Recumbant Bike   Level  1    Watts  10    Minutes  15      NuStep   Level  2    SPM  80    Minutes  15    METs  1.8       Social History   Tobacco Use  Smoking Status Never Smoker  Smokeless Tobacco Never Used    Goals Met:  Exercise tolerated well Strength training completed today  Goals Unmet:  Not Applicable  Comments: Service time is from 1015 to 1100    Dr. Wesam G. Yacoub is Medical Director for Pulmonary Rehab at [facility]. 

## 2019-06-20 ENCOUNTER — Other Ambulatory Visit: Payer: Self-pay

## 2019-06-20 ENCOUNTER — Encounter (HOSPITAL_COMMUNITY)
Admission: RE | Admit: 2019-06-20 | Discharge: 2019-06-20 | Disposition: A | Discharge status: Home / Self Care | Payer: Medicare Other | Source: Ambulatory Visit | Attending: Pulmonary Disease | Admitting: Pulmonary Disease

## 2019-06-20 DIAGNOSIS — J984 Other disorders of lung: Secondary | ICD-10-CM | POA: Diagnosis not present

## 2019-06-20 NOTE — Progress Notes (Signed)
Daily Session Note  Patient Details  Name: Kathleen [name] MRN: [id number] Date of Birth: 02-14-1966 Referring Provider:     Pulmonary Rehab Walk Test from 01/17/2019 in [place]  Referring Provider  Dr. Tanna Savoy      Encounter Date: 06/20/2019  Check In: Session Check In - 06/20/19 1015      Check-In   Supervising physician immediately available to respond to emergencies  [facility] immediately available    Physician(s)  Dr. [place] Crane    Location  [facility]    Staff Present  Rosebud Poles, RN, Bjorn Loser, MS, Exercise Physiologist;Tyreke Kaeser Ysidro Evert, RN    Resistance Training Performed  Yes    VAD Patient?  No    PAD/SET Patient?  No      Pain Assessment   Currently in Pain?  No/denies    Multiple Pain Sites  No       Capillary Blood Glucose: No results found for this or any previous visit (from the past 24 hour(s)).    Social History   Tobacco Use  Smoking Status Never Smoker  Smokeless Tobacco Never Used    Goals Met:  Exercise tolerated well No report of cardiac concerns or symptoms Strength training completed today  Goals Unmet:  Not Applicable  Comments: Service time is from 1000 to 1105    Dr. Rush Farmer is Medical Director for Pulmonary Rehab at [facility].

## 2019-06-25 ENCOUNTER — Other Ambulatory Visit: Payer: Self-pay

## 2019-06-25 ENCOUNTER — Encounter (HOSPITAL_COMMUNITY)
Admission: RE | Admit: 2019-06-25 | Discharge: 2019-06-25 | Disposition: A | Discharge status: Home / Self Care | Payer: Medicare Other | Source: Ambulatory Visit | Attending: Pulmonary Disease | Admitting: Pulmonary Disease

## 2019-06-25 DIAGNOSIS — J984 Other disorders of lung: Secondary | ICD-10-CM

## 2019-06-25 NOTE — Progress Notes (Signed)
Daily Session Note  Patient Details  Name: Chee Dimon MRN: [id number] Date of Birth: June 15, 1966 Referring Provider:     Pulmonary Rehab Walk Test from 01/17/2019 in [place]  Referring Provider  Dr. Tanna Savoy      Encounter Date: 06/25/2019  Check In: Session Check In - 06/25/19 1000      Check-In   Physician(s)  Dr. [place]    Location  [facility]    Staff Present  Rosebud Poles, RN, Bjorn Loser, MS, Exercise Physiologist;Lisa Ysidro Evert, RN    Warm-up and Cool-down  Performed as group-led Higher education careers adviser Performed  Yes    VAD Patient?  No    PAD/SET Patient?  No      Pain Assessment   Currently in Pain?  No/denies    Multiple Pain Sites  No       Capillary Blood Glucose: No results found for this or any previous visit (from the past 24 hour(s)).    Social History   Tobacco Use  Smoking Status Never Smoker  Smokeless Tobacco Never Used    Goals Met:  Proper associated with RPD/PD & O2 Sat Exercise tolerated well Strength training completed today  Goals Unmet:  Not Applicable  Comments: Service time is from 1000 to 1100    Dr. Rush Farmer is Medical Director for Pulmonary Rehab at [facility].

## 2019-06-27 ENCOUNTER — Telehealth (HOSPITAL_COMMUNITY): Payer: Self-pay | Admitting: *Deleted

## 2019-06-27 ENCOUNTER — Other Ambulatory Visit: Payer: Self-pay

## 2019-06-27 ENCOUNTER — Encounter (HOSPITAL_COMMUNITY)
Admission: RE | Admit: 2019-06-27 | Discharge: 2019-06-27 | Disposition: A | Discharge status: Home / Self Care | Payer: Medicare Other | Source: Ambulatory Visit | Attending: Pulmonary Disease | Admitting: Pulmonary Disease

## 2019-06-27 DIAGNOSIS — J984 Other disorders of lung: Secondary | ICD-10-CM | POA: Diagnosis not present

## 2019-06-27 NOTE — Telephone Encounter (Signed)
Returned call to patient, she cannot make the 10:00 exercise class, but will come to the 13:15 class today.

## 2019-06-27 NOTE — Progress Notes (Signed)
Daily Session Note  Patient Details  Name: Kathleen [name] MRN: [id number] Date of Birth: [birth year] Referring Provider:     Pulmonary Rehab Walk Test from 01/17/2019 in [place]  Referring Provider  Dr. Tanna Savoy      Encounter Date: 06/27/2019  Check In: Session Check In - 06/27/19 1310      Check-In   Supervising physician immediately available to respond to emergencies  [facility] immediately available    Physician(s)  Dr. Benny Lennert    Location  [facility]    Staff Present  Rosebud Poles, RN, Bjorn Loser, MS, Exercise Physiologist;Tayjon Halladay Ysidro Evert, RN    Warm-up and Cool-down  Performed as group-led Higher education careers adviser Performed  Yes    VAD Patient?  No    PAD/SET Patient?  No      Pain Assessment   Currently in Pain?  No/denies    Multiple Pain Sites  No       Capillary Blood Glucose: No results found for this or any previous visit (from the past 24 hour(s)).    Social History   Tobacco Use  Smoking Status Never Smoker  Smokeless Tobacco Never Used    Goals Met:  Exercise tolerated well No report of cardiac concerns or symptoms Strength training completed today  Goals Unmet:  Not Applicable  Comments: Service time is from 1307 to 1403    Dr. Rush Farmer is Medical Director for Pulmonary Rehab at [facility].

## 2019-07-02 ENCOUNTER — Encounter (HOSPITAL_COMMUNITY)
Admission: RE | Admit: 2019-07-02 | Discharge: 2019-07-02 | Disposition: A | Discharge status: Home / Self Care | Payer: Medicare Other | Source: Ambulatory Visit | Attending: Pulmonary Disease | Admitting: Pulmonary Disease

## 2019-07-02 ENCOUNTER — Other Ambulatory Visit: Payer: Self-pay

## 2019-07-02 VITALS — Wt 150.1 lb

## 2019-07-02 DIAGNOSIS — J984 Other disorders of lung: Secondary | ICD-10-CM

## 2019-07-02 NOTE — Progress Notes (Signed)
I have reviewed a Home Exercise Prescription with Kathleen [name] . Kathleen [name] is not currently exercising at home.  The patient was advised to walk 3 days a week for 30-45 minutes.  Kathleen [name] and I discussed how to progress their exercise prescription.  The patient stated that their goals were to increase her strength to pre operation levels.  The patient stated that they understand the exercise prescription.  We reviewed exercise guidelines, target heart rate during exercise, RPE Scale, weather conditions, NTG use, endpoints for exercise, warmup and cool down.  Patient is encouraged to come to me with any questions. I will continue to follow up with the patient to assist them with progression and safety.

## 2019-07-02 NOTE — Progress Notes (Signed)
Daily Session Note  Patient Details  Name: Kathleen [name] MRN: [id number] Date of Birth: 1966-05-30 Referring Provider:     Pulmonary Rehab Walk Test from 01/17/2019 in [place]  Referring Provider  Dr. Tanna Savoy      Encounter Date: 07/02/2019  Check In: Session Check In - 07/02/19 0957      Check-In   Supervising physician immediately available to respond to emergencies  [facility] immediately available    Physician(s)  Dr. Benny Lennert    Location  [facility]    Staff Present  Rosebud Poles, RN, Bjorn Loser, MS, Exercise Physiologist;Hektor Huston Ysidro Evert, RN    Virtual Visit  No    Medication changes reported      No    Fall or balance concerns reported     No    Tobacco Cessation  No Change    Warm-up and Cool-down  Performed as group-led instruction    Resistance Training Performed  Yes    VAD Patient?  No    PAD/SET Patient?  No      Pain Assessment   Currently in Pain?  No/denies    Multiple Pain Sites  No       Capillary Blood Glucose: No results found for this or any previous visit (from the past 24 hour(s)).  Exercise Prescription Changes - 07/02/19 1100      Response to Exercise   Blood Pressure (Admit)  144/80    Blood Pressure (Exercise)  162/72    Blood Pressure (Exit)  132/68    Heart Rate (Admit)  96 bpm    Heart Rate (Exercise)  110 bpm    Heart Rate (Exit)  96 bpm    Oxygen Saturation (Admit)  91 %    Oxygen Saturation (Exercise)  89 %    Oxygen Saturation (Exit)  92 %    Rating of Perceived Exertion (Exercise)  13    Perceived Dyspnea (Exercise)  1    Duration  Continue with 45 min of aerobic exercise without signs/symptoms of physical distress.    Intensity  THRR unchanged      Progression   Progression  Continue to progress workloads to maintain intensity without signs/symptoms of physical distress.      Resistance Training   Training Prescription  Yes    Weight  orange bands    Reps  10-15     Time  10 Minutes      Interval Training   Interval Training  No      Recumbant Bike   Level  1    Watts  10    Minutes  15      NuStep   Level  3    SPM  80    Minutes  15    METs  1.6       Social History   Tobacco Use  Smoking Status Never Smoker  Smokeless Tobacco Never Used    Goals Met:  Exercise tolerated well No report of cardiac concerns or symptoms Strength training completed today  Goals Unmet:  Not Applicable  Comments: Service time is from 0955 to 1055    Dr. Rush Farmer is Medical Director for Pulmonary Rehab at [facility].

## 2019-07-04 ENCOUNTER — Encounter (HOSPITAL_COMMUNITY)
Admission: RE | Admit: 2019-07-04 | Discharge: 2019-07-04 | Disposition: A | Discharge status: Home / Self Care | Payer: Medicare Other | Source: Ambulatory Visit | Attending: Pulmonary Disease | Admitting: Pulmonary Disease

## 2019-07-04 ENCOUNTER — Other Ambulatory Visit: Payer: Self-pay

## 2019-07-04 DIAGNOSIS — J984 Other disorders of lung: Secondary | ICD-10-CM | POA: Diagnosis not present

## 2019-07-04 NOTE — Progress Notes (Signed)
Daily Session Note  Patient Details  Name: Kathleen [name] MRN: [id number] Date of Birth: [birth year] Referring Provider:     Pulmonary Rehab Walk Test from 01/17/2019 in [place]  Referring Provider  Dr. Tanna Savoy      Encounter Date: 07/04/2019  Check In: Session Check In - 07/04/19 1005      Check-In   Supervising physician immediately available to respond to emergencies  [facility] immediately available    Physician(s)  Dr. Benny Lennert    Location  [facility]    Staff Present  Rosebud Poles, RN, Bjorn Loser, MS, Exercise Physiologist;Savera Donson Ysidro Evert, RN    Tobacco Cessation  No Change    Warm-up and Cool-down  Performed as group-led Higher education careers adviser Performed  Yes    VAD Patient?  No    PAD/SET Patient?  No      Pain Assessment   Currently in Pain?  No/denies    Multiple Pain Sites  No       Capillary Blood Glucose: No results found for this or any previous visit (from the past 24 hour(s)).    Social History   Tobacco Use  Smoking Status Never Smoker  Smokeless Tobacco Never Used    Goals Met:  Exercise tolerated well No report of cardiac concerns or symptoms Strength training completed today  Goals Unmet:  Not Applicable  Comments: Service time is from 0953 to 1055      Dr. Rush Farmer is Medical Director for Pulmonary Rehab at [facility].

## 2019-07-09 ENCOUNTER — Encounter (HOSPITAL_COMMUNITY)
Admission: RE | Admit: 2019-07-09 | Discharge: 2019-07-09 | Disposition: A | Discharge status: Home / Self Care | Payer: Medicare Other | Source: Ambulatory Visit | Attending: Pulmonary Disease | Admitting: Pulmonary Disease

## 2019-07-09 ENCOUNTER — Other Ambulatory Visit: Payer: Self-pay

## 2019-07-09 VITALS — Wt 149.3 lb

## 2019-07-09 DIAGNOSIS — J984 Other disorders of lung: Secondary | ICD-10-CM

## 2019-07-09 NOTE — Progress Notes (Signed)
Daily Session Note  Patient Details  Name: Aribelle Mccosh MRN: [id number] Date of Birth: 1966/10/05 Referring Provider:     Pulmonary Rehab Walk Test from 01/17/2019 in [place]  Referring Provider  Dr. Tanna Savoy      Encounter Date: 07/09/2019  Check In: Session Check In - 07/09/19 1000      Check-In   Supervising physician immediately available to respond to emergencies  [facility] immediately available    Physician(s)  Dr. [place]    Location  [facility]    Staff Present  Rosebud Poles, RN, Bjorn Loser, MS, Exercise Physiologist    Fall or balance concerns reported     No    Tobacco Cessation  No Change    Warm-up and Cool-down  Performed as group-led instruction    Resistance Training Performed  Yes    VAD Patient?  No    PAD/SET Patient?  No      Pain Assessment   Currently in Pain?  No/denies    Multiple Pain Sites  No       Capillary Blood Glucose: No results found for this or any previous visit (from the past 24 hour(s)).    Social History   Tobacco Use  Smoking Status Never Smoker  Smokeless Tobacco Never Used    Goals Met:  Proper associated with RPD/PD & O2 Sat Exercise tolerated well Strength training completed today  Goals Unmet:  Not Applicable  Comments: Service time is from 0945 to [place]    Dr. Rush Farmer is Medical Director for Pulmonary Rehab at [facility].

## 2019-07-11 ENCOUNTER — Encounter (HOSPITAL_COMMUNITY)
Admission: RE | Admit: 2019-07-11 | Discharge: 2019-07-11 | Disposition: A | Discharge status: Home / Self Care | Payer: Medicare Other | Source: Ambulatory Visit | Attending: Pulmonary Disease | Admitting: Pulmonary Disease

## 2019-07-11 ENCOUNTER — Other Ambulatory Visit: Payer: Self-pay

## 2019-07-11 VITALS — Wt 150.8 lb

## 2019-07-11 DIAGNOSIS — J984 Other disorders of lung: Secondary | ICD-10-CM

## 2019-07-11 NOTE — Progress Notes (Signed)
Daily Session Note  Patient Details  Name: Layton Tappan MRN: [id number] Date of Birth: 06-14-1966 Referring Provider:     Pulmonary Rehab Walk Test from 01/17/2019 in [place]  Referring Provider  Dr. Tanna Savoy      Encounter Date: 07/11/2019  Check In: Session Check In - 07/11/19 1012      Check-In   Supervising physician immediately available to respond to emergencies  [facility] immediately available    Physician(s)  Dr. Benny Lennert    Location  [facility]    Staff Present  Rosebud Poles, RN, Roque Cash, RN    Virtual Visit  No    Medication changes reported      No    Fall or balance concerns reported     No    Tobacco Cessation  No Change    Warm-up and Cool-down  Performed on first and last piece of equipment    Resistance Training Performed  Yes    VAD Patient?  No    PAD/SET Patient?  No      Pain Assessment   Currently in Pain?  No/denies    Multiple Pain Sites  No       Capillary Blood Glucose: No results found for this or any previous visit (from the past 24 hour(s)).    Social History   Tobacco Use  Smoking Status Never Smoker  Smokeless Tobacco Never Used    Goals Met:  Exercise tolerated well No report of cardiac concerns or symptoms Strength training completed today  Goals Unmet:  Not Applicable  Comments: Service time is from 1000 to 1100    Dr. Rush Farmer is Medical Director for Pulmonary Rehab at [facility].

## 2019-07-16 ENCOUNTER — Encounter (HOSPITAL_COMMUNITY)
Admission: RE | Admit: 2019-07-16 | Discharge: 2019-07-16 | Disposition: A | Discharge status: Home / Self Care | Payer: Medicare Other | Source: Ambulatory Visit | Attending: Pulmonary Disease | Admitting: Pulmonary Disease

## 2019-07-16 DIAGNOSIS — J984 Other disorders of lung: Secondary | ICD-10-CM

## 2019-07-16 NOTE — Progress Notes (Signed)
Pulmonary Individual Treatment Plan  Patient Details  Name: Kathleen [name] MRN: [id number] Date of Birth: July 23, 1966 Referring Provider:     Pulmonary Rehab Walk Test from 01/17/2019 in [place]  Referring Provider  Dr. Tanna Savoy      Initial Encounter Date:    Pulmonary Rehab Walk Test from 01/17/2019 in [place]  Date  01/17/19      Visit Diagnosis: Restrictive lung disease  Patient's Home Medications on Admission:   Current Outpatient Medications:  .  acetaminophen (TYLENOL) 500 MG tablet, Take 1,000 mg by mouth every 6 (six) hours as needed for moderate pain., Disp: , Rfl:  .  amitriptyline (ELAVIL) 25 MG tablet, Take 25 mg by mouth daily., Disp: , Rfl:  .  amoxicillin (AMOXIL) 500 MG capsule, Take 500 mg by mouth once as needed. Takes four capsules prior to dental procedures, Disp: , Rfl:  .  cetirizine (ZYRTEC) 10 MG tablet, Take 10 mg by mouth daily., Disp: , Rfl:  .  gabapentin (NEURONTIN) 100 MG capsule, Take 100 mg by mouth at bedtime. Take 3 capsules, Disp: , Rfl:  .  ipratropium (ATROVENT) 0.06 % nasal spray, Place 42 sprays into the nose 2 (two) times daily as needed., Disp: , Rfl:  .  labetalol (NORMODYNE) 100 MG tablet, Take 300 mg by mouth 2 (two) times daily. , Disp: , Rfl:  .  lactulose (CHRONULAC) 10 GM/15ML solution, Take 10 g by mouth 3 (three) times daily with meals., Disp: , Rfl:  .  Multiple Vitamin (MULTIVITAMIN WITH MINERALS) TABS, Take 1 tablet by mouth daily. Women vitamin, Disp: , Rfl:  .  Multiple Vitamins-Minerals (HAIR/SKIN/NAILS) TABS, Take 1 tablet by mouth daily., Disp: , Rfl:  .  ondansetron (ZOFRAN-ODT) 4 MG disintegrating tablet, Take 4 mg by mouth as needed., Disp: , Rfl:  .  predniSONE (DELTASONE) 5 MG tablet, Take 5 mg by mouth daily with breakfast., Disp: , Rfl:  .  RENVELA 0.8 g PACK packet, Take 0.8 g by mouth 3 (three) times daily with meals. Takes 4 packets with meals Takes 3  packets with snack, Disp: , Rfl:  .  vitamin B-12 (CYANOCOBALAMIN) 1000 MCG tablet, Take 1,000 mcg by mouth daily., Disp: , Rfl:   Past Medical History: Past Medical History:  Diagnosis Date  . Anemia    iron def,   . Chronic kidney disease   . Hypertension   . Rheumatoid arthritis of the hand     Tobacco Use: Social History   Tobacco Use  Smoking Status Never Smoker  Smokeless Tobacco Never Used    Labs: Recent Review Flowsheet Data    Labs for ITP Cardiac and Pulmonary Rehab Latest Ref Rng & Units 03/16/2009 05/11/2009 07/06/2009 08/03/2009 02/16/2011   Cholestrol 0 - 200 mg/dL 191 ATP III CLASSIFICATION: <200     mg/dL   Desirable 200-239  mg/dL   Borderline High >=240    mg/dL   High 182 ATP III CLASSIFICATION: <200     mg/dL   Desirable 200-239  mg/dL   Borderline High >=240    mg/dL   High 182 ATP III CLASSIFICATION: <200     mg/dL   Desirable 200-239  mg/dL   Borderline High >=240    mg/dL   High 174 ATP III CLASSIFICATION: <200     mg/dL   Desirable 200-239  mg/dL   Borderline High >=240    mg/dL   High -   LDLCALC 0 -  99 mg/dL 109 Total Cholesterol/HDL:CHD Risk Coronary Heart Disease Risk Table Men   Women 1/2 Average Risk   3.4   3.3 Average Risk       5.0   4.4 2 X Average Risk   9.6   7.1 3 X Average Risk  23.4   11.0 Use the calculated Patient Ratio above and the CHD Risk Table to determine the patient's CHD Risk. ATP III CLASSIFICATION (LDL): <100     mg/dL   Optimal 100-129  mg/dL   Near or Above Optimal 130-159  mg/dL   Borderline 160-189  mg/dL   High >190     mg/dL   Very High(H) 89 Total Cholesterol/HDL:CHD Risk Coronary Heart Disease Risk Table Men   Women 1/2 Average Risk   3.4   3.3 Average Risk       5.0   4.4 2 X Average Risk   9.6   7.1 3 X Average Risk  23.4   11.0 Use the calculated Patient Ratio above and the CHD Risk Table to determine the patient's CHD Risk. ATP III CLASSIFICATION (LDL): <100     mg/dL   Optimal 100-129   mg/dL   Near or Above Optimal 130-159  mg/dL   Borderline 160-189  mg/dL   High >190     mg/dL   Very High 104 Total Cholesterol/HDL:CHD Risk Coronary Heart Disease Risk Table Men   Women 1/2 Average Risk   3.4   3.3 Average Risk       5.0   4.4 2 X Average Risk   9.6   7.1 3 X Average Risk  23.4   11.0 Use the calculated Patient Ratio above and the CHD Risk Table to determine the patient's CHD Risk. ATP III CLASSIFICATION (LDL): <100     mg/dL   Optimal 100-129  mg/dL   Near or Above Optimal 130-159  mg/dL   Borderline 160-189  mg/dL   High >190     mg/dL   Very High(H) 93 Total Cholesterol/HDL:CHD Risk Coronary Heart Disease Risk Table Men   Women 1/2 Average Risk   3.4   3.3 Average Risk       5.0   4.4 2 X Average Risk   9.6   7.1 3 X Average Risk  23.4   11.0 Use the calculated Patient Ratio above and the CHD Risk Table to determine the patient's CHD Risk. ATP III CLASSIFICATION (LDL): <100     mg/dL   Optimal 100-129  mg/dL   Near or Above Optimal 130-159  mg/dL   Borderline 160-189  mg/dL   High >190     mg/dL   Very High -   HDL >39 mg/dL 57 48 57 57 -   Trlycerides <150 mg/dL 127 223(H) 106 122 -   Hemoglobin A1c 4.0 - 6.0 % 5.1 (NOTE) The ADA recommends the following therapeutic goal for glycemic control related to Hgb A1c measurement: Goal of therapy: <6.5 Hgb A1c  Reference: American Diabetes Association: Clinical Practice Recommendations 2010, Diabetes Care, 2010, 33: (Suppl 1). 5.3 (NOTE) The ADA recommends the following therapeutic goal for glycemic control related to Hgb A1c measurement: Goal of therapy: <6.5 Hgb A1c  Reference: American Diabetes Association: Clinical Practice Recommendations 2010, Diabetes Care, 2010, 33: (Suppl 1). - - 4.8      Capillary Blood Glucose: No results found for: GLUCAP   Pulmonary Assessment Scores: Pulmonary Assessment Scores    Row Name 01/18/19 1030  ADL UCSD   ADL Phase  Entry       mMRC Score    mMRC Score  1       UCSD: Self-administered rating of dyspnea associated with activities of daily living (ADLs) 6-point scale (0 = "not at all" to 5 = "maximal or unable to do because of breathlessness")  Scoring Scores range from 0 to 120.  Minimally important difference is 5 units  CAT: CAT can identify the health impairment of COPD patients and is better correlated with disease progression.  CAT has a scoring range of zero to 40. The CAT score is classified into four groups of low (less than 10), medium (10 - 20), high (21-30) and very high (31-40) based on the impact level of disease on health status. A CAT score over 10 suggests significant symptoms.  A worsening CAT score could be explained by an exacerbation, poor medication adherence, poor inhaler technique, or progression of COPD or comorbid conditions.  CAT MCID is 2 points  mMRC: mMRC (Modified Medical Research Council) Dyspnea Scale is used to assess the degree of baseline functional disability in patients of respiratory disease due to dyspnea. No minimal important difference is established. A decrease in score of 1 point or greater is considered a positive change.   Pulmonary Function Assessment:   Exercise Target Goals: Exercise Program Goal: Individual exercise prescription set using results from initial 6 min walk test and THRR while considering  patient's activity barriers and safety.   Exercise Prescription Goal: Initial exercise prescription builds to 30-45 minutes a day of aerobic activity, 2-3 days per week.  Home exercise guidelines will be given to patient during program as part of exercise prescription that the participant will acknowledge.  Activity Barriers & Risk Stratification:   6 Minute Walk: 6 Minute Walk    Row Name 01/17/19 1315         6 Minute Walk   Phase  Initial     Distance  1400 feet     Walk Time  6 minutes     # of Rest Breaks  0     MPH  2.65     METS  3.07     RPE  12      Perceived Dyspnea   2     Symptoms  No     Resting HR  103 bpm     Resting BP  148/84     Resting Oxygen Saturation   94 %     Exercise Oxygen Saturation  during 6 min walk  89 %     Max Ex. HR  135 bpm     Max Ex. BP  204/100     2 Minute Post BP  180/92 174/90       Interval HR   1 Minute HR  110     2 Minute HR  110     3 Minute HR  127     4 Minute HR  132     5 Minute HR  135     6 Minute HR  132     2 Minute Post HR  110     Interval Heart Rate?  Yes       Interval Oxygen   Interval Oxygen?  Yes     Baseline Oxygen Saturation %  94 %     1 Minute Oxygen Saturation %  89 %     1 Minute Liters of Oxygen  0 L  2 Minute Oxygen Saturation %  89 %     2 Minute Liters of Oxygen  0 L     3 Minute Oxygen Saturation %  90 %     3 Minute Liters of Oxygen  0 L     4 Minute Oxygen Saturation %  89 %     4 Minute Liters of Oxygen  0 L     5 Minute Oxygen Saturation %  89 %     5 Minute Liters of Oxygen  0 L     6 Minute Oxygen Saturation %  89 %     6 Minute Liters of Oxygen  0 L     2 Minute Post Oxygen Saturation %  91 %     2 Minute Post Liters of Oxygen  0 L        Oxygen Initial Assessment: Oxygen Initial Assessment - 01/18/19 1029      Initial 6 min Walk   Oxygen Used  None      Program Oxygen Prescription   Program Oxygen Prescription  None       Oxygen Re-Evaluation: Oxygen Re-Evaluation    Row Name 02/11/19 1002 07/15/19 0955           Program Oxygen Prescription   Program Oxygen Prescription  None  None        Home Oxygen   Home Oxygen Device  None  None      Sleep Oxygen Prescription  None  None      Home Exercise Oxygen Prescription  None  None      Home at Rest Exercise Oxygen Prescription  None  None        Goals/Expected Outcomes   Short Term Goals  To learn and exhibit compliance with exercise, home and travel O2 prescription;To learn and understand importance of monitoring SPO2 with pulse oximeter and demonstrate accurate use of the  pulse oximeter.;To learn and understand importance of maintaining oxygen saturations>88%;To learn and demonstrate proper pursed lip breathing techniques or other breathing techniques.;To learn and demonstrate proper use of respiratory medications  To learn and exhibit compliance with exercise, home and travel O2 prescription;To learn and understand importance of monitoring SPO2 with pulse oximeter and demonstrate accurate use of the pulse oximeter.;To learn and understand importance of maintaining oxygen saturations>88%;To learn and demonstrate proper pursed lip breathing techniques or other breathing techniques.;To learn and demonstrate proper use of respiratory medications      Long  Term Goals  Exhibits compliance with exercise, home and travel O2 prescription;Verbalizes importance of monitoring SPO2 with pulse oximeter and return demonstration;Maintenance of O2 saturations>88%;Exhibits proper breathing techniques, such as pursed lip breathing or other method taught during program session;Compliance with respiratory medication;Demonstrates proper use of MDI's  Compliance with respiratory medication;Exhibits proper breathing techniques, such as pursed lip breathing or other method taught during program session;Exhibits compliance with exercise, home and travel O2 prescription;Verbalizes importance of monitoring SPO2 with pulse oximeter and return demonstration;Maintenance of O2 saturations>88%      Goals/Expected Outcomes  compliance  compliance         Oxygen Discharge (Final Oxygen Re-Evaluation): Oxygen Re-Evaluation - 07/15/19 0955      Program Oxygen Prescription   Program Oxygen Prescription  None      Home Oxygen   Home Oxygen Device  None    Sleep Oxygen Prescription  None    Home Exercise Oxygen Prescription  None    Home at Rest Exercise Oxygen Prescription  None      Goals/Expected Outcomes   Short Term Goals  To learn and exhibit compliance with exercise, home and travel O2  prescription;To learn and understand importance of monitoring SPO2 with pulse oximeter and demonstrate accurate use of the pulse oximeter.;To learn and understand importance of maintaining oxygen saturations>88%;To learn and demonstrate proper pursed lip breathing techniques or other breathing techniques.;To learn and demonstrate proper use of respiratory medications    Long  Term Goals  Compliance with respiratory medication;Exhibits proper breathing techniques, such as pursed lip breathing or other method taught during program session;Exhibits compliance with exercise, home and travel O2 prescription;Verbalizes importance of monitoring SPO2 with pulse oximeter and return demonstration;Maintenance of O2 saturations>88%    Goals/Expected Outcomes  compliance       Initial Exercise Prescription: Initial Exercise Prescription - 01/18/19 1000      Date of Initial Exercise RX and Referring Provider   Date  01/17/19    Referring Provider  Dr. Tanna Savoy      Recumbant Bike   Level  1    Watts  10    Minutes  17      NuStep   Level  2    SPM  80    Minutes  17    METs  1.5      Track   Laps  10    Minutes  17      Prescription Details   Frequency (times per week)  2    Duration  Progress to 45 minutes of aerobic exercise without signs/symptoms of physical distress      Intensity   THRR 40-80% of Max Heartrate  68-135    Ratings of Perceived Exertion  11-13    Perceived Dyspnea  0-4      Progression   Progression  Continue to progress workloads to maintain intensity without signs/symptoms of physical distress.      Resistance Training   Training Prescription  Yes    Weight  orange bands    Reps  10-15       Perform Capillary Blood Glucose checks as needed.  Exercise Prescription Changes: Exercise Prescription Changes    Row Name 01/31/19 1126 06/18/19 1200 07/02/19 1100         Response to Exercise   Blood Pressure (Admit)  130/82  130/78  144/80     Blood Pressure  (Exercise)  146/78  150/86  162/72     Blood Pressure (Exit)  132/70  148/88  132/68     Heart Rate (Admit)  103 bpm  89 bpm  96 bpm     Heart Rate (Exercise)  124 bpm  112 bpm  110 bpm     Heart Rate (Exit)  104 bpm  90 bpm  96 bpm     Oxygen Saturation (Admit)  93 %  97 %  91 %     Oxygen Saturation (Exercise)  90 %  91 %  89 %     Oxygen Saturation (Exit)  98 %  94 %  92 %     Rating of Perceived Exertion (Exercise)  _0 Perceived Dyspnea (Exercise)  _1 Duration  Progress to 45 minutes of aerobic exercise without signs/symptoms of physical distress  Continue with 30 min of aerobic exercise without signs/symptoms of physical distress.  Continue with 45 min of aerobic exercise without signs/symptoms of physical distress.  Intensity  - 40-80% HRR  THRR unchanged  THRR unchanged       Progression   Progression  Continue to progress workloads to maintain intensity without signs/symptoms of physical distress.  Continue to progress workloads to maintain intensity without signs/symptoms of physical distress.  Continue to progress workloads to maintain intensity without signs/symptoms of physical distress.       Resistance Training   Training Prescription  Yes  Yes  Yes     Weight  orange bands  orange bands  orange bands     Reps  10-15  10-15  10-15     Time  10 Minutes  10 Minutes  10 Minutes       Interval Training   Interval Training  No  No  No       Bike   Level  1.5 sci-fit  -  -     Watts  10  -  -     Minutes  17  -  -       Recumbant Bike   Level  -  1  1     Watts  -  10  10     Minutes  -  15  15       NuStep   Level  _0 SPM  80  80  80     Minutes  _1 METs  1.8  1.8  1.6       Home Exercise Plan   Plans to continue exercise at  -  -  Home (comment)     Frequency  -  -  Add 3 additional days to program exercise sessions.     Initial Home Exercises Provided  -  -  07/02/19        Exercise Comments: Exercise Comments     Row Name 07/02/19 1144           Exercise Comments  home exercise reviewed          Exercise Goals and Review:   Exercise Goals Re-Evaluation : Exercise Goals Re-Evaluation    Row Name 01/22/19 0859 02/11/19 1002 07/15/19 0955         Exercise Goal Re-Evaluation   Exercise Goals Review  -  Increase Physical Activity;Increase Strength and Stamina;Able to understand and use Dyspnea scale;Able to understand and use rate of perceived exertion (RPE) scale;Knowledge and understanding of Target Heart Rate Range (THRR);Understanding of Exercise Prescription  Increase Physical Activity;Increase Strength and Stamina;Able to understand and use Dyspnea scale;Able to understand and use rate of perceived exertion (RPE) scale;Knowledge and understanding of Target Heart Rate Range (THRR);Understanding of Exercise Prescription     Comments  Patient's first day of rehab will be on 01/24/19. Will monitor and progress as able.  Pt has completed 3 exercise sessions. Pt was exercising at low MET levels. Our department is currently closed and will be for an unknown amount of time. Called pt on 3/16 and instructed pt on home exercise to be doing while we are on this layoff. Will follow up with pt and give updates after I recieve them this week.  Pt has attended 8 exercise sessions since we reopened. Pt exercises at low MET levels (~1.8), but it is not due to lack of effort. Pt maintains a positive attitude and always gives good effort. Pt is motivated to lose weight in order to get an  abdominal surgery that she has been wanting for awhile now. Will continue to monitor and progress as able.     Expected Outcomes  -  Through exercise at rehab and at home, the patient will decrease shortness of breath with daily activities and feel confident in carrying out an exercise regime at home.   Through exercise at rehab and at home, the patient will decrease shortness of breath with daily activities and feel confident in  carrying out an exercise regime at home.        Discharge Exercise Prescription (Final Exercise Prescription Changes): Exercise Prescription Changes - 07/02/19 1100      Response to Exercise   Blood Pressure (Admit)  144/80    Blood Pressure (Exercise)  162/72    Blood Pressure (Exit)  132/68    Heart Rate (Admit)  96 bpm    Heart Rate (Exercise)  110 bpm    Heart Rate (Exit)  96 bpm    Oxygen Saturation (Admit)  91 %    Oxygen Saturation (Exercise)  89 %    Oxygen Saturation (Exit)  92 %    Rating of Perceived Exertion (Exercise)  13    Perceived Dyspnea (Exercise)  1    Duration  Continue with 45 min of aerobic exercise without signs/symptoms of physical distress.    Intensity  THRR unchanged      Progression   Progression  Continue to progress workloads to maintain intensity without signs/symptoms of physical distress.      Resistance Training   Training Prescription  Yes    Weight  orange bands    Reps  10-15    Time  10 Minutes      Interval Training   Interval Training  No      Recumbant Bike   Level  1    Watts  10    Minutes  15      NuStep   Level  3    SPM  80    Minutes  15    METs  1.6      Home Exercise Plan   Plans to continue exercise at  Home (comment)    Frequency  Add 3 additional days to program exercise sessions.    Initial Home Exercises Provided  07/02/19       Nutrition:  Target Goals: Understanding of nutrition guidelines, daily intake of sodium <1565m, cholesterol <2021m calories 30% from fat and 7% or less from saturated fats, daily to have 5 or more servings of fruits and vegetables.  Biometrics:    Nutrition Therapy Plan and Nutrition Goals: Nutrition Therapy & Goals - 01/18/19 1544      Nutrition Therapy   Diet  renal       Personal Nutrition Goals   Nutrition Goal  The pt will continue to consume renal diet    Personal Goal #2  Identify food quantities necessary to achieve wt loss of  -2# per week to a goal wt loss of  6-24 lb at graduation from pulmonary rehab.      Intervention Plan   Intervention  Prescribe, educate and counsel regarding individualized specific dietary modifications aiming towards targeted core components such as weight, hypertension, lipid management, diabetes, heart failure and other comorbidities.    Expected Outcomes  Short Term Goal: Understand basic principles of dietary content, such as calories, fat, sodium, cholesterol and nutrients.;Long Term Goal: Adherence to prescribed nutrition plan.       Nutrition Assessments: Nutrition Assessments -  01/18/19 1547      Rate Your Plate Scores   Pre Score  39       Nutrition Goals Re-Evaluation: Nutrition Goals Re-Evaluation    Row Name 01/18/19 1544             Goals   Current Weight  143 lb (64.9 kg)          Nutrition Goals Discharge (Final Nutrition Goals Re-Evaluation): Nutrition Goals Re-Evaluation - 01/18/19 1544      Goals   Current Weight  143 lb (64.9 kg)       Psychosocial: Target Goals: Acknowledge presence or absence of significant depression and/or stress, maximize coping skills, provide positive support system. Participant is able to verbalize types and ability to use techniques and skills needed for reducing stress and depression.  Initial Review & Psychosocial Screening:   Quality of Life Scores:  Scores of 19 and below usually indicate a poorer quality of life in these areas.  A difference of  2-3 points is a clinically meaningful difference.  A difference of 2-3 points in the total score of the Quality of Life Index has been associated with significant improvement in overall quality of life, self-image, physical symptoms, and general health in studies assessing change in quality of life.  PHQ-9: Recent Review Flowsheet Data    Depression screen [facility] 2/9 01/16/2019   Decreased Interest 0   Down, Depressed, Hopeless 0   PHQ - 2 Score 0   Altered sleeping 1   Tired, decreased energy 1   Change in  appetite 0   Feeling bad or failure about yourself  0   Trouble concentrating 1   Moving slowly or fidgety/restless 0   Suicidal thoughts 0   PHQ-9 Score 3   Difficult doing work/chores Not difficult at all     Interpretation of Total Score  Total Score Depression Severity:  1-4 = Minimal depression, 5-9 = Mild depression, 10-14 = Moderate depression, 15-19 = Moderately severe depression, 20-27 = Severe depression   Psychosocial Evaluation and Intervention: Psychosocial Evaluation - 07/15/19 0934      Psychosocial Evaluation & Interventions   Interventions  Encouraged to exercise with the program and follow exercise prescription    Continue Psychosocial Services   No Follow up required       Psychosocial Re-Evaluation: Psychosocial Re-Evaluation    [place] Name 01/22/19 1555 02/11/19 1558 02/11/19 1601 02/11/19 1605 07/15/19 0939     Psychosocial Re-Evaluation   Current issues with  -  None Identified  None Identified  -  None Identified   Comments  Has not started the program yet.  -  Pt is off to a good start and remains free of barriers. She is active participant in rehab, taking notes with education sessions and makin arrangements to attend other class time if she is unable to attend hers for varrious prior commitments.   Pt is off to a good start and remains free of barriers. She is active participant in rehab, taking notes with education sessions and makin arrangements to attend other class time if she is unable to attend hers for various prior commitments. Rehab is currently closed and will plan to re-evaulate pt progress upon reopening.  No barriers to participation in pulmonary rehab   Expected Outcomes  No barriers to participation in pulmonary rehab  -  pt will remain free of barriers and will maintain a positive outlook  -  Will continue to have no barriers to participation in  pulmonary rehab.   Interventions  Encouraged to attend Pulmonary Rehabilitation for the  exercise;Relaxation education;Stress management education  -  Encouraged to attend Pulmonary Rehabilitation for the exercise;Relaxation education;Stress management education  -  Encouraged to attend Pulmonary Rehabilitation for the exercise   Continue Psychosocial Services   -  -  Follow up required by staff  -  No Follow up required      Psychosocial Discharge (Final Psychosocial Re-Evaluation): Psychosocial Re-Evaluation - 07/15/19 0939      Psychosocial Re-Evaluation   Current issues with  None Identified    Comments  No barriers to participation in pulmonary rehab    Expected Outcomes  Will continue to have no barriers to participation in pulmonary rehab.    Interventions  Encouraged to attend Pulmonary Rehabilitation for the exercise    Continue Psychosocial Services   No Follow up required       Education: Education Goals: Education classes will be provided on a weekly basis, covering required topics. Participant will state understanding/return demonstration of topics presented.  Learning Barriers/Preferences:   Education Topics: Risk Factor Reduction:  -Group instruction that is supported by a PowerPoint presentation. Instructor discusses the definition of a risk factor, different risk factors for pulmonary disease, and how the heart and lungs work together.     Nutrition for Pulmonary Patient:  -Group instruction provided by PowerPoint slides, verbal discussion, and written materials to support subject matter. The instructor gives an explanation and review of healthy diet recommendations, which includes a discussion on weight management, recommendations for fruit and vegetable consumption, as well as protein, fluid, caffeine, fiber, sodium, sugar, and alcohol. Tips for eating when patients are short of breath are discussed.   Pursed Lip Breathing:  -Group instruction that is supported by demonstration and informational handouts. Instructor discusses the benefits of pursed lip  and diaphragmatic breathing and detailed demonstration on how to preform both.     Oxygen Safety:  -Group instruction provided by PowerPoint, verbal discussion, and written material to support subject matter. There is an overview of "What is Oxygen" and "Why do we need it".  Instructor also reviews how to create a safe environment for oxygen use, the importance of using oxygen as prescribed, and the risks of noncompliance. There is a brief discussion on traveling with oxygen and resources the patient may utilize.   Oxygen Equipment:  -Group instruction provided by [facility] Staff utilizing handouts, written materials, and equipment demonstrations.   Signs and Symptoms:  -Group instruction provided by written material and verbal discussion to support subject matter. Warning signs and symptoms of infection, stroke, and heart attack are reviewed and when to call the physician/911 reinforced. Tips for preventing the spread of infection discussed.   Advanced Directives:  -Group instruction provided by verbal instruction and written material to support subject matter. Instructor reviews Advanced Directive laws and proper instruction for filling out document.   Pulmonary Video:  -Group video education that reviews the importance of medication and oxygen compliance, exercise, good nutrition, pulmonary hygiene, and pursed lip and diaphragmatic breathing for the pulmonary patient.   Exercise for the Pulmonary Patient:  -Group instruction that is supported by a PowerPoint presentation. Instructor discusses benefits of exercise, core components of exercise, frequency, duration, and intensity of an exercise routine, importance of utilizing pulse oximetry during exercise, safety while exercising, and options of places to exercise outside of rehab.     Pulmonary Medications:  -Verbally interactive group education provided by instructor with focus on inhaled medications  and proper  administration.   Anatomy and Physiology of the Respiratory System and Intimacy:  -Group instruction provided by PowerPoint, verbal discussion, and written material to support subject matter. Instructor reviews respiratory cycle and anatomical components of the respiratory system and their functions. Instructor also reviews differences in obstructive and restrictive respiratory diseases with examples of each. Intimacy, Sex, and Sexuality differences are reviewed with a discussion on how relationships can change when diagnosed with pulmonary disease. Common sexual concerns are reviewed.   MD DAY -A group question and answer session with a medical doctor that allows participants to ask questions that relate to their pulmonary disease state.   OTHER EDUCATION -Group or individual verbal, written, or video instructions that support the educational goals of the pulmonary rehab program.   Holiday Eating Survival Tips:  -Group instruction provided by PowerPoint slides, verbal discussion, and written materials to support subject matter. The instructor gives patients tips, tricks, and techniques to help them not only survive but enjoy the holidays despite the onslaught of food that accompanies the holidays.   Knowledge Questionnaire Score:   Core Components/Risk Factors/Patient Goals at Admission:   Core Components/Risk Factors/Patient Goals Review:  Goals and Risk Factor Review    Row Name 01/22/19 1604 02/11/19 1602 07/15/19 0944         Core Components/Risk Factors/Patient Goals Review   Personal Goals Review  Increase knowledge of respiratory medications and ability to use respiratory devices properly.;Improve shortness of breath with ADL's;Develop more efficient breathing techniques such as purse lipped breathing and diaphragmatic breathing and practicing self-pacing with activity.;Weight Management/Obesity  Increase knowledge of respiratory medications and ability to use respiratory  devices properly.;Improve shortness of breath with ADL's;Develop more efficient breathing techniques such as purse lipped breathing and diaphragmatic breathing and practicing self-pacing with activity.;Weight Management/Obesity  Increase knowledge of respiratory medications and ability to use respiratory devices properly.;Improve shortness of breath with ADL's;Develop more efficient breathing techniques such as purse lipped breathing and diaphragmatic breathing and practicing self-pacing with activity.     Review  Has not started the program as of yet, but will in the next 2 days  pt is off to a great start with rehab. She has completed 3 exercise sessions and 2 education classes. Her workloads are off to a good start with a level 1 on the arm crank, level 2 on the nustep and completely 12 laps on the track. Rehab is currently closed and will plan to re-evaulate pt progress upon reopening.  Patient has completed 11 exercise sessions with a 3 month gap due to department closure for COVID-19 precautions.  She is progressing well and on level 3 of the nustep, and level 1.5 of the recumbent bike     Expected Outcomes  See admission goals  See admission goals and outcomes  See admission goals        Core Components/Risk Factors/Patient Goals at Discharge (Final Review):  Goals and Risk Factor Review - 07/15/19 0944      Core Components/Risk Factors/Patient Goals Review   Personal Goals Review  Increase knowledge of respiratory medications and ability to use respiratory devices properly.;Improve shortness of breath with ADL's;Develop more efficient breathing techniques such as purse lipped breathing and diaphragmatic breathing and practicing self-pacing with activity.    Review  Patient has completed 11 exercise sessions with a 3 month gap due to department closure for COVID-19 precautions.  She is progressing well and on level 3 of the nustep, and level 1.5 of the recumbent bike  Expected Outcomes  See  admission goals       ITP Comments: ITP Comments    Row Name 02/11/19 1557           ITP Comments  Dr. Manfred Arch, Medical Director for Pulmonary Rehab          Comments: ITP REVIEW Pt is making expected progress toward pulmonary rehab goals after completing 11 sessions. Recommend continued exercise, life style modification, education, and utilization of breathing techniques to increase stamina and strength and decrease shortness of breath with exertion.

## 2019-07-16 NOTE — Progress Notes (Deleted)
Pulmonary Individual Treatment Plan  Patient Details  Name: Kathleen [name] MRN: [id number] Date of Birth: July 23, 1966 Referring Provider:     Pulmonary Rehab Walk Test from 01/17/2019 in [place]  Referring Provider  Dr. Tanna Savoy      Initial Encounter Date:    Pulmonary Rehab Walk Test from 01/17/2019 in [place]  Date  01/17/19      Visit Diagnosis: Restrictive lung disease  Patient's Home Medications on Admission:   Current Outpatient Medications:  .  acetaminophen (TYLENOL) 500 MG tablet, Take 1,000 mg by mouth every 6 (six) hours as needed for moderate pain., Disp: , Rfl:  .  amitriptyline (ELAVIL) 25 MG tablet, Take 25 mg by mouth daily., Disp: , Rfl:  .  amoxicillin (AMOXIL) 500 MG capsule, Take 500 mg by mouth once as needed. Takes four capsules prior to dental procedures, Disp: , Rfl:  .  cetirizine (ZYRTEC) 10 MG tablet, Take 10 mg by mouth daily., Disp: , Rfl:  .  gabapentin (NEURONTIN) 100 MG capsule, Take 100 mg by mouth at bedtime. Take 3 capsules, Disp: , Rfl:  .  ipratropium (ATROVENT) 0.06 % nasal spray, Place 42 sprays into the nose 2 (two) times daily as needed., Disp: , Rfl:  .  labetalol (NORMODYNE) 100 MG tablet, Take 300 mg by mouth 2 (two) times daily. , Disp: , Rfl:  .  lactulose (CHRONULAC) 10 GM/15ML solution, Take 10 g by mouth 3 (three) times daily with meals., Disp: , Rfl:  .  Multiple Vitamin (MULTIVITAMIN WITH MINERALS) TABS, Take 1 tablet by mouth daily. Women vitamin, Disp: , Rfl:  .  Multiple Vitamins-Minerals (HAIR/SKIN/NAILS) TABS, Take 1 tablet by mouth daily., Disp: , Rfl:  .  ondansetron (ZOFRAN-ODT) 4 MG disintegrating tablet, Take 4 mg by mouth as needed., Disp: , Rfl:  .  predniSONE (DELTASONE) 5 MG tablet, Take 5 mg by mouth daily with breakfast., Disp: , Rfl:  .  RENVELA 0.8 g PACK packet, Take 0.8 g by mouth 3 (three) times daily with meals. Takes 4 packets with meals Takes 3  packets with snack, Disp: , Rfl:  .  vitamin B-12 (CYANOCOBALAMIN) 1000 MCG tablet, Take 1,000 mcg by mouth daily., Disp: , Rfl:   Past Medical History: Past Medical History:  Diagnosis Date  . Anemia    iron def,   . Chronic kidney disease   . Hypertension   . Rheumatoid arthritis of the hand     Tobacco Use: Social History   Tobacco Use  Smoking Status Never Smoker  Smokeless Tobacco Never Used    Labs: Recent Review Flowsheet Data    Labs for ITP Cardiac and Pulmonary Rehab Latest Ref Rng & Units 03/16/2009 05/11/2009 07/06/2009 08/03/2009 02/16/2011   Cholestrol 0 - 200 mg/dL 191 ATP III CLASSIFICATION: <200     mg/dL   Desirable 200-239  mg/dL   Borderline High >=240    mg/dL   High 182 ATP III CLASSIFICATION: <200     mg/dL   Desirable 200-239  mg/dL   Borderline High >=240    mg/dL   High 182 ATP III CLASSIFICATION: <200     mg/dL   Desirable 200-239  mg/dL   Borderline High >=240    mg/dL   High 174 ATP III CLASSIFICATION: <200     mg/dL   Desirable 200-239  mg/dL   Borderline High >=240    mg/dL   High -   LDLCALC 0 -  99 mg/dL 109 Total Cholesterol/HDL:CHD Risk Coronary Heart Disease Risk Table Men   Women 1/2 Average Risk   3.4   3.3 Average Risk       5.0   4.4 2 X Average Risk   9.6   7.1 3 X Average Risk  23.4   11.0 Use the calculated Patient Ratio above and the CHD Risk Table to determine the patient's CHD Risk. ATP III CLASSIFICATION (LDL): <100     mg/dL   Optimal 100-129  mg/dL   Near or Above Optimal 130-159  mg/dL   Borderline 160-189  mg/dL   High >190     mg/dL   Very High(H) 89 Total Cholesterol/HDL:CHD Risk Coronary Heart Disease Risk Table Men   Women 1/2 Average Risk   3.4   3.3 Average Risk       5.0   4.4 2 X Average Risk   9.6   7.1 3 X Average Risk  23.4   11.0 Use the calculated Patient Ratio above and the CHD Risk Table to determine the patient's CHD Risk. ATP III CLASSIFICATION (LDL): <100     mg/dL   Optimal 100-129   mg/dL   Near or Above Optimal 130-159  mg/dL   Borderline 160-189  mg/dL   High >190     mg/dL   Very High 104 Total Cholesterol/HDL:CHD Risk Coronary Heart Disease Risk Table Men   Women 1/2 Average Risk   3.4   3.3 Average Risk       5.0   4.4 2 X Average Risk   9.6   7.1 3 X Average Risk  23.4   11.0 Use the calculated Patient Ratio above and the CHD Risk Table to determine the patient's CHD Risk. ATP III CLASSIFICATION (LDL): <100     mg/dL   Optimal 100-129  mg/dL   Near or Above Optimal 130-159  mg/dL   Borderline 160-189  mg/dL   High >190     mg/dL   Very High(H) 93 Total Cholesterol/HDL:CHD Risk Coronary Heart Disease Risk Table Men   Women 1/2 Average Risk   3.4   3.3 Average Risk       5.0   4.4 2 X Average Risk   9.6   7.1 3 X Average Risk  23.4   11.0 Use the calculated Patient Ratio above and the CHD Risk Table to determine the patient's CHD Risk. ATP III CLASSIFICATION (LDL): <100     mg/dL   Optimal 100-129  mg/dL   Near or Above Optimal 130-159  mg/dL   Borderline 160-189  mg/dL   High >190     mg/dL   Very High -   HDL >39 mg/dL 57 48 57 57 -   Trlycerides <150 mg/dL 127 223(H) 106 122 -   Hemoglobin A1c 4.0 - 6.0 % 5.1 (NOTE) The ADA recommends the following therapeutic goal for glycemic control related to Hgb A1c measurement: Goal of therapy: <6.5 Hgb A1c  Reference: American Diabetes Association: Clinical Practice Recommendations 2010, Diabetes Care, 2010, 33: (Suppl 1). 5.3 (NOTE) The ADA recommends the following therapeutic goal for glycemic control related to Hgb A1c measurement: Goal of therapy: <6.5 Hgb A1c  Reference: American Diabetes Association: Clinical Practice Recommendations 2010, Diabetes Care, 2010, 33: (Suppl 1). - - 4.8      Capillary Blood Glucose: No results found for: GLUCAP   Pulmonary Assessment Scores: Pulmonary Assessment Scores    Row Name 01/18/19 1030  ADL UCSD   ADL Phase  Entry       mMRC Score    mMRC Score  1       UCSD: Self-administered rating of dyspnea associated with activities of daily living (ADLs) 6-point scale (0 = "not at all" to 5 = "maximal or unable to do because of breathlessness")  Scoring Scores range from 0 to 120.  Minimally important difference is 5 units  CAT: CAT can identify the health impairment of COPD patients and is better correlated with disease progression.  CAT has a scoring range of zero to 40. The CAT score is classified into four groups of low (less than 10), medium (10 - 20), high (21-30) and very high (31-40) based on the impact level of disease on health status. A CAT score over 10 suggests significant symptoms.  A worsening CAT score could be explained by an exacerbation, poor medication adherence, poor inhaler technique, or progression of COPD or comorbid conditions.  CAT MCID is 2 points  mMRC: mMRC (Modified Medical Research Council) Dyspnea Scale is used to assess the degree of baseline functional disability in patients of respiratory disease due to dyspnea. No minimal important difference is established. A decrease in score of 1 point or greater is considered a positive change.   Pulmonary Function Assessment:   Exercise Target Goals: Exercise Program Goal: Individual exercise prescription set using results from initial 6 min walk test and THRR while considering  patient's activity barriers and safety.   Exercise Prescription Goal: Initial exercise prescription builds to 30-45 minutes a day of aerobic activity, 2-3 days per week.  Home exercise guidelines will be given to patient during program as part of exercise prescription that the participant will acknowledge.  Activity Barriers & Risk Stratification:   6 Minute Walk: 6 Minute Walk    Row Name 01/17/19 1315         6 Minute Walk   Phase  Initial     Distance  1400 feet     Walk Time  6 minutes     # of Rest Breaks  0     MPH  2.65     METS  3.07     RPE  12      Perceived Dyspnea   2     Symptoms  No     Resting HR  103 bpm     Resting BP  148/84     Resting Oxygen Saturation   94 %     Exercise Oxygen Saturation  during 6 min walk  89 %     Max Ex. HR  135 bpm     Max Ex. BP  204/100     2 Minute Post BP  180/92 174/90       Interval HR   1 Minute HR  110     2 Minute HR  110     3 Minute HR  127     4 Minute HR  132     5 Minute HR  135     6 Minute HR  132     2 Minute Post HR  110     Interval Heart Rate?  Yes       Interval Oxygen   Interval Oxygen?  Yes     Baseline Oxygen Saturation %  94 %     1 Minute Oxygen Saturation %  89 %     1 Minute Liters of Oxygen  0 L  2 Minute Oxygen Saturation %  89 %     2 Minute Liters of Oxygen  0 L     3 Minute Oxygen Saturation %  90 %     3 Minute Liters of Oxygen  0 L     4 Minute Oxygen Saturation %  89 %     4 Minute Liters of Oxygen  0 L     5 Minute Oxygen Saturation %  89 %     5 Minute Liters of Oxygen  0 L     6 Minute Oxygen Saturation %  89 %     6 Minute Liters of Oxygen  0 L     2 Minute Post Oxygen Saturation %  91 %     2 Minute Post Liters of Oxygen  0 L        Oxygen Initial Assessment: Oxygen Initial Assessment - 01/18/19 1029      Initial 6 min Walk   Oxygen Used  None      Program Oxygen Prescription   Program Oxygen Prescription  None       Oxygen Re-Evaluation: Oxygen Re-Evaluation    Row Name 02/11/19 1002 07/15/19 0955           Program Oxygen Prescription   Program Oxygen Prescription  None  None        Home Oxygen   Home Oxygen Device  None  None      Sleep Oxygen Prescription  None  None      Home Exercise Oxygen Prescription  None  None      Home at Rest Exercise Oxygen Prescription  None  None        Goals/Expected Outcomes   Short Term Goals  To learn and exhibit compliance with exercise, home and travel O2 prescription;To learn and understand importance of monitoring SPO2 with pulse oximeter and demonstrate accurate use of the  pulse oximeter.;To learn and understand importance of maintaining oxygen saturations>88%;To learn and demonstrate proper pursed lip breathing techniques or other breathing techniques.;To learn and demonstrate proper use of respiratory medications  To learn and exhibit compliance with exercise, home and travel O2 prescription;To learn and understand importance of monitoring SPO2 with pulse oximeter and demonstrate accurate use of the pulse oximeter.;To learn and understand importance of maintaining oxygen saturations>88%;To learn and demonstrate proper pursed lip breathing techniques or other breathing techniques.;To learn and demonstrate proper use of respiratory medications      Long  Term Goals  Exhibits compliance with exercise, home and travel O2 prescription;Verbalizes importance of monitoring SPO2 with pulse oximeter and return demonstration;Maintenance of O2 saturations>88%;Exhibits proper breathing techniques, such as pursed lip breathing or other method taught during program session;Compliance with respiratory medication;Demonstrates proper use of MDI's  Compliance with respiratory medication;Exhibits proper breathing techniques, such as pursed lip breathing or other method taught during program session;Exhibits compliance with exercise, home and travel O2 prescription;Verbalizes importance of monitoring SPO2 with pulse oximeter and return demonstration;Maintenance of O2 saturations>88%      Goals/Expected Outcomes  compliance  compliance         Oxygen Discharge (Final Oxygen Re-Evaluation): Oxygen Re-Evaluation - 07/15/19 0955      Program Oxygen Prescription   Program Oxygen Prescription  None      Home Oxygen   Home Oxygen Device  None    Sleep Oxygen Prescription  None    Home Exercise Oxygen Prescription  None    Home at Rest Exercise Oxygen Prescription  None      Goals/Expected Outcomes   Short Term Goals  To learn and exhibit compliance with exercise, home and travel O2  prescription;To learn and understand importance of monitoring SPO2 with pulse oximeter and demonstrate accurate use of the pulse oximeter.;To learn and understand importance of maintaining oxygen saturations>88%;To learn and demonstrate proper pursed lip breathing techniques or other breathing techniques.;To learn and demonstrate proper use of respiratory medications    Long  Term Goals  Compliance with respiratory medication;Exhibits proper breathing techniques, such as pursed lip breathing or other method taught during program session;Exhibits compliance with exercise, home and travel O2 prescription;Verbalizes importance of monitoring SPO2 with pulse oximeter and return demonstration;Maintenance of O2 saturations>88%    Goals/Expected Outcomes  compliance       Initial Exercise Prescription: Initial Exercise Prescription - 01/18/19 1000      Date of Initial Exercise RX and Referring Provider   Date  01/17/19    Referring Provider  Dr. Tanna Savoy      Recumbant Bike   Level  1    Watts  10    Minutes  17      NuStep   Level  2    SPM  80    Minutes  17    METs  1.5      Track   Laps  10    Minutes  17      Prescription Details   Frequency (times per week)  2    Duration  Progress to 45 minutes of aerobic exercise without signs/symptoms of physical distress      Intensity   THRR 40-80% of Max Heartrate  68-135    Ratings of Perceived Exertion  11-13    Perceived Dyspnea  0-4      Progression   Progression  Continue to progress workloads to maintain intensity without signs/symptoms of physical distress.      Resistance Training   Training Prescription  Yes    Weight  orange bands    Reps  10-15       Perform Capillary Blood Glucose checks as needed.  Exercise Prescription Changes: Exercise Prescription Changes    Row Name 01/31/19 1126 06/18/19 1200 07/02/19 1100         Response to Exercise   Blood Pressure (Admit)  130/82  130/78  144/80     Blood Pressure  (Exercise)  146/78  150/86  162/72     Blood Pressure (Exit)  132/70  148/88  132/68     Heart Rate (Admit)  103 bpm  89 bpm  96 bpm     Heart Rate (Exercise)  124 bpm  112 bpm  110 bpm     Heart Rate (Exit)  104 bpm  90 bpm  96 bpm     Oxygen Saturation (Admit)  93 %  97 %  91 %     Oxygen Saturation (Exercise)  90 %  91 %  89 %     Oxygen Saturation (Exit)  98 %  94 %  92 %     Rating of Perceived Exertion (Exercise)  _0 Perceived Dyspnea (Exercise)  _1 Duration  Progress to 45 minutes of aerobic exercise without signs/symptoms of physical distress  Continue with 30 min of aerobic exercise without signs/symptoms of physical distress.  Continue with 45 min of aerobic exercise without signs/symptoms of physical distress.  Intensity  - 40-80% HRR  THRR unchanged  THRR unchanged       Progression   Progression  Continue to progress workloads to maintain intensity without signs/symptoms of physical distress.  Continue to progress workloads to maintain intensity without signs/symptoms of physical distress.  Continue to progress workloads to maintain intensity without signs/symptoms of physical distress.       Resistance Training   Training Prescription  Yes  Yes  Yes     Weight  orange bands  orange bands  orange bands     Reps  10-15  10-15  10-15     Time  10 Minutes  10 Minutes  10 Minutes       Interval Training   Interval Training  No  No  No       Bike   Level  1.5 sci-fit  -  -     Watts  10  -  -     Minutes  17  -  -       Recumbant Bike   Level  -  1  1     Watts  -  10  10     Minutes  -  15  15       NuStep   Level  _0 SPM  80  80  80     Minutes  _1 METs  1.8  1.8  1.6       Home Exercise Plan   Plans to continue exercise at  -  -  Home (comment)     Frequency  -  -  Add 3 additional days to program exercise sessions.     Initial Home Exercises Provided  -  -  07/02/19        Exercise Comments: Exercise Comments     Row Name 07/02/19 1144           Exercise Comments  home exercise reviewed          Exercise Goals and Review:   Exercise Goals Re-Evaluation : Exercise Goals Re-Evaluation    Row Name 01/22/19 0859 02/11/19 1002 07/15/19 0955         Exercise Goal Re-Evaluation   Exercise Goals Review  -  Increase Physical Activity;Increase Strength and Stamina;Able to understand and use Dyspnea scale;Able to understand and use rate of perceived exertion (RPE) scale;Knowledge and understanding of Target Heart Rate Range (THRR);Understanding of Exercise Prescription  Increase Physical Activity;Increase Strength and Stamina;Able to understand and use Dyspnea scale;Able to understand and use rate of perceived exertion (RPE) scale;Knowledge and understanding of Target Heart Rate Range (THRR);Understanding of Exercise Prescription     Comments  Patient's first day of rehab will be on 01/24/19. Will monitor and progress as able.  Pt has completed 3 exercise sessions. Pt was exercising at low MET levels. Our department is currently closed and will be for an unknown amount of time. Called pt on 3/16 and instructed pt on home exercise to be doing while we are on this layoff. Will follow up with pt and give updates after I recieve them this week.  Pt has attended 8 exercise sessions since we reopened. Pt exercises at low MET levels (~1.8), but it is not due to lack of effort. Pt maintains a positive attitude and always gives good effort. Pt is motivated to lose weight in order to get an  abdominal surgery that she has been wanting for awhile now. Will continue to monitor and progress as able.     Expected Outcomes  -  Through exercise at rehab and at home, the patient will decrease shortness of breath with daily activities and feel confident in carrying out an exercise regime at home.   Through exercise at rehab and at home, the patient will decrease shortness of breath with daily activities and feel confident in  carrying out an exercise regime at home.        Discharge Exercise Prescription (Final Exercise Prescription Changes): Exercise Prescription Changes - 07/02/19 1100      Response to Exercise   Blood Pressure (Admit)  144/80    Blood Pressure (Exercise)  162/72    Blood Pressure (Exit)  132/68    Heart Rate (Admit)  96 bpm    Heart Rate (Exercise)  110 bpm    Heart Rate (Exit)  96 bpm    Oxygen Saturation (Admit)  91 %    Oxygen Saturation (Exercise)  89 %    Oxygen Saturation (Exit)  92 %    Rating of Perceived Exertion (Exercise)  13    Perceived Dyspnea (Exercise)  1    Duration  Continue with 45 min of aerobic exercise without signs/symptoms of physical distress.    Intensity  THRR unchanged      Progression   Progression  Continue to progress workloads to maintain intensity without signs/symptoms of physical distress.      Resistance Training   Training Prescription  Yes    Weight  orange bands    Reps  10-15    Time  10 Minutes      Interval Training   Interval Training  No      Recumbant Bike   Level  1    Watts  10    Minutes  15      NuStep   Level  3    SPM  80    Minutes  15    METs  1.6      Home Exercise Plan   Plans to continue exercise at  Home (comment)    Frequency  Add 3 additional days to program exercise sessions.    Initial Home Exercises Provided  07/02/19       Nutrition:  Target Goals: Understanding of nutrition guidelines, daily intake of sodium <1565m, cholesterol <2021m calories 30% from fat and 7% or less from saturated fats, daily to have 5 or more servings of fruits and vegetables.  Biometrics:    Nutrition Therapy Plan and Nutrition Goals: Nutrition Therapy & Goals - 01/18/19 1544      Nutrition Therapy   Diet  renal       Personal Nutrition Goals   Nutrition Goal  The pt will continue to consume renal diet    Personal Goal #2  Identify food quantities necessary to achieve wt loss of  -2# per week to a goal wt loss of  6-24 lb at graduation from pulmonary rehab.      Intervention Plan   Intervention  Prescribe, educate and counsel regarding individualized specific dietary modifications aiming towards targeted core components such as weight, hypertension, lipid management, diabetes, heart failure and other comorbidities.    Expected Outcomes  Short Term Goal: Understand basic principles of dietary content, such as calories, fat, sodium, cholesterol and nutrients.;Long Term Goal: Adherence to prescribed nutrition plan.       Nutrition Assessments: Nutrition Assessments -  01/18/19 1547      Rate Your Plate Scores   Pre Score  39       Nutrition Goals Re-Evaluation: Nutrition Goals Re-Evaluation    Row Name 01/18/19 1544             Goals   Current Weight  143 lb (64.9 kg)          Nutrition Goals Discharge (Final Nutrition Goals Re-Evaluation): Nutrition Goals Re-Evaluation - 01/18/19 1544      Goals   Current Weight  143 lb (64.9 kg)       Psychosocial: Target Goals: Acknowledge presence or absence of significant depression and/or stress, maximize coping skills, provide positive support system. Participant is able to verbalize types and ability to use techniques and skills needed for reducing stress and depression.  Initial Review & Psychosocial Screening:   Quality of Life Scores:  Scores of 19 and below usually indicate a poorer quality of life in these areas.  A difference of  2-3 points is a clinically meaningful difference.  A difference of 2-3 points in the total score of the Quality of Life Index has been associated with significant improvement in overall quality of life, self-image, physical symptoms, and general health in studies assessing change in quality of life.   PHQ-9: Recent Review Flowsheet Data    Depression screen [facility] 2/9 01/16/2019   Decreased Interest 0   Down, Depressed, Hopeless 0   PHQ - 2 Score 0   Altered sleeping 1   Tired, decreased energy 1   Change  in appetite 0   Feeling bad or failure about yourself  0   Trouble concentrating 1   Moving slowly or fidgety/restless 0   Suicidal thoughts 0   PHQ-9 Score 3   Difficult doing work/chores Not difficult at all     Interpretation of Total Score  Total Score Depression Severity:  1-4 = Minimal depression, 5-9 = Mild depression, 10-14 = Moderate depression, 15-19 = Moderately severe depression, 20-27 = Severe depression   Psychosocial Evaluation and Intervention: Psychosocial Evaluation - 07/15/19 0934      Psychosocial Evaluation & Interventions   Interventions  Encouraged to exercise with the program and follow exercise prescription    Continue Psychosocial Services   No Follow up required       Psychosocial Re-Evaluation: Psychosocial Re-Evaluation    [place] Name 01/22/19 1555 02/11/19 1558 02/11/19 1601 02/11/19 1605 07/15/19 0939     Psychosocial Re-Evaluation   Current issues with  -  None Identified  None Identified  -  None Identified   Comments  Has not started the program yet.  -  Pt is off to a good start and remains free of barriers. She is active participant in rehab, taking notes with education sessions and makin arrangements to attend other class time if she is unable to attend hers for varrious prior commitments.   Pt is off to a good start and remains free of barriers. She is active participant in rehab, taking notes with education sessions and makin arrangements to attend other class time if she is unable to attend hers for various prior commitments. Rehab is currently closed and will plan to re-evaulate pt progress upon reopening.  No barriers to participation in pulmonary rehab   Expected Outcomes  No barriers to participation in pulmonary rehab  -  pt will remain free of barriers and will maintain a positive outlook  -  Will continue to have no barriers to participation  in pulmonary rehab.   Interventions  Encouraged to attend Pulmonary Rehabilitation for the  exercise;Relaxation education;Stress management education  -  Encouraged to attend Pulmonary Rehabilitation for the exercise;Relaxation education;Stress management education  -  Encouraged to attend Pulmonary Rehabilitation for the exercise   Continue Psychosocial Services   -  -  Follow up required by staff  -  No Follow up required      Psychosocial Discharge (Final Psychosocial Re-Evaluation): Psychosocial Re-Evaluation - 07/15/19 0939      Psychosocial Re-Evaluation   Current issues with  None Identified    Comments  No barriers to participation in pulmonary rehab    Expected Outcomes  Will continue to have no barriers to participation in pulmonary rehab.    Interventions  Encouraged to attend Pulmonary Rehabilitation for the exercise    Continue Psychosocial Services   No Follow up required        Education: Education Goals: Education classes will be provided on a weekly basis, covering required topics. Participant will state understanding/return demonstration of topics presented.  Learning Barriers/Preferences:   Education Topics: How Lungs Work and Diseases: - Discuss the anatomy of the lungs and diseases that can affect the lungs, such as COPD.   Exercise: -Discuss the importance of exercise, FITT principles of exercise, normal and abnormal responses to exercise, and how to exercise safely.   Environmental Irritants: -Discuss types of environmental irritants and how to limit exposure to environmental irritants.   Meds/Inhalers and oxygen: - Discuss respiratory medications, definition of an inhaler and oxygen, and the proper way to use an inhaler and oxygen.   Energy Saving Techniques: - Discuss methods to conserve energy and decrease shortness of breath when performing activities of daily living.    Bronchial Hygiene / Breathing Techniques: - Discuss breathing mechanics, pursed-lip breathing technique,  proper posture, effective ways to clear airways, and other  functional breathing techniques   Cleaning Equipment: - Provides group verbal and written instruction about the health risks of elevated stress, cause of high stress, and healthy ways to reduce stress.   Nutrition I: Fats: - Discuss the types of cholesterol, what cholesterol does to the body, and how cholesterol levels can be controlled.   Nutrition II: Labels: -Discuss the different components of food labels and how to read food labels.   Respiratory Infections: - Discuss the signs and symptoms of respiratory infections, ways to prevent respiratory infections, and the importance of seeking medical treatment when having a respiratory infection.   Stress I: Signs and Symptoms: - Discuss the causes of stress, how stress may lead to anxiety and depression, and ways to limit stress.   Stress II: Relaxation: -Discuss relaxation techniques to limit stress.   Oxygen for Home/Travel: - Discuss how to prepare for travel when on oxygen and proper ways to transport and store oxygen to ensure safety.   Knowledge Questionnaire Score:   Core Components/Risk Factors/Patient Goals at Admission:   Core Components/Risk Factors/Patient Goals Review:  Goals and Risk Factor Review    Row Name 01/22/19 1604 02/11/19 1602 07/15/19 0944         Core Components/Risk Factors/Patient Goals Review   Personal Goals Review  Increase knowledge of respiratory medications and ability to use respiratory devices properly.;Improve shortness of breath with ADL's;Develop more efficient breathing techniques such as purse lipped breathing and diaphragmatic breathing and practicing self-pacing with activity.;Weight Management/Obesity  Increase knowledge of respiratory medications and ability to use respiratory devices properly.;Improve shortness of breath with ADL's;Develop more efficient  breathing techniques such as purse lipped breathing and diaphragmatic breathing and practicing self-pacing with  activity.;Weight Management/Obesity  Increase knowledge of respiratory medications and ability to use respiratory devices properly.;Improve shortness of breath with ADL's;Develop more efficient breathing techniques such as purse lipped breathing and diaphragmatic breathing and practicing self-pacing with activity.     Review  Has not started the program as of yet, but will in the next 2 days  pt is off to a great start with rehab. She has completed 3 exercise sessions and 2 education classes. Her workloads are off to a good start with a level 1 on the arm crank, level 2 on the nustep and completely 12 laps on the track. Rehab is currently closed and will plan to re-evaulate pt progress upon reopening.  Patient has completed 11 exercise sessions with a 3 month gap due to department closure for COVID-19 precautions.  She is progressing well and on level 3 of the nustep, and level 1.5 of the recumbent bike     Expected Outcomes  See admission goals  See admission goals and outcomes  See admission goals        Core Components/Risk Factors/Patient Goals at Discharge (Final Review):  Goals and Risk Factor Review - 07/15/19 0944      Core Components/Risk Factors/Patient Goals Review   Personal Goals Review  Increase knowledge of respiratory medications and ability to use respiratory devices properly.;Improve shortness of breath with ADL's;Develop more efficient breathing techniques such as purse lipped breathing and diaphragmatic breathing and practicing self-pacing with activity.    Review  Patient has completed 11 exercise sessions with a 3 month gap due to department closure for COVID-19 precautions.  She is progressing well and on level 3 of the nustep, and level 1.5 of the recumbent bike    Expected Outcomes  See admission goals       ITP Comments: ITP Comments    [place] Name 02/11/19 1557           ITP Comments  Dr. Manfred Arch, Medical Director for Pulmonary Rehab          Comments: ITP  REVIEW Pt is making expected progress toward pulmonary rehab goals after completing 11 sessions. Recommend continued exercise, life style modification, education, and utilization of breathing techniques to increase stamina and strength and decrease shortness of breath with exertion.

## 2019-07-18 ENCOUNTER — Encounter (HOSPITAL_COMMUNITY)
Admission: RE | Admit: 2019-07-18 | Discharge: 2019-07-18 | Disposition: A | Discharge status: Home / Self Care | Payer: Medicare Other | Source: Ambulatory Visit | Attending: Pulmonary Disease | Admitting: Pulmonary Disease

## 2019-07-18 ENCOUNTER — Other Ambulatory Visit: Payer: Self-pay

## 2019-07-18 DIAGNOSIS — J984 Other disorders of lung: Secondary | ICD-10-CM | POA: Diagnosis not present

## 2019-07-18 NOTE — Progress Notes (Signed)
Daily Session Note  Patient Details  Name: Brendia Dampier MRN: [id number] Date of Birth: Jul 11, 1966 Referring Provider:     Pulmonary Rehab Walk Test from 01/17/2019 in [place]  Referring Provider  Dr. Tanna Savoy      Encounter Date: 07/18/2019  Check In: Session Check In - 07/18/19 1016      Check-In   Supervising physician immediately available to respond to emergencies  [facility] immediately available    Physician(s)  Dr. Benny Lennert    Location  [facility]    Staff Present  Rosebud Poles, RN, Bjorn Loser, MS, Exercise Physiologist;Cobe Viney Ysidro Evert, RN    Virtual Visit  No    Medication changes reported      No    Fall or balance concerns reported     No    Tobacco Cessation  No Change    Warm-up and Cool-down  Performed as group-led instruction    Resistance Training Performed  Yes    VAD Patient?  No    PAD/SET Patient?  No      Pain Assessment   Currently in Pain?  No/denies    Multiple Pain Sites  No       Capillary Blood Glucose: No results found for this or any previous visit (from the past 24 hour(s)).    Social History   Tobacco Use  Smoking Status Never Smoker  Smokeless Tobacco Never Used    Goals Met:  Exercise tolerated well No report of cardiac concerns or symptoms Strength training completed today  Goals Unmet:  Not Applicable  Comments: Service time is from 1050 to 1110    Dr. Rush Farmer is Medical Director for Pulmonary Rehab at [facility].

## 2019-07-23 ENCOUNTER — Other Ambulatory Visit: Payer: Self-pay

## 2019-07-23 ENCOUNTER — Encounter (HOSPITAL_COMMUNITY)
Admission: RE | Admit: 2019-07-23 | Discharge: 2019-07-23 | Disposition: A | Discharge status: Home / Self Care | Payer: Medicare Other | Source: Ambulatory Visit | Attending: Pulmonary Disease | Admitting: Pulmonary Disease

## 2019-07-23 DIAGNOSIS — J984 Other disorders of lung: Secondary | ICD-10-CM | POA: Diagnosis present

## 2019-07-23 NOTE — Progress Notes (Signed)
Daily Session Note  Patient Details  Name: Kathleen [name] MRN: [id number] Date of Birth: 11/08/1966 Referring Provider:     Pulmonary Rehab Walk Test from 01/17/2019 in [place]  Referring Provider  Dr. Tanna Savoy      Encounter Date: 07/23/2019  Check In: Session Check In - 07/23/19 1000      Check-In   Supervising physician immediately available to respond to emergencies  [facility] immediately available    Physician(s)  Dr. Benny Lennert    Location  [facility]    Staff Present  Maurice Small, RN, Bjorn Loser, MS, Exercise Physiologist;Joan Leonia Reeves, RN, BSN    Warm-up and Cool-down  Performed as group-led Higher education careers adviser Performed  Yes    VAD Patient?  No    PAD/SET Patient?  No      Pain Assessment   Currently in Pain?  No/denies    Multiple Pain Sites  No       Capillary Blood Glucose: No results found for this or any previous visit (from the past 24 hour(s)).    Social History   Tobacco Use  Smoking Status Never Smoker  Smokeless Tobacco Never Used    Goals Met:  Exercise tolerated well  Goals Unmet:  Not Applicable  Comments: Service time is from 0958 to 1100    Dr. Rush Farmer is Medical Director for Pulmonary Rehab at [facility].

## 2019-07-25 ENCOUNTER — Other Ambulatory Visit: Payer: Self-pay

## 2019-07-25 ENCOUNTER — Encounter (HOSPITAL_COMMUNITY)
Admission: RE | Admit: 2019-07-25 | Discharge: 2019-07-25 | Disposition: A | Discharge status: Home / Self Care | Payer: Medicare Other | Source: Ambulatory Visit | Attending: Pulmonary Disease | Admitting: Pulmonary Disease

## 2019-07-25 DIAGNOSIS — J984 Other disorders of lung: Secondary | ICD-10-CM

## 2019-07-25 NOTE — Progress Notes (Signed)
Daily Session Note  Patient Details  Name: Kathleen [name] MRN: [id number] Date of Birth: 05-14-1966 Referring Provider:     Pulmonary Rehab Walk Test from 01/17/2019 in [place]  Referring Provider  Dr. Tanna Savoy      Encounter Date: 07/25/2019  Check In: Session Check In - 07/25/19 1002      Check-In   Supervising physician immediately available to respond to emergencies  [facility] immediately available    Physician(s)  Dr. Benny Lennert    Location  [facility]    Staff Present  Rosebud Poles, RN, Bjorn Loser, MS, Exercise Physiologist;Lisa Ysidro Evert, RN    Virtual Visit  No    Medication changes reported      No    Warm-up and Cool-down  Performed as group-led Higher education careers adviser Performed  Yes    VAD Patient?  No    PAD/SET Patient?  No      Pain Assessment   Currently in Pain?  No/denies    Multiple Pain Sites  No       Capillary Blood Glucose: No results found for this or any previous visit (from the past 24 hour(s)).    Social History   Tobacco Use  Smoking Status Never Smoker  Smokeless Tobacco Never Used    Goals Met:  Proper associated with RPD/PD & O2 Sat Exercise tolerated well Strength training completed today  Goals Unmet:  Not Applicable  Comments: Service time is from 0950 to [place]    Dr. Rush Farmer is Medical Director for Pulmonary Rehab at [facility].

## 2019-07-30 ENCOUNTER — Encounter (HOSPITAL_COMMUNITY)
Admission: RE | Admit: 2019-07-30 | Discharge: 2019-07-30 | Disposition: A | Discharge status: Home / Self Care | Payer: Medicare Other | Source: Ambulatory Visit | Attending: Pulmonary Disease | Admitting: Pulmonary Disease

## 2019-07-30 ENCOUNTER — Other Ambulatory Visit: Payer: Self-pay

## 2019-07-30 VITALS — Wt 151.9 lb

## 2019-07-30 DIAGNOSIS — J984 Other disorders of lung: Secondary | ICD-10-CM | POA: Diagnosis not present

## 2019-07-30 NOTE — Progress Notes (Signed)
Daily Session Note  Patient Details  Name: Kathleen [name] MRN: [id number] Date of Birth: 09/15/1966 Referring Provider:     Pulmonary Rehab Walk Test from 01/17/2019 in [facility]  Referring Provider  Dr. Yacouv      Encounter Date: 07/30/2019  Check In: Session Check In - 07/30/19 1036      Check-In   Supervising physician immediately available to respond to emergencies  [facility] immediately available    Physician(s)  Dr. Swayze    Location  [facility]    Staff Present  Joan Behrens, RN, BSN;Dalton Fletcher, MS, Exercise Physiologist;Carlette Carlton, RN, BSN    Virtual Visit  No    Medication changes reported      No    Fall or balance concerns reported     No    Tobacco Cessation  No Change    Warm-up and Cool-down  Performed as group-led instruction    Resistance Training Performed  Yes    VAD Patient?  No    PAD/SET Patient?  No      Pain Assessment   Currently in Pain?  No/denies    Multiple Pain Sites  No       Capillary Blood Glucose: No results found for this or any previous visit (from the past 24 hour(s)).  Exercise Prescription Changes - 07/30/19 1100      Response to Exercise   Blood Pressure (Admit)  158/96    Blood Pressure (Exercise)  164/92    Blood Pressure (Exit)  160/90    Heart Rate (Admit)  122 bpm    Heart Rate (Exercise)  98 bpm    Heart Rate (Exit)  93 bpm    Oxygen Saturation (Admit)  91 %    Oxygen Saturation (Exercise)  92 %    Oxygen Saturation (Exit)  96 %    Rating of Perceived Exertion (Exercise)  11    Perceived Dyspnea (Exercise)  1    Duration  Continue with 30 min of aerobic exercise without signs/symptoms of physical distress.    Intensity  THRR unchanged      Progression   Progression  Continue to progress workloads to maintain intensity without signs/symptoms of physical distress.      Resistance Training   Training Prescription  Yes    Weight  orange bands    Reps  10-15    Time  10 Minutes      Interval Training   Interval Training  No      Recumbant Bike   Level  1.5    Minutes  15      NuStep   Level  3    SPM  80    Minutes  15    METs  1.4       Social History   Tobacco Use  Smoking Status Never Smoker  Smokeless Tobacco Never Used    Goals Met:  Exercise tolerated well Personal goals reviewed  Goals Unmet:  Not Applicable  Comments: Service time is from 1000 to 1035    Dr. Wesam G. Yacoub is Medical Director for Pulmonary Rehab at [facility]. 

## 2019-07-30 NOTE — Progress Notes (Signed)
I have reviewed the pulmonary graduate packet with [name] . Kathleen [name] is scheduled to graduate on 08/01/2019. The patient was given an exercise prescription and stated that they were going to walk at home and potentially join pulmonary maintenance when it reopens.  Kathleen [name] and I discussed how to progress their exercise prescription.  The patient stated that they understand the exercise prescription.  We reviewed exercise guidelines, target heart rate during exercise, RPE Scale, weather conditions, NTG use, endpoints for exercise, warmup and cool down.  The patient was also given information regarding the pulmonary maintenance program and given homework to complete and return before graduation. Patient is encouraged to come to me with any questions.   Hoy Register, M.S., ACSM CEP

## 2019-08-01 ENCOUNTER — Encounter (HOSPITAL_COMMUNITY)
Admission: RE | Admit: 2019-08-01 | Discharge: 2019-08-01 | Disposition: A | Discharge status: Home / Self Care | Payer: Medicare Other | Source: Ambulatory Visit | Attending: Pulmonary Disease | Admitting: Pulmonary Disease

## 2019-08-01 ENCOUNTER — Other Ambulatory Visit: Payer: Self-pay

## 2019-08-01 DIAGNOSIS — J984 Other disorders of lung: Secondary | ICD-10-CM

## 2019-08-06 ENCOUNTER — Encounter (HOSPITAL_COMMUNITY): Payer: Medicare Other

## 2019-09-03 NOTE — Addendum Note (Signed)
Encounter addended by: Lance Morin, RN on: 09/03/2019 4:36 PM  Actions taken: Clinical Note Signed, Episode resolved

## 2019-09-03 NOTE — Progress Notes (Signed)
Discharge Progress Report  Patient Details  Name: Kathleen [name] MRN: [id number] Date of Birth: Apr 16, 1966 Referring Provider:     Pulmonary Rehab Walk Test from 01/17/2019 in [place]  Referring Provider  Dr. Tanna Savoy       Number of Visits: 16 Reason for Discharge:  Patient reached a stable level of exercise. Patient independent in their exercise. Patient has met program and personal goals.  Smoking History:  Social History   Tobacco Use  Smoking Status Never Smoker  Smokeless Tobacco Never Used    Diagnosis:  Restrictive lung disease  ADL UCSD: Pulmonary Assessment Scores    Row Name 08/01/19 1201         ADL UCSD   ADL Phase  Exit       mMRC Score   mMRC Score  1        Initial Exercise Prescription:   Discharge Exercise Prescription (Final Exercise Prescription Changes): Exercise Prescription Changes - 07/30/19 1100      Response to Exercise   Blood Pressure (Admit)  158/96    Blood Pressure (Exercise)  164/92    Blood Pressure (Exit)  160/90    Heart Rate (Admit)  122 bpm    Heart Rate (Exercise)  98 bpm    Heart Rate (Exit)  93 bpm    Oxygen Saturation (Admit)  91 %    Oxygen Saturation (Exercise)  92 %    Oxygen Saturation (Exit)  96 %    Rating of Perceived Exertion (Exercise)  11    Perceived Dyspnea (Exercise)  1    Duration  Continue with 30 min of aerobic exercise without signs/symptoms of physical distress.    Intensity  THRR unchanged      Progression   Progression  Continue to progress workloads to maintain intensity without signs/symptoms of physical distress.      Resistance Training   Training Prescription  Yes    Weight  orange bands    Reps  10-15    Time  10 Minutes      Interval Training   Interval Training  No      Recumbant Bike   Level  1.5    Minutes  15      NuStep   Level  3    SPM  80    Minutes  15    METs  1.4       Functional Capacity: 6 Minute Walk    Row Name  08/01/19 1201         6 Minute Walk   Phase  Discharge     Distance  881 feet     Distance Feet Change  -519 ft     Walk Time  5.58 minutes     # of Rest Breaks  1 Standing rest for 25 seconds     MPH  1.67     METS  2.3     RPE  13     Perceived Dyspnea   2     VO2 Peak  11.4     Symptoms  No     Resting HR  94 bpm     Resting BP  140/90     Resting Oxygen Saturation   92 %     Exercise Oxygen Saturation  during 6 min walk  91 %     Max Ex. HR  102 bpm     Max Ex. BP  142/86  2 Minute Post BP  132/90       Interval HR   1 Minute HR  95     2 Minute HR  95     3 Minute HR  100     4 Minute HR  99     5 Minute HR  102     6 Minute HR  99     2 Minute Post HR  91     Interval Heart Rate?  Yes       Interval Oxygen   Interval Oxygen?  Yes     Baseline Oxygen Saturation %  92 %     1 Minute Oxygen Saturation %  94 %     1 Minute Liters of Oxygen  0 L     2 Minute Oxygen Saturation %  92 %     2 Minute Liters of Oxygen  0 L     3 Minute Oxygen Saturation %  93 %     3 Minute Liters of Oxygen  0 L     4 Minute Oxygen Saturation %  91 %     4 Minute Liters of Oxygen  0 L     5 Minute Oxygen Saturation %  94 %     5 Minute Liters of Oxygen  0 L     6 Minute Oxygen Saturation %  95 %     6 Minute Liters of Oxygen  0 L     2 Minute Post Oxygen Saturation %  92 %     2 Minute Post Liters of Oxygen  0 L        Psychological, QOL, Others - Outcomes: PHQ 2/9: Depression screen PHQ 2/9 01/16/2019  Decreased Interest 0  Down, Depressed, Hopeless 0  PHQ - 2 Score 0  Altered sleeping 1  Tired, decreased energy 1  Change in appetite 0  Feeling bad or failure about yourself  0  Trouble concentrating 1  Moving slowly or fidgety/restless 0  Suicidal thoughts 0  PHQ-9 Score 3  Difficult doing work/chores Not difficult at all    Quality of Life:   Personal Goals: Goals established at orientation with interventions provided to work toward goal.    Personal Goals  Discharge: Goals and Risk Factor Review    Row Name 07/15/19 0944             Core Components/Risk Factors/Patient Goals Review   Personal Goals Review  Increase knowledge of respiratory medications and ability to use respiratory devices properly.;Improve shortness of breath with ADL's;Develop more efficient breathing techniques such as purse lipped breathing and diaphragmatic breathing and practicing self-pacing with activity.       Review  Patient has completed 11 exercise sessions with a 3 month gap due to department closure for COVID-19 precautions.  She is progressing well and on level 3 of the nustep, and level 1.5 of the recumbent bike       Expected Outcomes  See admission goals          Exercise Goals and Review:   Exercise Goals Re-Evaluation: Exercise Goals Re-Evaluation    [place] Name 07/15/19 0955             Exercise Goal Re-Evaluation   Exercise Goals Review  Increase Physical Activity;Increase Strength and Stamina;Able to understand and use Dyspnea scale;Able to understand and use rate of perceived exertion (RPE) scale;Knowledge and understanding of Target Heart Rate Range (THRR);Understanding of Exercise Prescription  Comments  Pt has attended 8 exercise sessions since we reopened. Pt exercises at low MET levels (~1.8), but it is not due to lack of effort. Pt maintains a positive attitude and always gives good effort. Pt is motivated to lose weight in order to get an abdominal surgery that she has been wanting for awhile now. Will continue to monitor and progress as able.       Expected Outcomes  Through exercise at rehab and at home, the patient will decrease shortness of breath with daily activities and feel confident in carrying out an exercise regime at home.          Nutrition & Weight - Outcomes:    Nutrition:   Nutrition Discharge:   Education Questionnaire Score:   Goals reviewed with patient; copy given to patient.

## 2020-01-02 ENCOUNTER — Other Ambulatory Visit: Payer: Self-pay | Admitting: *Deleted

## 2020-01-02 DIAGNOSIS — Z1231 Encounter for screening mammogram for malignant neoplasm of breast: Secondary | ICD-10-CM

## 2020-02-05 ENCOUNTER — Ambulatory Visit
Admission: RE | Admit: 2020-02-05 | Discharge: 2020-02-05 | Disposition: A | Discharge status: Home / Self Care | Payer: Medicare Other | Source: Ambulatory Visit | Attending: *Deleted | Admitting: *Deleted

## 2020-02-05 ENCOUNTER — Other Ambulatory Visit: Payer: Self-pay

## 2020-02-05 DIAGNOSIS — Z1231 Encounter for screening mammogram for malignant neoplasm of breast: Secondary | ICD-10-CM

## 2020-04-04 ENCOUNTER — Other Ambulatory Visit: Payer: Self-pay

## 2020-04-04 ENCOUNTER — Encounter (HOSPITAL_COMMUNITY): Payer: Self-pay | Admitting: *Deleted

## 2020-04-04 ENCOUNTER — Ambulatory Visit (HOSPITAL_COMMUNITY)
Admission: EM | Admit: 2020-04-04 | Discharge: 2020-04-04 | Disposition: A | Discharge status: Home / Self Care | Payer: Medicare Other | Attending: Family Medicine | Admitting: Family Medicine

## 2020-04-04 DIAGNOSIS — K068 Other specified disorders of gingiva and edentulous alveolar ridge: Secondary | ICD-10-CM | POA: Insufficient documentation

## 2020-04-04 DIAGNOSIS — K08409 Partial loss of teeth, unspecified cause, unspecified class: Secondary | ICD-10-CM | POA: Diagnosis present

## 2020-04-04 DIAGNOSIS — K1379 Other lesions of oral mucosa: Secondary | ICD-10-CM | POA: Diagnosis present

## 2020-04-04 HISTORY — DX: Kidney transplant status: Z94.0

## 2020-04-04 HISTORY — DX: Dependence on renal dialysis: Z99.2

## 2020-04-04 LAB — CBC WITH DIFFERENTIAL/PLATELET
Abs Immature Granulocytes: 0.05 10*3/uL (ref 0.00–0.07)
Basophils Absolute: 0 10*3/uL (ref 0.0–0.1)
Basophils Relative: 1 %
Eosinophils Absolute: 0 10*3/uL (ref 0.0–0.5)
Eosinophils Relative: 0 %
HCT: 33 % — ABNORMAL LOW (ref 36.0–46.0)
Hemoglobin: 10.1 g/dL — ABNORMAL LOW (ref 12.0–15.0)
Immature Granulocytes: 1 %
Lymphocytes Relative: 12 %
Lymphs Abs: 0.8 10*3/uL (ref 0.7–4.0)
MCH: 31.1 pg (ref 26.0–34.0)
MCHC: 30.6 g/dL (ref 30.0–36.0)
MCV: 101.5 fL — ABNORMAL HIGH (ref 80.0–100.0)
Monocytes Absolute: 0.4 10*3/uL (ref 0.1–1.0)
Monocytes Relative: 6 %
Neutro Abs: 5.5 10*3/uL (ref 1.7–7.7)
Neutrophils Relative %: 80 %
Platelets: 155 10*3/uL (ref 150–400)
RBC: 3.25 MIL/uL — ABNORMAL LOW (ref 3.87–5.11)
RDW: 14.8 % (ref 11.5–15.5)
WBC: 6.8 10*3/uL (ref 4.0–10.5)
nRBC: 0 % (ref 0.0–0.2)

## 2020-04-04 NOTE — ED Triage Notes (Addendum)
Reports having had 4 teeth extracted a couple wks ago; states has had episodes of bleeding twice - most recently yesterday, lasting approx 4-5 hrs.  Spoke with dentist - was told she needs to have blood checked due to bleeding.    Is a dialysis pt.

## 2020-04-04 NOTE — Discharge Instructions (Addendum)
We have drawn labs today and will be in touch with you if there is anything abnormal.  Follow up with your surgeon.

## 2020-04-05 ENCOUNTER — Telehealth (HOSPITAL_COMMUNITY): Payer: Self-pay | Admitting: Family Medicine

## 2020-04-05 NOTE — ED Provider Notes (Signed)
[place]   [id number] 04/04/20 Arrival Time: 5621  CC: DENTAL pain  SUBJECTIVE:  Kathleen [name] is a 54 y.o. female who presents with bleeding after tooth extraction x 1 week ago. Denies pain with bleeding. Bleeding increases with chewing. Denies similar symptoms in the past.  Denies fever, chills, dysphagia, odynophagia, oral or neck swelling, nausea, vomiting, chest pain, SOB. Has been eating regular diet. Has tried to apply ice to her cheek to help slow down the bleeding. Pt has extensive medical history including dialysis dependence and anemia with blood transfusion. States that she tried to call her surgeon and was told to go be seen at an office that could check her Hgb.  ROS: As per HPI.  All other pertinent ROS negative.     Past Medical History:  Diagnosis Date  . Anemia    iron def,   . Chronic kidney disease   . Hemodialysis patient ([facility])   . Hypertension   . Kidney transplant recipient   . Rheumatoid arthritis of the hand    Past Surgical History:  Procedure Laterality Date  . CESAREAN SECTION  1992  . HAND SURGERY Right 3/0/8657   silicone where the joints 3 MCP joints  . kiney transplant  2005, 2013    x 2   Allergies  Allergen Reactions  . Adhesive [Tape] Other (See Comments)    Removes skin - use paper tape  . Benazepril Swelling    Lips   . Magnesium-Containing Compounds Other (See Comments)    Infusion cause burning of the back. Can take oral agents containing Magnesium   No current facility-administered medications on file prior to encounter.   Current Outpatient Medications on File Prior to Encounter  Medication Sig Dispense Refill  . acetaminophen (TYLENOL) 500 MG tablet Take 650 mg by mouth every 6 (six) hours as needed for moderate pain.     Marland Kitchen amoxicillin (AMOXIL) 500 MG capsule Take 500 mg by mouth once as needed. Takes four capsules prior to dental procedures    . BISACODYL PO Take by mouth.    . cetirizine (ZYRTEC) 10 MG  tablet Take 10 mg by mouth daily.    Marland Kitchen gabapentin (NEURONTIN) 100 MG capsule Take 100 mg by mouth at bedtime. Take 3 capsules    . GUAIFENESIN PO Take by mouth.    . guanFACINE (TENEX) 1 MG tablet Take 1 mg by mouth at bedtime.    Marland Kitchen labetalol (NORMODYNE) 100 MG tablet Take 300 mg by mouth 2 (two) times daily.     . Multiple Vitamin (MULTIVITAMIN WITH MINERALS) TABS Take 1 tablet by mouth daily. Women vitamin    . Multiple Vitamins-Minerals (HAIR/SKIN/NAILS) TABS Take 1 tablet by mouth daily.    . ondansetron (ZOFRAN-ODT) 4 MG disintegrating tablet Take 4 mg by mouth as needed.    . predniSONE (DELTASONE) 5 MG tablet Take 5 mg by mouth daily with breakfast.    . RENVELA 0.8 g PACK packet Take 0.8 g by mouth 3 (three) times daily with meals. Takes 4 packets with meals Takes 3 packets with snack    . vitamin B-12 (CYANOCOBALAMIN) 1000 MCG tablet Take 1,000 mcg by mouth daily.    Marland Kitchen amitriptyline (ELAVIL) 25 MG tablet Take 25 mg by mouth daily.    Marland Kitchen lactulose (CHRONULAC) 10 GM/15ML solution Take 10 g by mouth 3 (three) times daily with meals.     Social History   Socioeconomic History  . Marital status: Single  Spouse name: Not on file  . Number of children: Not on file  . Years of education: Not on file  . Highest education level: Not on file  Occupational History  . Not on file  Tobacco Use  . Smoking status: Never Smoker  . Smokeless tobacco: Never Used  Substance and Sexual Activity  . Alcohol use: Yes    Alcohol/week: 0.0 standard drinks    Comment: occasional  . Drug use: No  . Sexual activity: Not on file  Other Topics Concern  . Not on file  Social History Narrative  . Not on file   Social Determinants of Health   Financial Resource Strain:   . Difficulty of Paying Living Expenses:   Food Insecurity:   . Worried About Charity fundraiser in the Last Year:   . Arboriculturist in the Last Year:   Transportation Needs:   . Film/video editor (Medical):   Marland Kitchen Lack of  Transportation (Non-Medical):   Physical Activity:   . Days of Exercise per Week:   . Minutes of Exercise per Session:   Stress:   . Feeling of Stress :   Social Connections:   . Frequency of Communication with Friends and Family:   . Frequency of Social Gatherings with Friends and Family:   . Attends Religious Services:   . Active Member of Clubs or Organizations:   . Attends Archivist Meetings:   Marland Kitchen Marital Status:   Intimate Partner Violence:   . Fear of Current or Ex-Partner:   . Emotionally Abused:   Marland Kitchen Physically Abused:   . Sexually Abused:    Family History  Problem Relation Age of Onset  . Healthy Mother   . Diabetes Maternal Grandmother   . Cancer Maternal Grandmother 86  . Breast cancer Neg Hx     OBJECTIVE:  Vitals:   04/04/20 1804  BP: (!) 163/93  Pulse: 91  Resp: 18  Temp: 97.9 F (36.6 C)  TempSrc: Oral  SpO2: 95%    General appearance: alert; no distress HENT: normocephalic; atraumatic; dentition:has had R lower molars extracted as well as L lower molars. Bleeding is localized to the surgical site of the extraction of the L molars. Neck: supple without LAD Lungs: normal respirations Skin: warm and dry Psychological: alert and cooperative; normal mood and affect  ASSESSMENT & PLAN:  1. Mouth pain   2. S/P tooth extraction   3. Bleeding gums     No orders of the defined types were placed in this encounter.     Recommended that she wet gauze and place it in the refrigerator to get it cold, then apply pressure to the bleeding area. Recommend soft diet until evaluated by oral surgeon Maintain oral hygiene care Follow up with dentist as soon as possible for further evaluation and treatment  Return or go to the ED if you have any new or worsening symptoms such as fever, chills, difficulty swallowing, painful swallowing, oral or neck swelling, nausea, vomiting, chest pain, SOB. CBC today, will notify of abnormal results.  Reviewed  expectations re: course of current medical issues. Questions answered. Outlined signs and symptoms indicating need for more acute intervention. Patient verbalized understanding. After Visit Summary given.    Faustino Congress, NP 04/05/20 [phone]

## 2020-04-05 NOTE — Telephone Encounter (Signed)
Spoke with Kathleen [name] this morning and discussed CBC results. I do not think that she needs a blood transfusion at dialysis tomorrow as her Hgb was 10.1. Platelets were 155, so I do not think that her bleeding from tooth extraction was excessive. She will be ok to follow up with the oral surgeon on Wednesday this week. She verbalizes understanding.

## 2020-10-16 ENCOUNTER — Other Ambulatory Visit: Payer: Self-pay

## 2020-10-16 ENCOUNTER — Emergency Department (HOSPITAL_COMMUNITY)
Admission: EM | Admit: 2020-10-16 | Discharge: 2020-10-16 | Disposition: A | Discharge status: Home / Self Care | Payer: Medicare Other | Attending: Emergency Medicine | Admitting: Emergency Medicine

## 2020-10-16 DIAGNOSIS — I130 Hypertensive heart and chronic kidney disease with heart failure and stage 1 through stage 4 chronic kidney disease, or unspecified chronic kidney disease: Secondary | ICD-10-CM | POA: Diagnosis not present

## 2020-10-16 DIAGNOSIS — I509 Heart failure, unspecified: Secondary | ICD-10-CM | POA: Diagnosis not present

## 2020-10-16 DIAGNOSIS — Z79899 Other long term (current) drug therapy: Secondary | ICD-10-CM | POA: Insufficient documentation

## 2020-10-16 DIAGNOSIS — Z940 Kidney transplant status: Secondary | ICD-10-CM | POA: Insufficient documentation

## 2020-10-16 DIAGNOSIS — N186 End stage renal disease: Secondary | ICD-10-CM | POA: Diagnosis not present

## 2020-10-16 DIAGNOSIS — R58 Hemorrhage, not elsewhere classified: Secondary | ICD-10-CM | POA: Diagnosis present

## 2020-10-16 DIAGNOSIS — N189 Chronic kidney disease, unspecified: Secondary | ICD-10-CM | POA: Diagnosis not present

## 2020-10-16 LAB — CBC WITH DIFFERENTIAL/PLATELET
Abs Immature Granulocytes: 0.03 10*3/uL (ref 0.00–0.07)
Basophils Absolute: 0 10*3/uL (ref 0.0–0.1)
Basophils Relative: 1 %
Eosinophils Absolute: 0.1 10*3/uL (ref 0.0–0.5)
Eosinophils Relative: 2 %
HCT: 31.8 % — ABNORMAL LOW (ref 36.0–46.0)
Hemoglobin: 9.6 g/dL — ABNORMAL LOW (ref 12.0–15.0)
Immature Granulocytes: 1 %
Lymphocytes Relative: 15 %
Lymphs Abs: 0.8 10*3/uL (ref 0.7–4.0)
MCH: 30.1 pg (ref 26.0–34.0)
MCHC: 30.2 g/dL (ref 30.0–36.0)
MCV: 99.7 fL (ref 80.0–100.0)
Monocytes Absolute: 0.5 10*3/uL (ref 0.1–1.0)
Monocytes Relative: 9 %
Neutro Abs: 3.8 10*3/uL (ref 1.7–7.7)
Neutrophils Relative %: 72 %
Platelets: 106 10*3/uL — ABNORMAL LOW (ref 150–400)
RBC: 3.19 MIL/uL — ABNORMAL LOW (ref 3.87–5.11)
RDW: 16.1 % — ABNORMAL HIGH (ref 11.5–15.5)
WBC: 5.3 10*3/uL (ref 4.0–10.5)
nRBC: 0 % (ref 0.0–0.2)

## 2020-10-16 LAB — TYPE AND SCREEN
ABO/RH(D): A POS
Antibody Screen: NEGATIVE

## 2020-10-16 LAB — BASIC METABOLIC PANEL WITH GFR
Anion gap: 11 (ref 5–15)
BUN: 24 mg/dL — ABNORMAL HIGH (ref 6–20)
CO2: 27 mmol/L (ref 22–32)
Calcium: 9.2 mg/dL (ref 8.9–10.3)
Chloride: 103 mmol/L (ref 98–111)
Creatinine, Ser: 6.14 mg/dL — ABNORMAL HIGH (ref 0.44–1.00)
GFR, Estimated: 8 mL/min — ABNORMAL LOW
Glucose, Bld: 85 mg/dL (ref 70–99)
Potassium: 3.9 mmol/L (ref 3.5–5.1)
Sodium: 141 mmol/L (ref 135–145)

## 2020-10-16 MED ORDER — LIDOCAINE HCL (PF) 1 % IJ SOLN
INTRAMUSCULAR | Status: AC
  Filled 2020-10-16: qty 30

## 2020-10-16 NOTE — ED Triage Notes (Signed)
Pt BIB [facility] w/ complaints of bleeding from prior hernia repair on the lower abdomen. Pt noticed at 1730 she started bleeding from the area and could not get it to stop. Bleeding controlled by holding pressure. Dr. Rex Kras at bedside stitching the site to control bleeding. VSS per EMS. NAD at this time.

## 2020-10-16 NOTE — ED Provider Notes (Signed)
[facility] EMERGENCY DEPARTMENT Provider Note   CSN: [id number] Arrival date & time: 10/16/20  1840     History CC: abdominal bleeding  Kathleen [name] is a 54 y.o. female with history of ESRD on HD and abdominal hernia presents with bleeding from the hernia repair that she first noticed that 5:30 PM.  She reports copious amounts of bleeding that was relieved with pressure.  Worse with no pressure and obvious bleeding when patient arrived.  No previous similar symptoms.  Per patient, she has a history of hernia repair with multiple complications requiring a skin graft.  Not associated with any abdominal pain.  No previous similar symptoms.  Had a full episode of dialysis today and reports being under her dry weight.  The history is provided by the patient and the EMS personnel.       Past Medical History:  Diagnosis Date   Anemia    iron def,    Chronic kidney disease    Hemodialysis patient [facility])    Hypertension    Kidney transplant recipient    Rheumatoid arthritis of the hand     Patient Active Problem List   Diagnosis Date Noted   Bell's palsy    Herpes zoster    Disseminated zoster 11/28/2015   Varicella 11/28/2015   Anemia of chronic kidney failure 11/28/2015   Accelerated hypertension 11/28/2015   Hyperkalemia 11/28/2015   Thyroid nodule 11/29/2013   Candidal skin infection 05/17/2012   Hypertension 05/17/2012   Rheumatoid arthritis of the hand 07/21/2011   Zoster 07/21/2011   Kidney replaced by transplant 07/21/2011   ANEMIA, OTHER, UNSPECIFIED 01/18/2007   RHINITIS, ALLERGIC 01/18/2007   CONSTIPATION 01/18/2007   AMENORRHEA 01/18/2007    Past Surgical History:  Procedure Laterality Date   CESAREAN SECTION  1992   HAND SURGERY Right 0/07/7352   silicone where the joints 3 MCP joints   kiney transplant  2005, 2013    x 2     OB History   No obstetric history on file.     Family History  Problem  Relation Age of Onset   Healthy Mother    Diabetes Maternal Grandmother    Cancer Maternal Grandmother 85   Breast cancer Neg Hx     Social History   Tobacco Use   Smoking status: Never Smoker   Smokeless tobacco: Never Used  Vaping Use   Vaping Use: Never used  Substance Use Topics   Alcohol use: Yes    Alcohol/week: 0.0 standard drinks    Comment: occasional   Drug use: No    Home Medications Prior to Admission medications   Medication Sig Start Date End Date Taking? Authorizing Provider  acetaminophen (TYLENOL) 500 MG tablet Take 650 mg by mouth every 6 (six) hours as needed for moderate pain.     [provider]  amitriptyline (ELAVIL) 25 MG tablet Take 25 mg by mouth daily. 11/08/18   [provider]  amoxicillin (AMOXIL) 500 MG capsule Take 500 mg by mouth once as needed. Takes four capsules prior to dental procedures 09/06/18   [provider]  BISACODYL PO Take by mouth.    [provider]  cetirizine (ZYRTEC) 10 MG tablet Take 10 mg by mouth daily. 12/07/18   [provider]  gabapentin (NEURONTIN) 100 MG capsule Take 100 mg by mouth at bedtime. Take 3 capsules 12/15/18   [provider]  GUAIFENESIN PO Take by mouth.    [provider]  guanFACINE (TENEX) 1 MG tablet Take 1 mg by mouth at bedtime.    [provider]  labetalol (NORMODYNE) 100 MG tablet Take 300 mg by mouth 2 (two) times daily.     [provider]  lactulose (CHRONULAC) 10 GM/15ML solution Take 10 g by mouth 3 (three) times daily with meals. 01/03/19   [provider]  Multiple Vitamin (MULTIVITAMIN WITH MINERALS) TABS Take 1 tablet by mouth daily. Women vitamin    [provider]  Multiple Vitamins-Minerals (HAIR/SKIN/NAILS) TABS Take 1 tablet by mouth daily.    [provider]  ondansetron (ZOFRAN-ODT) 4 MG disintegrating tablet Take 4 mg by mouth as needed. 10/15/18   [provider]  predniSONE (DELTASONE) 5 MG tablet Take 5 mg by mouth daily with breakfast.    [provider]  RENVELA 0.8 g PACK packet Take 0.8 g by mouth 3 (three) times daily with meals. Takes 4 packets with meals Takes 3 packets with snack 12/14/18   [provider]  vitamin B-12 (CYANOCOBALAMIN) 1000 MCG tablet Take 1,000 mcg by mouth daily. 02/27/18   [provider]    Allergies    Adhesive [tape], Benazepril, and Magnesium-containing compounds  Review of Systems   Review of Systems  Constitutional: Negative for chills and fever.  HENT: Negative for ear pain and sore throat.   Eyes: Negative for pain and visual disturbance.  Respiratory: Negative for cough and shortness of breath.   Cardiovascular: Negative for chest pain and palpitations.  Gastrointestinal: Negative for abdominal pain and vomiting.  Genitourinary: Negative for dysuria and hematuria.  Musculoskeletal: Negative for arthralgias and back pain.  Skin: Positive for wound. Negative for color change and rash.  Neurological: Negative for seizures and syncope.  All other systems reviewed and are negative.   Physical Exam Updated Vital Signs BP 140/81    Pulse 78    Temp 98.1 F (36.7 C) (Oral)    Resp (!) 23    Ht 5\' 5"  (1.651 m)    Wt 68 kg    LMP 01/10/2015    SpO2 97%    BMI 24.96 kg/m   Physical Exam Vitals and nursing note reviewed.  Constitutional:      General: She is not in acute distress.    Appearance: She is well-developed.  HENT:     Head: Normocephalic and atraumatic.  Eyes:     Conjunctiva/sclera: Conjunctivae normal.  Cardiovascular:     Rate and Rhythm: Normal rate and regular rhythm.     Heart sounds: Murmur heard.   Pulmonary:     Effort: Pulmonary effort is normal. No respiratory distress.     Breath sounds: Normal breath sounds.  Abdominal:     Palpations: Abdomen is soft.     Tenderness: There is no abdominal tenderness.     Hernia: A hernia (Large abdominal hernia  with remote overlying skin graft; soft, nontender. Punctate inferior wound ~80mm with constant bleeding that appears venous and is not pulsatile) is present.  Musculoskeletal:        General: No deformity or signs of injury. Normal range of motion.     Cervical back: Neck supple.  Skin:    General: Skin is warm and dry.  Neurological:     Mental Status: She is alert and oriented to person, place, and time. Mental status is at baseline.     ED Results / Procedures / Treatments   Labs (all labs ordered are listed, but only abnormal results  are displayed) Labs Reviewed  BASIC METABOLIC PANEL - Abnormal; Notable for the following components:      Result Value   BUN 24 (*)    Creatinine, Ser 6.14 (*)    GFR, Estimated 8 (*)    All other components within normal limits  CBC WITH DIFFERENTIAL/PLATELET - Abnormal; Notable for the following components:   RBC 3.19 (*)    Hemoglobin 9.6 (*)    HCT 31.8 (*)    RDW 16.1 (*)    Platelets 106 (*)    All other components within normal limits  TYPE AND SCREEN    EKG None  Radiology No results found.  Procedures .Marland KitchenLaceration Repair  Date/Time: 10/17/2020 1:30 AM Performed by: Darrick Huntsman, MD Authorized by: Kathleen Iles, MD   Consent:    Consent obtained:  Verbal and emergent situation   Consent given by:  Patient Laceration details:    Location:  Trunk   Trunk location: lower abdomen.   Length (cm):  0.3 Repair type:    Repair type:  Simple Exploration:    Hemostasis obtained with: Direct pressure and suture. Treatment:    Irrigation solution: Not irrigated as was actively bleeding. Skin repair:    Repair method:  Sutures   Suture size:  4-0   Suture material:  Prolene   Suture technique:  Figure eight   Number of sutures:  1 Approximation:    Approximation:  Close Post-procedure details:    Dressing: Adhesive gauze and Ace wrap for pressure.   Patient tolerance of procedure:  Tolerated well, no  immediate complications   (including critical care time)  Medications Ordered in ED Medications  lidocaine (PF) (XYLOCAINE) 1 % injection (  Given by Other 10/16/20 1916)    ED Course  I have reviewed the triage vital signs and the nursing notes.  Pertinent labs & imaging results that were available during my care of the patient were reviewed by me and considered in my medical decision making (see chart for details).    MDM Rules/Calculators/A&P                          MDM: Kathleen [name] is a 54 y.o. female who presents with abdominal bleeding as per above. I have reviewed the nursing documentation for past medical history, family history, and social history. Pertinent previous records reviewed. She is awake, alert. HDS. Afebrile. Physical exam is most notable for punctate wound to lower abdomen/hernia with constant bleeding that does not appear to be arterial.  Figure-of-8 suture as above which resulted in successful hemostasis.  Labs: Hemoglobin 9.6 which appears stable from prior, potassium 3.9, BUN 24 Consults: spoke with Dr. Kieth Brightly with general surgery Tx: Lidocaine and stage as above  Differential Dx: I am most concerned for bleeding due to likely superficial dilated vein and additional pressure from large abdominal hernia. Given history, physical exam, and work-up, I do not think she has arterial bleeding, intra-abdominal hemorrhage, bowel perforation, trauma, wound infection, or unstable hemorrhage  MDM: Kathleen [name] is a 54 y.o. female with a history of ESRD and complicated hernia repair presents with bleeding from punctate wound on her large abdominal hernia.  Bleeding/hemostasis obtained with single figure-of-eight suture.  Hemoglobin stable.  Per Dr. Kieth Brightly with general surgery, do not feel the emergent imaging is indicated at this time.  If bleeding is controlled, patient likely would benefit from outpatient follow-up with surgeons who completed her hernia  repair at  [place] [place] applied lower abdomen to apply more pressure to her wound.  No additional bleeding on multiple reassessments and wound remained hemostatic.  Patient hemodynamically stable.  Wound care discussed including keeping Ace bandage/pressure wrap on the wound for 24 hours and suture care.  Strict return precautions given including any new bleeding.  Recommended having the stitches taken out in about 10 days.  Additionally, discussed following up with Kathleen [name]  surgery who completed her initial hernia reparation.  Patient voiced understanding of all discharge instructions.  Patient amenable to this plan to discharge home.  Strict return precautions provided. Encouraged her to follow-up with her PCP on an outpatient basis. Questions were answered.  Patient discharged in stable condition.  The plan for this patient was discussed with Dr. Rex Kras, who voiced agreement and who oversaw evaluation and treatment of this patient.   Final Clinical Impression(s) / ED Diagnoses Final diagnoses:  None    Rx / DC Orders ED Discharge Orders    None       Kathleen Casamento, MD 10/17/20 [place], Wenda Overland, MD 10/19/20 [phone]

## 2020-10-16 NOTE — Discharge Instructions (Signed)
Follow-up with the surgeons at [facility] the Ace bandage on for 24 hours prior to taking.  If you notice any new bleeding, come back to the ER.  The stitches will need to be taken out in about 10 days which can be done at an urgent care, your primary care doctor, or in the ER.  Avoid any activities that increase pressure in your abdomen which includes avoiding lifting heavy objects and straining with bowel movements (for example if you are constipated, can take stool softeners or other medications that they have softer stools and are not straining while having a bowel movement)

## 2020-10-16 NOTE — ED Notes (Signed)
Patient verbalizes understanding of discharge instructions. Opportunity for questioning and answers were provided. Armband removed by staff, pt discharged from ED via wheelchair.  

## 2020-10-20 ENCOUNTER — Other Ambulatory Visit: Payer: Self-pay | Admitting: *Deleted

## 2020-10-20 ENCOUNTER — Other Ambulatory Visit: Payer: Self-pay | Admitting: Psychology

## 2020-10-20 DIAGNOSIS — Z1231 Encounter for screening mammogram for malignant neoplasm of breast: Secondary | ICD-10-CM

## 2020-10-25 ENCOUNTER — Other Ambulatory Visit: Payer: Self-pay

## 2020-10-25 ENCOUNTER — Emergency Department (HOSPITAL_COMMUNITY)
Admission: EM | Admit: 2020-10-25 | Discharge: 2020-10-25 | Disposition: A | Discharge status: Home / Self Care | Payer: Medicare Other | Attending: Emergency Medicine | Admitting: Emergency Medicine

## 2020-10-25 DIAGNOSIS — R58 Hemorrhage, not elsewhere classified: Secondary | ICD-10-CM

## 2020-10-25 DIAGNOSIS — Z940 Kidney transplant status: Secondary | ICD-10-CM | POA: Insufficient documentation

## 2020-10-25 DIAGNOSIS — N186 End stage renal disease: Secondary | ICD-10-CM | POA: Diagnosis not present

## 2020-10-25 DIAGNOSIS — L7622 Postprocedural hemorrhage and hematoma of skin and subcutaneous tissue following other procedure: Secondary | ICD-10-CM | POA: Insufficient documentation

## 2020-10-25 DIAGNOSIS — I120 Hypertensive chronic kidney disease with stage 5 chronic kidney disease or end stage renal disease: Secondary | ICD-10-CM | POA: Insufficient documentation

## 2020-10-25 DIAGNOSIS — Z79899 Other long term (current) drug therapy: Secondary | ICD-10-CM | POA: Insufficient documentation

## 2020-10-25 DIAGNOSIS — Z992 Dependence on renal dialysis: Secondary | ICD-10-CM | POA: Insufficient documentation

## 2020-10-25 LAB — BASIC METABOLIC PANEL
Anion gap: 12 (ref 5–15)
BUN: 34 mg/dL — ABNORMAL HIGH (ref 6–20)
CO2: 24 mmol/L (ref 22–32)
Calcium: 9.5 mg/dL (ref 8.9–10.3)
Chloride: 104 mmol/L (ref 98–111)
Creatinine, Ser: 8.01 mg/dL — ABNORMAL HIGH (ref 0.44–1.00)
GFR, Estimated: 6 mL/min — ABNORMAL LOW (ref >60)
Glucose, Bld: 112 mg/dL — ABNORMAL HIGH (ref 70–99)
Potassium: 4.1 mmol/L (ref 3.5–5.1)
Sodium: 140 mmol/L (ref 135–145)

## 2020-10-25 LAB — CBC WITH DIFFERENTIAL/PLATELET
Abs Immature Granulocytes: 0.02 10*3/uL (ref 0.00–0.07)
Basophils Absolute: 0.1 10*3/uL (ref 0.0–0.1)
Basophils Relative: 1 %
Eosinophils Absolute: 0.1 10*3/uL (ref 0.0–0.5)
Eosinophils Relative: 1 %
HCT: 35.2 % — ABNORMAL LOW (ref 36.0–46.0)
Hemoglobin: 10.3 g/dL — ABNORMAL LOW (ref 12.0–15.0)
Immature Granulocytes: 0 %
Lymphocytes Relative: 13 %
Lymphs Abs: 0.7 10*3/uL (ref 0.7–4.0)
MCH: 29.5 pg (ref 26.0–34.0)
MCHC: 29.3 g/dL — ABNORMAL LOW (ref 30.0–36.0)
MCV: 100.9 fL — ABNORMAL HIGH (ref 80.0–100.0)
Monocytes Absolute: 0.4 10*3/uL (ref 0.1–1.0)
Monocytes Relative: 8 %
Neutro Abs: 4.4 10*3/uL (ref 1.7–7.7)
Neutrophils Relative %: 77 %
Platelets: 97 10*3/uL — ABNORMAL LOW (ref 150–400)
RBC: 3.49 MIL/uL — ABNORMAL LOW (ref 3.87–5.11)
RDW: 16.4 % — ABNORMAL HIGH (ref 11.5–15.5)
WBC: 5.6 10*3/uL (ref 4.0–10.5)
nRBC: 0 % (ref 0.0–0.2)

## 2020-10-25 NOTE — ED Triage Notes (Signed)
BIB [facility] w/ c/o of bleeding from prior hernia repair on lower abdomen. PT noticed she was bleeding after running an errand to [employer] around 0930.  PT had repair in ED on  Friday  12/3. PT denies pain. PT hx of hernia repair, dialysis   HR 88 RR 19 100 RA 169/103 B/P

## 2020-10-25 NOTE — ED Provider Notes (Signed)
[facility] EMERGENCY DEPARTMENT Provider Note   CSN: [id number] Arrival date & time: 10/25/20  1021     History Chief Complaint  Patient presents with  . hernia site bleeding    Kathleen [name] is a 54 y.o. female.  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  Patient was seen on 10/16/2020 due to bleeding from a lower part of her abdominal skin graft.  A stitch was placed and bleeding was resolved.  Patient reports she has been doing well.  She has not missed any dialysis treatments and has been taking her medications as prescribed.  She does not take anticoagulants.  Today she was sitting in her car and when she went to stand up felt something wet in a gush of blood going down her legs.  She denies any other symptoms at the time and called the ambulance.  When EMS arrived the bleeding had subsided.  Patient denies any vaginal or rectal bleeding.  She has no abdominal pain.  She always has some coughing but denies any increased cough.  The history is provided by the patient.       Past Medical History:  Diagnosis Date  . Anemia    iron def,   . Chronic kidney disease   . Hemodialysis patient ([facility])   . Hypertension   . Kidney transplant recipient   . Rheumatoid arthritis of the hand     Patient Active Problem List   Diagnosis Date Noted  . Bell's palsy   . Herpes zoster   . Disseminated zoster 11/28/2015  . Varicella 11/28/2015  . Anemia of chronic kidney failure 11/28/2015  . Accelerated hypertension 11/28/2015  . Hyperkalemia 11/28/2015  . Thyroid nodule 11/29/2013  . Candidal skin infection 05/17/2012  . Hypertension 05/17/2012  . Rheumatoid arthritis of the hand 07/21/2011  . Zoster 07/21/2011  .  Kidney replaced by transplant 07/21/2011  . ANEMIA, OTHER, UNSPECIFIED 01/18/2007  . RHINITIS, ALLERGIC 01/18/2007  . CONSTIPATION 01/18/2007  . AMENORRHEA 01/18/2007    Past Surgical History:  Procedure Laterality Date  . CESAREAN SECTION  1992  . HAND SURGERY Right 02/21/3294   silicone where the joints 3 MCP joints  . kiney transplant  2005, 2013    x 2     OB History   No obstetric history on file.     Family History  Problem Relation Age of Onset  . Healthy Mother   . Diabetes Maternal Grandmother   . Cancer Maternal Grandmother 38  . Breast cancer Neg Hx     Social History   Tobacco Use  . Smoking status: Never Smoker  . Smokeless tobacco: Never Used  Vaping Use  . Vaping Use: Never used  Substance Use Topics  . Alcohol use: Yes    Alcohol/week: 0.0 standard drinks    Comment: occasional  . Drug use: No    Home Medications Prior to Admission medications   Medication Sig Start Date End Date Taking? Authorizing Provider  acetaminophen (TYLENOL) 500 MG tablet Take 650 mg by mouth every 6 (six) hours as needed for moderate pain.     [provider]  amitriptyline (ELAVIL) 25 MG tablet Take 25 mg by mouth daily. Patient not taking: Reported on 10/16/2020 11/08/18   [provider]  ARTIFICIAL TEAR OP Place 1 drop into both eyes in the morning, at noon, in the evening, and at bedtime.    [provider]  BISACODYL PO Take by mouth.    [provider]  cetirizine (ZYRTEC) 10 MG tablet Take 10 mg by mouth daily. 12/07/18   [provider]  diclofenac Sodium (VOLTAREN) 1 % GEL Apply 2 g topically daily as needed (For feet pain).    [provider]  gabapentin (NEURONTIN) 100 MG capsule Take 100 mg by mouth at bedtime. Take 3 capsules 12/15/18   [provider]  ipratropium (ATROVENT) 0.06 % nasal spray Place 2 sprays into both nostrils 4 (four) times daily.    [provider]  labetalol (NORMODYNE)  100 MG tablet Take 300 mg by mouth 3 (three) times daily.     [provider]  ondansetron (ZOFRAN-ODT) 4 MG disintegrating tablet Take 4 mg by mouth as needed. 10/15/18   [provider]  predniSONE (DELTASONE) 5 MG tablet Take 5 mg by mouth daily with breakfast.    [provider]  RENVELA 0.8 g PACK packet Take 0.8 g by mouth 3 (three) times daily with meals. Takes 3 packets packs 3 times daily with meals and take 2 packs with snacks 12/14/18   [provider]  vitamin B-12 (CYANOCOBALAMIN) 1000 MCG tablet Take 1,000 mcg by mouth daily. 02/27/18   [provider]    Allergies    Adhesive [tape], Benazepril, and Magnesium-containing compounds  Review of Systems   Review of Systems  All other systems reviewed and are negative.   Physical Exam Updated Vital Signs BP (!) 169/103 (BP Location: Right Arm)   Pulse 92   Temp 98.1 F (36.7 C) (Oral)   Resp 20   LMP 01/10/2015   SpO2 99%   Physical Exam Vitals and nursing note reviewed.  Constitutional:      General: She is not in acute distress.    Appearance: Normal appearance. She is well-developed.  HENT:     Head: Normocephalic and atraumatic.  Eyes:     Pupils: Pupils are equal, round, and reactive to light.  Cardiovascular:     Rate and Rhythm: Normal rate and regular rhythm.     Heart sounds: Normal heart sounds. No murmur heard.  No friction rub.  Pulmonary:     Effort: Pulmonary effort is normal.     Breath sounds: Normal breath sounds. No wheezing or rales.  Abdominal:     General: Bowel sounds are normal. There is no distension.     Palpations: Abdomen is soft.     Tenderness: There is no abdominal tenderness. There is no guarding or rebound.     Hernia: A hernia is present.     Comments: Large ventral hernia that encompasses most of her mid abdomen.  Soft and and nontender.  1 suture placed at 6:00 in the lower part of the hernia.  No bleeding present at this time.  No  oozing of blood.  Genitourinary:    Comments: No vaginal or rectal bleeding noted Musculoskeletal:        General: No tenderness. Normal range of motion.     Right lower leg: Edema present.     Left lower leg: Edema present.     Comments: 1+ pitting edema bilateral lower extremities  Skin:    General: Skin is warm and dry.     Findings: No rash.  Neurological:     General: No focal  deficit present.     Mental Status: She is alert and oriented to person, place, and time. Mental status is at baseline.     Cranial Nerves: No cranial nerve deficit.  Psychiatric:        Mood and Affect: Mood normal.        Behavior: Behavior normal.        Thought Content: Thought content normal.     ED Results / Procedures / Treatments   Labs (all labs ordered are listed, but only abnormal results are displayed) Labs Reviewed  CBC WITH DIFFERENTIAL/PLATELET - Abnormal; Notable for the following components:      Result Value   RBC 3.49 (*)    Hemoglobin 10.3 (*)    HCT 35.2 (*)    MCV 100.9 (*)    MCHC 29.3 (*)    RDW 16.4 (*)    Platelets 97 (*)    All other components within normal limits  BASIC METABOLIC PANEL - Abnormal; Notable for the following components:   Glucose, Bld 112 (*)    BUN 34 (*)    Creatinine, Ser 8.01 (*)    GFR, Estimated 6 (*)    All other components within normal limits    EKG None  Radiology No results found.  Procedures Procedures (including critical care time)  Medications Ordered in ED Medications - No data to display  ED Course  I have reviewed the triage vital signs and the nursing notes.  Pertinent labs & imaging results that were available during my care of the patient were reviewed by me and considered in my medical decision making (see chart for details).    MDM Rules/Calculators/A&P                          With symptoms most consistent with rebleeding of an area on her abdominal wall graft.  Suture is still intact without evidence of  infection.  Currently no bleeding at this time.  Patient has no other source of where the bleeding would have come from.  She is not anticoagulated.  She has been doing her dialysis as scheduled and has not missed any treatments.  She is well-appearing on exam vital signs are reassuring.  Will continue to monitor to ensure no further bleeding.  We will get an abdominal binder so patient can apply pressure to the area.  She is following up with [facility] on Tuesday for repeat evaluation as she would like to have her abdominal wall defect fixed.  1:53 PM Labs are stable and pt has had no repeat bleeding.  Will d/c home.  MDM Number of Diagnoses or Management Options   Amount and/or Complexity of Data Reviewed Clinical lab tests: ordered and reviewed Decide to obtain previous medical records or to obtain history from someone other than the patient: yes Review and summarize past medical records: yes Independent visualization of images, tracings, or specimens: yes  Risk of Complications, Morbidity, and/or Mortality Presenting problems: moderate Diagnostic procedures: low Management options: minimal  Patient Progress Patient progress: stable    Final Clinical Impression(s) / ED Diagnoses Final diagnoses:  Bleeding    Rx / DC Orders ED Discharge Orders    None       Blanchie Dessert, MD 10/25/20 1354

## 2020-10-25 NOTE — Discharge Instructions (Signed)
Wear the binder to help with compression until Tuesday.  Your hemoglobin today was 10.3 and platelets of 97 which need to be watched.  Return if bleeding starts again and wont' stop.

## 2020-10-26 ENCOUNTER — Other Ambulatory Visit: Payer: Self-pay

## 2020-10-26 ENCOUNTER — Encounter (HOSPITAL_COMMUNITY): Payer: Self-pay

## 2020-10-26 ENCOUNTER — Emergency Department (HOSPITAL_COMMUNITY)
Admission: EM | Admit: 2020-10-26 | Discharge: 2020-10-26 | Disposition: A | Discharge status: Home / Self Care | Payer: Medicare Other | Attending: Emergency Medicine | Admitting: Emergency Medicine

## 2020-10-26 DIAGNOSIS — S31109A Unspecified open wound of abdominal wall, unspecified quadrant without penetration into peritoneal cavity, initial encounter: Secondary | ICD-10-CM | POA: Diagnosis not present

## 2020-10-26 DIAGNOSIS — N186 End stage renal disease: Secondary | ICD-10-CM | POA: Diagnosis not present

## 2020-10-26 DIAGNOSIS — I120 Hypertensive chronic kidney disease with stage 5 chronic kidney disease or end stage renal disease: Secondary | ICD-10-CM | POA: Insufficient documentation

## 2020-10-26 DIAGNOSIS — Z992 Dependence on renal dialysis: Secondary | ICD-10-CM | POA: Diagnosis not present

## 2020-10-26 DIAGNOSIS — Z79899 Other long term (current) drug therapy: Secondary | ICD-10-CM | POA: Diagnosis not present

## 2020-10-26 DIAGNOSIS — D500 Iron deficiency anemia secondary to blood loss (chronic): Secondary | ICD-10-CM | POA: Diagnosis not present

## 2020-10-26 DIAGNOSIS — X58XXXA Exposure to other specified factors, initial encounter: Secondary | ICD-10-CM | POA: Diagnosis not present

## 2020-10-26 DIAGNOSIS — S3991XA Unspecified injury of abdomen, initial encounter: Secondary | ICD-10-CM | POA: Diagnosis present

## 2020-10-26 LAB — PREPARE RBC (CROSSMATCH)

## 2020-10-26 LAB — CBC
HCT: 22.3 % — ABNORMAL LOW (ref 36.0–46.0)
Hemoglobin: 6.9 g/dL — CL (ref 12.0–15.0)
MCH: 31.1 pg (ref 26.0–34.0)
MCHC: 30.9 g/dL (ref 30.0–36.0)
MCV: 100.5 fL — ABNORMAL HIGH (ref 80.0–100.0)
Platelets: 99 10*3/uL — ABNORMAL LOW (ref 150–400)
RBC: 2.22 MIL/uL — ABNORMAL LOW (ref 3.87–5.11)
RDW: 16.2 % — ABNORMAL HIGH (ref 11.5–15.5)
WBC: 7.6 10*3/uL (ref 4.0–10.5)
nRBC: 0 % (ref 0.0–0.2)

## 2020-10-26 LAB — BASIC METABOLIC PANEL
Anion gap: 12 (ref 5–15)
BUN: 43 mg/dL — ABNORMAL HIGH (ref 6–20)
CO2: 23 mmol/L (ref 22–32)
Calcium: 8.8 mg/dL — ABNORMAL LOW (ref 8.9–10.3)
Chloride: 105 mmol/L (ref 98–111)
Creatinine, Ser: 8.98 mg/dL — ABNORMAL HIGH (ref 0.44–1.00)
GFR, Estimated: 5 mL/min — ABNORMAL LOW (ref >60)
Glucose, Bld: 83 mg/dL (ref 70–99)
Potassium: 4.4 mmol/L (ref 3.5–5.1)
Sodium: 140 mmol/L (ref 135–145)

## 2020-10-26 MED ORDER — ONDANSETRON HCL 4 MG/2ML IJ SOLN
4.0000 mg | Freq: Once | INTRAMUSCULAR | Status: DC
  Filled 2020-10-26: qty 2

## 2020-10-26 MED ORDER — ONDANSETRON 4 MG PO TBDP
4.0000 mg | ORAL_TABLET | Freq: Once | ORAL | Status: AC
  Administered 2020-10-26: 4 mg via ORAL
  Filled 2020-10-26: qty 1

## 2020-10-26 MED ORDER — SODIUM CHLORIDE 0.9 % IV SOLN: 10.0000 mL/h | Freq: Once | INTRAVENOUS | Status: DC

## 2020-10-26 MED ORDER — LIDOCAINE-EPINEPHRINE 1 %-1:100000 IJ SOLN
20.0000 mL | Freq: Once | INTRAMUSCULAR | Status: AC
  Administered 2020-10-26: 20 mL via INTRADERMAL
  Filled 2020-10-26: qty 1

## 2020-10-26 NOTE — ED Triage Notes (Signed)
Pt coming from home with c/o bleeding from bottom of central abdomen hernia repair. Pt recently d/c with stitches to area. Pt estimated blood loss 500-1L today. Dialysis MWF. No dizziness.

## 2020-10-26 NOTE — ED Notes (Signed)
She did not want me to get her type and screen for blood transfusion. I explained to her that the transfer process takes time and while waiting to get her blood transfusion here. She said "no". MD notified via chat.

## 2020-10-26 NOTE — Progress Notes (Signed)
Patient came to the ED with bleeding from her abdomen (subcutaneous?), see ED notes for significant history.  By the time she arrived she had stopped bleeding.  Her stitch from over a week ago is still in place and appears scabbed over.  Unclear if this is the same location that patient is bleeding from.  She really chooses to not answer any of my questions.  ED has called [facility] who follows her for all of her abdominal issues.  She is being transferred to [facility] for further care.  No acute surgical needs.  Discussed with EDPA, Josh.  Henreitta Cea 12:03 PM 10/26/2020

## 2020-10-26 NOTE — ED Provider Notes (Signed)
[facility] EMERGENCY DEPARTMENT Provider Note   CSN: [id number] Arrival date & time: 10/26/20  0556     History Chief Complaint  Patient presents with  . Wound Check    Kathleen [name] is a 54 y.o. female.  Patient with history of ventral hernia which required skin grafting to complete closure, hemodialysis (M/W/F), no anticoagulation --presents to the emergency department for bleeding from the abdominal wall.  Patient was seen in the emergency department and once in November for the same symptoms.  Patient was discharged yesterday with plans to follow-up at [facility] tomorrow.  Patient states that she awoke this morning, before she had to leave for dialysis, to take a shower.  When she stood up in the shower she had a large "gush of blood" from the abdomen.  She did not have time to grab the binder that was given to her yesterday.  EMS was called for transport to the hospital.  Bleeding controlled on arrival.  No lightheadedness, syncope, shortness of breath.  Bleeding episode similar to previous.        Past Medical History:  Diagnosis Date  . Anemia    iron def,   . Chronic kidney disease   . Hemodialysis patient ([place] [facility])   . Hypertension   . Kidney transplant recipient   . Rheumatoid arthritis of the hand     Patient Active Problem List   Diagnosis Date Noted  . Bell's palsy   . Herpes zoster   . Disseminated zoster 11/28/2015  . Varicella 11/28/2015  . Anemia of chronic kidney failure 11/28/2015  . Accelerated hypertension 11/28/2015  . Hyperkalemia 11/28/2015  . Thyroid nodule 11/29/2013  . Candidal skin infection 05/17/2012  . Hypertension 05/17/2012  . Rheumatoid arthritis of the hand 07/21/2011  . Zoster 07/21/2011  . Kidney replaced by transplant 07/21/2011  . ANEMIA, OTHER, UNSPECIFIED 01/18/2007  . RHINITIS, ALLERGIC 01/18/2007  . CONSTIPATION 01/18/2007  . AMENORRHEA 01/18/2007    Past Surgical History:  Procedure Laterality  Date  . CESAREAN SECTION  1992  . HAND SURGERY Right 02/22/3153   silicone where the joints 3 MCP joints  . kiney transplant  2005, 2013    x 2     OB History   No obstetric history on file.     Family History  Problem Relation Age of Onset  . Healthy Mother   . Diabetes Maternal Grandmother   . Cancer Maternal Grandmother 30  . Breast cancer Neg Hx     Social History   Tobacco Use  . Smoking status: Never Smoker  . Smokeless tobacco: Never Used  Vaping Use  . Vaping Use: Never used  Substance Use Topics  . Alcohol use: Yes    Alcohol/week: 0.0 standard drinks    Comment: occasional  . Drug use: No    Home Medications Prior to Admission medications   Medication Sig Start Date End Date Taking? Authorizing Provider  acetaminophen (TYLENOL) 500 MG tablet Take 650 mg by mouth every 6 (six) hours as needed for moderate pain.     [provider]  amitriptyline (ELAVIL) 25 MG tablet Take 25 mg by mouth daily. Patient not taking: Reported on 10/16/2020 11/08/18   [provider]  ARTIFICIAL TEAR OP Place 1 drop into both eyes in the morning, at noon, in the evening, and at bedtime.    [provider]  BISACODYL PO Take by mouth.    [provider]  cetirizine (ZYRTEC) 10 MG  tablet Take 10 mg by mouth daily. 12/07/18   [provider]  diclofenac Sodium (VOLTAREN) 1 % GEL Apply 2 g topically daily as needed (For feet pain).    [provider]  gabapentin (NEURONTIN) 100 MG capsule Take 100 mg by mouth at bedtime. Take 3 capsules 12/15/18   [provider]  ipratropium (ATROVENT) 0.06 % nasal spray Place 2 sprays into both nostrils 4 (four) times daily.    [provider]  labetalol (NORMODYNE) 100 MG tablet Take 300 mg by mouth 3 (three) times daily.     [provider]  ondansetron (ZOFRAN-ODT) 4 MG disintegrating tablet Take 4 mg by mouth as needed. 10/15/18   [provider]  predniSONE  (DELTASONE) 5 MG tablet Take 5 mg by mouth daily with breakfast.    [provider]  RENVELA 0.8 g PACK packet Take 0.8 g by mouth 3 (three) times daily with meals. Takes 3 packets packs 3 times daily with meals and take 2 packs with snacks 12/14/18   [provider]  vitamin B-12 (CYANOCOBALAMIN) 1000 MCG tablet Take 1,000 mcg by mouth daily. 02/27/18   [provider]    Allergies    Adhesive [tape], Benazepril, and Magnesium-containing compounds  Review of Systems   Review of Systems  Constitutional: Negative for fever.  HENT: Negative for nosebleeds.   Eyes: Negative for redness.  Respiratory: Negative for cough and shortness of breath.   Cardiovascular: Negative for chest pain.  Gastrointestinal: Negative for abdominal pain.  Genitourinary: Negative for hematuria.  Musculoskeletal: Negative for myalgias.  Skin: Positive for wound. Negative for rash.  Neurological: Negative for syncope and light-headedness.    Physical Exam Updated Vital Signs BP 135/78 (BP Location: Right Arm)   Pulse 86   Temp 97.9 F (36.6 C) (Oral)   Resp 19   Ht 5\' 3"  (1.6 m)   Wt 60.3 kg   LMP 01/10/2015   SpO2 100%   BMI 23.56 kg/m   Physical Exam Vitals and nursing note reviewed.  Constitutional:      General: She is not in acute distress.    Appearance: She is well-developed.  HENT:     Head: Normocephalic and atraumatic.     Right Ear: External ear normal.     Left Ear: External ear normal.     Nose: Nose normal.  Eyes:     Conjunctiva/sclera: Conjunctivae normal.  Cardiovascular:     Rate and Rhythm: Normal rate and regular rhythm.     Heart sounds: No murmur heard.   Pulmonary:     Effort: No respiratory distress.     Breath sounds: No wheezing, rhonchi or rales.  Abdominal:     Palpations: Abdomen is soft.     Tenderness: There is no abdominal tenderness. There is no guarding or rebound.     Comments: Patient with large ventral hernia.  Over the  inferior portion at the midline, there is one Prolene suture in place.  There appears to be no active bleeding.  Abdomen is soft and nontender.  Patient states that she feels more protrusion than normal, likely due to the fact that she is due for dialysis.  Musculoskeletal:     Cervical back: Normal range of motion and neck supple.     Right lower leg: No edema.     Left lower leg: No edema.     Comments: AV fistula R upper extremity.   Skin:    General: Skin is warm  and dry.     Findings: No rash.  Neurological:     General: No focal deficit present.     Mental Status: She is alert. Mental status is at baseline.     Motor: No weakness.  Psychiatric:        Mood and Affect: Mood normal.     ED Results / Procedures / Treatments   Labs (all labs ordered are listed, but only abnormal results are displayed) Labs Reviewed  CBC - Abnormal; Notable for the following components:      Result Value   RBC 2.22 (*)    Hemoglobin 6.9 (*)    HCT 22.3 (*)    MCV 100.5 (*)    RDW 16.2 (*)    Platelets 99 (*)    All other components within normal limits  BASIC METABOLIC PANEL - Abnormal; Notable for the following components:   BUN 43 (*)    Creatinine, Ser 8.98 (*)    Calcium 8.8 (*)    GFR, Estimated 5 (*)    All other components within normal limits  RESP PANEL BY RT-PCR (FLU A&B, COVID) ARPGX2  TYPE AND SCREEN  PREPARE RBC (CROSSMATCH)    EKG None  Radiology No results found.  Procedures Procedures (including critical care time)  Medications Ordered in ED Medications  0.9 %  sodium chloride infusion (has no administration in time range)  ondansetron (ZOFRAN) injection 4 mg (has no administration in time range)  lidocaine-EPINEPHrine (XYLOCAINE W/EPI) 1 %-1:100000 (with pres) injection 20 mL (20 mLs Intradermal Given 10/26/20 1242)  ondansetron (ZOFRAN-ODT) disintegrating tablet 4 mg (4 mg Oral Given 10/26/20 1241)    ED Course  I have reviewed the triage vital signs and  the nursing notes.  Pertinent labs & imaging results that were available during my care of the patient were reviewed by me and considered in my medical decision making (see chart for details).  Patient seen and examined. Given another episode of blood loss and patient due for dialysis today, will check CBC and BMP.  Will discuss with attending physician.  This will give [place] time to monitor the patient for additional bleeding.  I am not sure what other treatment possibilities we could offer at this point.  I do not think she needs another stitch in her abdominal wall.   Vital signs reviewed and are as follows: BP 135/78 (BP Location: Right Arm)   Pulse 86   Temp 97.9 F (36.6 C) (Oral)   Resp 19   Ht 5\' 3"  (1.6 m)   Wt 60.3 kg   LMP 01/10/2015   SpO2 100%   BMI 23.56 kg/m   10:07 AM Pt discussed with and seen earlier by Dr. Roslynn Amble.   Labs demonstrate anemia likely due to blood loss, 10.3 --> 6.9.  Normal potassium.  I went and reevaluated and updated the patient.  She is eating a sandwich and has juice and applesauce at bedside.  We discussed possible need for blood transfusion and admission for monitoring.  Discussed that we will discuss case with our surgeons and arrange for dialysis if necessary.  Patient is unwilling to tell me the name of her nephrologist or her dialysis center.  Discussed labs with Dr. Roslynn Amble.   11:12 AM Per RN, patient requesting transfer to [facility].  Pt becoming more agitated.  RN discussed need for medical stabilization -- patient stating that she will sign AMA and driver herself if she needs to.  Pt seen at bedside by  Dr. Roslynn Amble. I have called PAL at [facility] -- awaiting callback from general surgery. Will update patient after this conversation.   11:36 AM Spoke with Dr. Marissa Calamity at [facility] who requests we send patient to the Emergency Department there. He accepts patient.   I updated patient on plan, she is agreeable. Will give ice chips as surgery  is very unlikely. She continues to refuse any additional work-up/treatment here (ie type and screen/blood transfusion). Cancelled surgery consult here.   12:49 PM Called to patient room due to bleeding. The area started bleeding again.  Dark, nonpulsatile bleeding noted to be coming from the small defect, previously closed with figure-of-eight suture.  Bleeding was brisk however easily controlled with pressure.  Pinching the outside of the skin controlled bleeding.  Wound was evaluated with Dr. Roslynn Amble.  Combat gauze was applied and materials were gathered to place an additional figure-of-eight stitch.  This was done by Dr. Roslynn Amble.  Good hemostasis.  Patient unchanged hemodynamically with normal blood pressure and no tachycardia.  Wound was then bandaged by RN and myself.  Combat gauze applied, 4 x 4 overlaid, and wrapped torso with roller gauze.  At this time transport arrived.  RN attempting to gain access prior to transfer.  ODT Zofran given for nausea.   1:01 PM EMTALA completed.    CRITICAL CARE Performed by: Carlisle Cater PA-C Total critical care time: 55 minutes Critical care time was exclusive of separately billable procedures and treating other patients. Critical care was necessary to treat or prevent imminent or life-threatening deterioration. Critical care was time spent personally by me on the following activities: development of treatment plan with patient and/or surrogate as well as nursing, discussions with consultants, evaluation of patient's response to treatment, examination of patient, obtaining history from patient or surrogate, ordering and performing treatments and interventions, ordering and review of laboratory studies, ordering and review of radiographic studies, pulse oximetry and re-evaluation of patient's condition.     MDM Rules/Calculators/A&P                          Transfer to [facility] center.    Final Clinical Impression(s) / ED  Diagnoses Final diagnoses:  Blood loss anemia  ESRD on dialysis [facility])  Open wound of abdominal wall, initial encounter    Rx / DC Orders ED Discharge Orders    None       Carlisle Cater, PA-C 10/26/20 1301    Lucrezia Starch, MD 10/27/20 [phone]

## 2020-10-26 NOTE — ED Notes (Signed)
Pt bleeding is controlled at this time. Pt has abd pad in place.

## 2020-10-26 NOTE — ED Notes (Addendum)
Pt refusing any procedure at [facility] at this time. Pt said she wants to be transferred to [facility]. Attending and PA-C notified. Dr. Roslynn Amble was at bedside.

## 2021-02-09 ENCOUNTER — Ambulatory Visit
Admission: RE | Admit: 2021-02-09 | Discharge: 2021-02-09 | Disposition: A | Discharge status: Home / Self Care | Payer: Medicare Other | Source: Ambulatory Visit | Attending: *Deleted | Admitting: *Deleted

## 2021-02-09 ENCOUNTER — Other Ambulatory Visit: Payer: Self-pay

## 2021-02-09 DIAGNOSIS — Z1231 Encounter for screening mammogram for malignant neoplasm of breast: Secondary | ICD-10-CM

## 2021-02-11 ENCOUNTER — Other Ambulatory Visit: Payer: Self-pay | Admitting: Psychology

## 2021-02-11 ENCOUNTER — Other Ambulatory Visit: Payer: Self-pay | Admitting: *Deleted

## 2021-02-11 DIAGNOSIS — R928 Other abnormal and inconclusive findings on diagnostic imaging of breast: Secondary | ICD-10-CM

## 2021-06-15 ENCOUNTER — Ambulatory Visit (HOSPITAL_COMMUNITY)
Admission: EM | Admit: 2021-06-15 | Discharge: 2021-06-15 | Disposition: A | Discharge status: Home / Self Care | Payer: Medicare Other | Attending: Internal Medicine | Admitting: Internal Medicine

## 2021-06-15 ENCOUNTER — Ambulatory Visit (INDEPENDENT_AMBULATORY_CARE_PROVIDER_SITE_OTHER): Payer: Medicare Other

## 2021-06-15 ENCOUNTER — Encounter (HOSPITAL_COMMUNITY): Payer: Self-pay | Admitting: *Deleted

## 2021-06-15 ENCOUNTER — Other Ambulatory Visit: Payer: Self-pay

## 2021-06-15 DIAGNOSIS — S6992XA Unspecified injury of left wrist, hand and finger(s), initial encounter: Secondary | ICD-10-CM | POA: Diagnosis not present

## 2021-06-15 DIAGNOSIS — M79645 Pain in left finger(s): Secondary | ICD-10-CM

## 2021-06-15 NOTE — ED Provider Notes (Signed)
[facility]    CSN: [id number] Arrival date & time: 06/15/21  1559      History   Chief Complaint Chief Complaint  Patient presents with   Finger Injury    HPI Kathleen [name] is a 55 y.o. female.   Left 1st finger dislocating 1.5 weeks ago, patient caught her left first finger with her car door at the grocery She states that it dislocated at her PIP and she was able to put it back in place It has continued to dislocate since then and has happened multiple times in the last week and a half She denies any pain other than when it dislocates She denies difficulty using her finger   Patient is a dialysis patient and reports that her blood pressure has been low at home, therefore she held her blood pressure medicine this morning She states that she does not have any chest pain, difficulty breathing.  She makes a small amount of urine and this has not changed.  No new back pain.  Otherwise feeling well.   Past Medical History:  Diagnosis Date   Anemia    iron def,    Chronic kidney disease    Hemodialysis patient [facility])    Hypertension    Kidney transplant recipient    Rheumatoid arthritis of the hand     Patient Active Problem List   Diagnosis Date Noted   Bell's palsy    Herpes zoster    Disseminated zoster 11/28/2015   Varicella 11/28/2015   Anemia of chronic kidney failure 11/28/2015   Accelerated hypertension 11/28/2015   Hyperkalemia 11/28/2015   Thyroid nodule 11/29/2013   Candidal skin infection 05/17/2012   Hypertension 05/17/2012   Rheumatoid arthritis of the hand 07/21/2011   Zoster 07/21/2011   Kidney replaced by transplant 07/21/2011   ANEMIA, OTHER, UNSPECIFIED 01/18/2007   RHINITIS, ALLERGIC 01/18/2007   CONSTIPATION 01/18/2007   AMENORRHEA 01/18/2007    Past Surgical History:  Procedure Laterality Date   CESAREAN SECTION  1992   HAND SURGERY Right [id number]   silicone where the joints 3 MCP joints   kiney transplant  2005, 2013     x 2    OB History   No obstetric history on file.      Home Medications    Prior to Admission medications   Medication Sig Start Date End Date Taking? Authorizing Provider  acetaminophen (TYLENOL) 500 MG tablet Take 650 mg by mouth every 6 (six) hours as needed for moderate pain.     [provider]  amitriptyline (ELAVIL) 25 MG tablet Take 25 mg by mouth daily. Patient not taking: Reported on 10/16/2020 11/08/18   [provider]  ARTIFICIAL TEAR OP Place 1 drop into both eyes in the morning, at noon, in the evening, and at bedtime.    [provider]  BISACODYL PO Take by mouth.    [provider]  cetirizine (ZYRTEC) 10 MG tablet Take 10 mg by mouth daily. 12/07/18   [provider]  diclofenac Sodium (VOLTAREN) 1 % GEL Apply 2 g topically daily as needed (For feet pain).    [provider]  gabapentin (NEURONTIN) 100 MG capsule Take 100 mg by mouth at bedtime. Take 3 capsules 12/15/18   [provider]  ipratropium (ATROVENT) 0.06 % nasal spray Place 2 sprays into both nostrils 4 (four) times daily.    [provider]  labetalol (NORMODYNE) 100 MG tablet Take 300 mg by mouth 3 (three)  times daily.     [provider]  ondansetron (ZOFRAN-ODT) 4 MG disintegrating tablet Take 4 mg by mouth as needed. 10/15/18   [provider]  predniSONE (DELTASONE) 5 MG tablet Take 5 mg by mouth daily with breakfast.    [provider]  RENVELA 0.8 g PACK packet Take 0.8 g by mouth 3 (three) times daily with meals. Takes 3 packets packs 3 times daily with meals and take 2 packs with snacks 12/14/18   [provider]  vitamin B-12 (CYANOCOBALAMIN) 1000 MCG tablet Take 1,000 mcg by mouth daily. 02/27/18   [provider]    Family History Family History  Problem Relation Age of Onset   Healthy Mother    Diabetes Maternal Grandmother    Cancer Maternal Grandmother 85   Breast cancer  Neg Hx     Social History Social History   Tobacco Use   Smoking status: Never   Smokeless tobacco: Never  Vaping Use   Vaping Use: Never used  Substance Use Topics   Alcohol use: Yes    Alcohol/week: 0.0 standard drinks    Comment: occasional   Drug use: No     Allergies   Adhesive [tape], Benazepril, and Magnesium-containing compounds   Review of Systems Review of Systems  Constitutional:  Negative for activity change, appetite change, chills and fever.  Eyes:  Negative for visual disturbance.  Respiratory:  Negative for cough and shortness of breath.   Cardiovascular:  Negative for chest pain.  Gastrointestinal:  Negative for abdominal pain and vomiting.  Genitourinary:  Negative for decreased urine volume, difficulty urinating (no change, urinates small amount at baseline) and flank pain.  Skin:  Negative for color change and wound.    Physical Exam Triage Vital Signs ED Triage Vitals  Enc Vitals Group     BP 06/15/21 1702 (!) 171/109     Pulse Rate 06/15/21 1702 98     Resp 06/15/21 1702 16     Temp 06/15/21 1702 98.3 F (36.8 C)     Temp src --      SpO2 06/15/21 1702 96 %     Weight --      Height --      Head Circumference --      Peak Flow --      Pain Score 06/15/21 1705 7     Pain Loc --      Pain Edu? --      Excl. in [place]? --    No data found.  Updated Vital Signs BP (!) 171/109   Pulse 98   Temp 98.3 F (36.8 C)   Resp 16   LMP 01/10/2015   SpO2 96%   Visual Acuity Right Eye Distance:   Left Eye Distance:   Bilateral Distance:    Right Eye Near:   Left Eye Near:    Bilateral Near:     Physical Exam Constitutional:      Appearance: Normal appearance.  Eyes:     Extraocular Movements: Extraocular movements intact.     Pupils: Pupils are equal, round, and reactive to light.  Cardiovascular:     Rate and Rhythm: Normal rate.  Pulmonary:     Effort: Pulmonary effort is normal.     Breath sounds: Normal breath sounds.   Musculoskeletal:     Comments: She has some ulnar deviation of her fingers and prominent DIP and PIPs  Her left first finger has small amount of swelling without overlying skin  changes, no TTP of finger throughout, full ROM at DIP and PIP of left first finger, sensation intact  2+ radial pulses  Skin:    General: Skin is warm and dry.  Neurological:     General: No focal deficit present.     Mental Status: She is alert and oriented to person, place, and time.     UC Treatments / Results  Labs (all labs ordered are listed, but only abnormal results are displayed) Labs Reviewed - No data to display  EKG   Radiology DG Finger Index Left  Result Date: 06/15/2021 CLINICAL DATA:  Left index finger dislocation. EXAM: LEFT INDEX FINGER 2+V COMPARISON:  None. FINDINGS: There is no evidence of fracture or dislocation. There is no evidence of arthropathy or other focal bone abnormality. Soft tissues are unremarkable. IMPRESSION: Negative. Electronically Signed   By: Marijo Conception M.D.   On: 06/15/2021 19:18    Procedures Procedures (including critical care time)  Medications Ordered in UC Medications - No data to display  Initial Impression / Assessment and Plan / UC Course  I have reviewed the triage vital signs and the nursing notes.  Pertinent labs & imaging results that were available during my care of the patient were reviewed by me and considered in my medical decision making (see chart for details).    Patient is a 55 year old female with history of ESRD on HD, next HD tomorrow, hypertension, who presents with left first finger pain after getting it caught on a door.  Does not appear to have a crush injury, but has been having recurrent dislocations per her report.  Overall she has rather lax joints throughout, which she states she has noticed since starting dialysis.  Dislocation is not provoked on examination.  Range of motion is preserved at DIP and PIP in this finger.  She  has no overlying concerns.  X-ray was performed today without any evidence of fracture.  Given her recurrent dislocations, will refer to a hand specialist and she can follow-up with them as needed.  Advised to return to care if she has dislocation significant pain, loses the ability to bend her finger at either joint, develops swelling in redness, develops fever.   In regards to hypertension, she is scheduled for dialysis tomorrow.  She does not have any signs or symptoms concerning for hypertensive emergency to warrant further evaluation or management.  She was advised to go to the ED if she develops chest pain, difficulty breathing, changes in urine amount, flank pain, severe headache, focal weakness, difficulty with speach, or facial droop.  She was discharged home in stable condition.    Final Clinical Impressions(s) / UC Diagnoses   Final diagnoses:  Injury of finger of left hand, initial encounter     Discharge Instructions      You do not have a fracture and your exam does not show any concerns currently.  If you keep having your finger pop out of place, you should call the hand surgeon for further follow up.  In regards to your blood pressure, make sure you go to dialysis tomorrow.  If you start to have chest pain, shortness of breath, decreased urination, back pain that is on either side and in mid back, weakness on one side of the body of the other, facial droop, or slurring of speech, you need to go to the emergency room right away.     ED Prescriptions   None    PDMP not reviewed this  encounter.   Cleophas Dunker, DO 06/15/21 1921

## 2021-06-15 NOTE — ED Triage Notes (Signed)
Pt reports finger injury to Lt index finger 2 weeks ago. Pt reports she bent finger back and now the finger gets stuck and she has to pull the finger to straighten .

## 2021-06-15 NOTE — Discharge Instructions (Signed)
You do not have a fracture and your exam does not show any concerns currently.  If you keep having your finger pop out of place, you should call the hand surgeon for further follow up.  In regards to your blood pressure, make sure you go to dialysis tomorrow.  If you start to have chest pain, shortness of breath, decreased urination, back pain that is on either side and in mid back, weakness on one side of the body of the other, facial droop, or slurring of speech, you need to go to the emergency room right away.

## 2021-10-06 ENCOUNTER — Telehealth: Payer: Self-pay | Admitting: Interventional Cardiology

## 2021-10-06 NOTE — Telephone Encounter (Signed)
New Message:    Patient says she would like for Dr Tamala Julian to please refer her to an EP doctor. She saids she wants somebody exactly like him, thorough and honest.

## 2021-10-06 NOTE — Telephone Encounter (Signed)
Called pt and advised that she has not been seen in the office so we would not be able to place a referral to our EP dept.  Pt states she just wants a name from Dr. Tamala Julian on who she should see.  Pt states she told her PCP that she has an arrhythmia and wanted a referral to EP.  Advised pt to have PCP put the referral in and the EP dept can get her scheduled with the appropriate doctor based on what is going on with her.  She still wanted Dr. Tamala Julian to make a recommendation.  Advised that we can't make recommendations on who she should see because we don't know what is going on with her cardiac wise.  Advised to have PCP send over records so that we can get her to the proper doctor.  Pt not satisfied with answer but states she will research our team and decide if she wants to come here or not.

## 2021-10-08 ENCOUNTER — Telehealth: Payer: Self-pay

## 2021-10-08 NOTE — Telephone Encounter (Signed)
NOTES SCANNED TO REFERRAL
